# Patient Record
Sex: Female | Born: 1940
Health system: Southern US, Community
[De-identification: ages and names within clinical notes are randomized; demographics above are authoritative.]

## PROBLEM LIST (undated history)

## (undated) ENCOUNTER — Ambulatory Visit: Admission: EM | Payer: PPO

## (undated) DIAGNOSIS — N950 Postmenopausal bleeding: Secondary | ICD-10-CM

## (undated) DIAGNOSIS — J40 Bronchitis, not specified as acute or chronic: Secondary | ICD-10-CM

## (undated) DIAGNOSIS — K219 Gastro-esophageal reflux disease without esophagitis: Secondary | ICD-10-CM

## (undated) DIAGNOSIS — I480 Paroxysmal atrial fibrillation: Secondary | ICD-10-CM

## (undated) DIAGNOSIS — D649 Anemia, unspecified: Secondary | ICD-10-CM

## (undated) DIAGNOSIS — Z8639 Personal history of other endocrine, nutritional and metabolic disease: Secondary | ICD-10-CM

## (undated) DIAGNOSIS — N390 Urinary tract infection, site not specified: Secondary | ICD-10-CM

## (undated) DIAGNOSIS — Z972 Presence of dental prosthetic device (complete) (partial): Secondary | ICD-10-CM

## (undated) DIAGNOSIS — E039 Hypothyroidism, unspecified: Secondary | ICD-10-CM

## (undated) DIAGNOSIS — D509 Iron deficiency anemia, unspecified: Secondary | ICD-10-CM

## (undated) DIAGNOSIS — M62830 Muscle spasm of back: Secondary | ICD-10-CM

## (undated) DIAGNOSIS — M1990 Unspecified osteoarthritis, unspecified site: Secondary | ICD-10-CM

## (undated) DIAGNOSIS — I517 Cardiomegaly: Secondary | ICD-10-CM

## (undated) DIAGNOSIS — R0600 Dyspnea, unspecified: Secondary | ICD-10-CM

## (undated) DIAGNOSIS — R630 Anorexia: Secondary | ICD-10-CM

## (undated) DIAGNOSIS — I10 Essential (primary) hypertension: Secondary | ICD-10-CM

## (undated) DIAGNOSIS — Z7901 Long term (current) use of anticoagulants: Secondary | ICD-10-CM

## (undated) DIAGNOSIS — E079 Disorder of thyroid, unspecified: Secondary | ICD-10-CM

## (undated) DIAGNOSIS — J189 Pneumonia, unspecified organism: Secondary | ICD-10-CM

## (undated) HISTORY — DX: Cardiomegaly: I51.7

## (undated) HISTORY — PX: EYE SURGERY: SHX253

## (undated) HISTORY — PX: MULTIPLE TOOTH EXTRACTIONS: SHX2053

## (undated) HISTORY — PX: DILATION AND CURETTAGE OF UTERUS: SHX78

## (undated) HISTORY — DX: Personal history of other endocrine, nutritional and metabolic disease: Z86.39

## (undated) HISTORY — DX: Paroxysmal atrial fibrillation: I48.0

---

## 2004-10-13 ENCOUNTER — Ambulatory Visit: Payer: Self-pay | Admitting: Cardiology

## 2005-09-30 ENCOUNTER — Emergency Department (HOSPITAL_COMMUNITY): Admission: EM | Admit: 2005-09-30 | Discharge: 2005-09-30 | Payer: Self-pay | Admitting: Emergency Medicine

## 2006-02-19 ENCOUNTER — Ambulatory Visit: Payer: Self-pay | Admitting: Cardiology

## 2006-02-20 ENCOUNTER — Ambulatory Visit: Payer: Self-pay | Admitting: Cardiology

## 2006-05-20 ENCOUNTER — Ambulatory Visit: Payer: Self-pay | Admitting: Cardiology

## 2006-05-28 ENCOUNTER — Ambulatory Visit: Payer: Self-pay | Admitting: Cardiology

## 2006-06-27 ENCOUNTER — Ambulatory Visit: Payer: Self-pay | Admitting: Cardiology

## 2006-07-12 ENCOUNTER — Ambulatory Visit: Payer: Self-pay | Admitting: Cardiology

## 2006-08-20 ENCOUNTER — Ambulatory Visit: Payer: Self-pay | Admitting: Cardiology

## 2006-09-03 ENCOUNTER — Ambulatory Visit: Payer: Self-pay | Admitting: Cardiology

## 2006-09-09 ENCOUNTER — Ambulatory Visit: Payer: Self-pay | Admitting: Cardiology

## 2006-09-18 ENCOUNTER — Ambulatory Visit: Payer: Self-pay | Admitting: Cardiology

## 2007-07-21 ENCOUNTER — Ambulatory Visit: Payer: Self-pay | Admitting: Cardiology

## 2008-08-04 ENCOUNTER — Encounter: Payer: Self-pay | Admitting: Cardiology

## 2008-08-04 ENCOUNTER — Ambulatory Visit: Payer: Self-pay | Admitting: Cardiology

## 2008-08-04 DIAGNOSIS — E039 Hypothyroidism, unspecified: Secondary | ICD-10-CM

## 2008-08-04 DIAGNOSIS — I4891 Unspecified atrial fibrillation: Secondary | ICD-10-CM

## 2008-08-11 ENCOUNTER — Ambulatory Visit: Payer: Self-pay | Admitting: Cardiology

## 2008-08-20 ENCOUNTER — Encounter: Payer: Self-pay | Admitting: Cardiology

## 2008-08-20 ENCOUNTER — Ambulatory Visit: Payer: Self-pay | Admitting: Cardiology

## 2008-08-20 DIAGNOSIS — H9319 Tinnitus, unspecified ear: Secondary | ICD-10-CM | POA: Insufficient documentation

## 2009-04-22 ENCOUNTER — Encounter: Payer: Self-pay | Admitting: Cardiology

## 2009-04-22 ENCOUNTER — Ambulatory Visit: Payer: Self-pay | Admitting: Cardiology

## 2011-02-06 NOTE — Assessment & Plan Note (Signed)
[facility]                          [facility]   KAMALJIT, HIZER                    MRN:          [id number]  DATE:07/21/2007                            DOB:          [birth year]    HISTORY OF PRESENT ILLNESS:  The patient is a pleasant 70 year old  female with a history of paroxysmal atrial fibrillation.  The patient  was last seen September 03, 2006, at which time we increased her  Pindolol.  Unfortunately, with the increase of Pindolol she developed an  episode of atrial fibrillation.  She was seen in the office.  An EKG was  obtained and confirmed the presence of atrial fibrillation.  The rate  was, however, well-controlled.  The patient then stopped taking her  Pindolol altogether, only later on to resume at a half dose.  She states  she has now been doing well.  She reports no recurrent palpitations.  She actually has had not a single episode since December 2007.  She  denies any chest pain, shortness of breath, orthopnea, PND, and her main  complaint is insomnia as it relates to working a third shift.   MEDICATIONS:  1. Synthroid 0.1 mg p.o. daily.  2. Aspirin 325 mg p.o. daily.  3. Lanoxin 25 mcg p.o. daily.  4. Pindolol 5 mg half a tablet p.o. daily.   PHYSICAL EXAMINATION:  VITAL SIGNS:  Blood pressure 133/75, heart rate  55 beats per minute, weight is 135 pounds.  NECK:  Normal carotid upstroke and no carotid bruits.  LUNGS:  Clear breath sounds bilaterally.  HEART:  Regular rate and rhythm with normal S1-S2, no murmurs, rubs or  gallops.  ABDOMEN:  Soft, nontender.  No rebound or guarding.  Good bowel sounds.  EXTREMITIES:  No cyanosis, clubbing or edema.  NEUROLOGIC:  The patient is alert and oriented and grossly nonfocal.   PROBLEM LIST:  1. Paroxysmal atrial fibrillation.  2. No definite indication for long-term Coumadin anticoagulation      secondary to low CHADS score.  3. Normal left ventricular  function.  4. History of hypothyroidism.   PLAN:  1. The patient can continue on low-dose Pindolol.  She remains in      normal sinus rhythm.  2. At this point there is still no indication for Coumadin, and this      was discussed with the      patient.  3. The patient can follow up with [place] in 6 months.     Learta Codding, MD,FACC  Electronically Signed    GED/MedQ  DD: 07/21/2007  DT: 07/22/2007  Job #: [id number]

## 2011-02-09 NOTE — Assessment & Plan Note (Signed)
[facility]                          [facility]   Debbie Rubio, Debbie Rubio                    MRN:          [id number]  DATE:09/03/2006                            DOB:          1941/02/14    PRESENT ILLNESS:  The patient is a 70 year old female with a history of  paroxysmal mitral fibrillation.  Appeared the patient was last seen in  the office on August 05, 2006.  The patient concerns regarding side  effects of medications, __________ of hypotension secondary to beta  blockers and diltiazem.  A recommendation was given at that time of the  office visit to change to Bentylol half a tablet p.o. b.i.d.  The  patient is taking 2.5 mg once a day.  She has had marked improvement in  her symptoms.  She states she has absolutely had no palpitations in the  mornings, and has occasional palpitations in the evenings, but nothing  prolonged enough to make her concerned.   She reports no chest pain, orthopnea, or syncope.   1. Synthroid 0.1 mg p.o. daily.  2. Ditropan, aspirin 825 p.o. daily.  3. Bentylol 2.5 mg half a tablet daily.  4. Lanoxin 25 mcg p.o. daily.   PHYSICAL EXAMINATION:  Blood pressure 122/71, heart rate 62 beats per  minute.  Weight is 127 pounds.  NECK:  Normal carotid upstrokes.  No carotid bruits.  LUNGS:  Clear breath sounds bilaterally.  HEART:  Regular rate and rhythm, normal S1, S2.  No murmurs, rubs, or  gallops.  GENERAL:  Well-nourished white female in no acute distress.  HEENT:  Normal, no carotid bruits.  ABDOMEN:  Soft.  EXTREMITIES: No edema.  NEURO:  Patient is nonfocal.   PROBLEMS:  1. Paroxysmal atrial fibrillation, acquiescent on Bentylol therapy.  2. No definite indication for long term Coumadin anticoagulation.  3. Normal left ventricular function.  4. History of hypothyroidism.   PLAN:  1. The patient is doing well from an arrhythmia standpoint.  She has      no recurrent palpitations      of  any significance.  2. The patient is now willing to increase her Bentylol to 2.5 mg p.o.      b.i.d.     Learta Codding, MD,FACC  Electronically Signed    GED/MedQ  DD: 09/03/2006  DT: 09/03/2006  Job #: [id number]   cc:   Donzetta Sprung

## 2011-02-09 NOTE — Assessment & Plan Note (Signed)
[facility]                            [facility]   Debbie Rubio, Debbie Rubio                    MRN:          [id number]  DATE:06/27/2006                            DOB:          1941-01-15    PRIMARY CARDIOLOGIST:  Dr. Nona Dell.   REASON FOR OFFICE VISIT:  Scheduled office follow up. Please refer to  Carolynne Edouard' office note of August 27th for full details.   In summary, Debbie Rubio returns after recent clinic follow up at which time  she was referred for a 2D echocardiogram and a digoxin level.  The  echocardiogram showed continued normal left ventricular function with only  mild mitral regurgitation; no wall motion abnormalities noted.   The patient has a history of documented paroxysmal atrial fibrillation, but  was in NSR when seen here in the clinic.   The patient continues to report occasional episodes of palpitations and, in  fact, has had two since this recent clinic visit. In fact, her second  episode led her to returning to [facility] ER on Saturday, September  29th. However, upon arrival she had already reverted back to normal sinus  rhythm.   The patient does not state that these are of the fluttering sensation, but  more of an irregular heart beat. As noted, she has had documented PAF in the  past and, in fact, was treated with propafenone 600 mg in September, 2005.  She apparently was also treated with diltiazem at that time, but was not  discharged on this presumably secondary to hypotension.   The patient also reports intolerance to beta blockers, stating that this  also drops her blood pressure.  The patient does have treated hypothyroidism and states that her TSH levels  have been normal, and are closely followed by Dr. Reuel Boom.   During her recent ER visit, all blood work was within normal limits,  including a digoxin level of 1.2. The patient does not drink caffeinated  beverages.   CURRENT MEDICATIONS:  1. Synthroid 0.1 daily.  2. Ditropan XL 10 daily.  3. Enteric coated aspirin 325 daily.  4. Lanoxin 0.25 daily.   PHYSICAL EXAMINATION:  VITAL SIGNS:  Blood pressure 110/54, pulse 60,  regular. Weight 124.  NECK:  Palpable bilateral carotid pulses without bruits.  LUNGS:  Clear to auscultation in all fields.  HEART:  Regular rate and rhythm (S1, S2) no murmurs, rubs, or gallops.  EXTREMITIES:  Intact pulses without edema.  NEURO:  No focal deficit.   IMPRESSION:  1. History of paroxysmal atrial fibrillation with rapid ventricular      response.      a.     Previously successfully treated with propafenone.      b.     Intolerant to beta blockers and diltiazem, presumably secondary       to associated hypotension.      c.     Documented PAF by Holter monitoring, May, 2007.  2. Normal left ventricular function.      a.     Mild mitral regurgitation.  3. Treated hypothyroidism.   PLAN:  I reviewed Ms. Huizinga's past history with Dr. Andee Lineman, including  most recent presentation to the ER for recurrent palpitations at which time  she was already back in normal sinus rhythm.  We also reviewed her recent  echocardiogram, again read by Dr. Andee Lineman, showing continued preserved left  ventricular function.  All of her recent blood work was also within normal  limits.   RECOMMENDATIONS:  Therefore recommendation is to re-challenge her with  calcium channel blocker at a low dose of 120 daily. This will be in addition  to the digoxin that she is currently on. Will also place her on a CardioNet  monitor for the next 3 weeks to see how her heart rate responds on the added  medication. If she continues to have breakthrough tachy palpitations or  documented atrial fibrillation, then we will consider her for long term  propafenone treatment. However, she will then need an ischemic work up,  which she has never had in the past.      ______________________________  Rozell Searing, PA-C    ______________________________  Learta Codding, MD,FACC    GS/MedQ  DD:  06/27/2006  DT:  06/28/2006  Job #:  [id number]   cc:   Jonelle Sidle, MD

## 2011-02-09 NOTE — Assessment & Plan Note (Signed)
[facility]                          [facility]   SHAQUANDRA, GALANO                    MRN:          [id number]  DATE:08/20/2006                            DOB:          Nov 11, 1940    REASON FOR OFFICE VISIT:  Scheduled follow up.  Please refer to my  office note of October 4 for full details.   At that time, the patient presented in follow up of further workup  consisting of an echocardiogram and blood work.  The echocardiogram  revealed normal left ventricular function with mild mitral regurgitation  and no wall motion abnormalities.  The patient also had a digoxin level  drawn which was normal.  This was all with respect to long-standing  history of palpitations with previously documented PAF, but with no  indication for long-term treatment with Coumadin.  At the time of our  last visit, we recommended re-challenging the patient with Cardizem for  better management of her break through palpitations.  However, she never  did start the Cardizem 120 daily given her significant anxiety related  to this medication in light of reportedly adverse affect to this  medication when she was previously here in the hospital.  She still  feels quite strongly that she had a significant hypertensive reaction to  calcium channel blocker in the past.  However, both at the time of her  last visit as well as currently, I reviewed her chart extensively and  did find an indication of hypotension, but this may, in fact, have been  related to her being treated with a one time dose of propafenone in  September 2005.  It remains unclear to me as to exactly what caused the  drop in blood pressure.  It is, however, clear that she did have  relative hypotension and was not discharged on a calcium channel blocker  nor on a beta blocker.   We also recommended she be placed on a cardiac event monitor for  reassessment of her break through tachy  palpitations and to rule out  recurrent PAF.  Of note, she did have documented PAF by Holter  monitoring in May of this year.  The CardioNet results have been  reviewed.  They reveal only two brief runs of PSVT at a rate of 105  which then converted to sinus bradycardia with PACs.  She, otherwise,  remains in sinus brady/normal sinus rhythm with rates as low as 36 and  as high as 105.   From a clinical standpoint, she reports no significant associated  symptoms during the period that she was on the CardioNet monitor.   CURRENT MEDICATIONS:  Lanoxin 0.25 daily, coated aspirin 325 daily,  Synthroid 0.1 daily, Ditropan XL 10 daily.   PHYSICAL EXAMINATION:  VITAL SIGNS:  Blood pressure 122/71, pulse 52 and regular, weight 128.  GENERAL:  70 year old female sitting upright in no apparent distress.  HEENT:  Normocephalic, atraumatic.  NECK:  Palpable bilateral carotid pulses without bruits.  LUNGS:  Clear to auscultation in all fields.  HEART:  Regular rate and rhythm (S1 and S2), no murmurs, gallops, and  rubs.  ABDOMEN:  Soft, nontender.  EXTREMITIES:  Intact distal pulses without edema.  NEUROLOGICAL:  No focal deficit.   IMPRESSION:  1. Recurrent tachy palpitations.      a.     Mild dissociated symptoms.      b.     History of documented PAF by Holter monitor in May 2007.      c.     No indication for long term Coumadin anticoagulation.      d.     Status post successful conversion of PAF to NSR after       treatment with propafenone in September 2005.      e.     Intolerant to beta blockers and Diltiazem secondary to       relative hypotension.  2. Normal left ventricular function.      a.     Mild mitral regurgitation by current echocardiogram.  3. Treated hypothyroidism.   PLAN:  Following extensive review with Dr. Andee Lineman regarding the  patient's past history of documented PAF, recurrent occasional tachy  palpitations, current CardioNet monitor results, and in particularly  in  light of her sensitivity to calcium channel and beta blockers, the  current recommendation is to try a beta blocker with ISA properties.  Therefore, we will add Pindolol at the low dose of 2.5 mg initially to  be taken once a day.  If the patient tolerates this for 3-4 days, she  can then increase it to b.i.d. dosing.  This is to be added to her  current regimen which includes Lanoxin.  We will plan on having her  return to our clinic in two weeks for close monitoring and further  recommendations.  If, however, the patient is unable to tolerate this  medication, then we will consider referring her to our electrophysiology  team for further evaluation and recommendations.      Gene Serpe, PA-C  Electronically Signed      Learta Codding, MD,FACC  Electronically Signed   GS/MedQ  DD: 08/20/2006  DT: 08/20/2006  Job #: [id number]   cc:   Donzetta Sprung

## 2011-02-09 NOTE — Assessment & Plan Note (Signed)
[facility]                            [facility]   SHAUGHNESSY, GETHERS                    MRN:          [id number]  DATE:05/20/2006                            DOB:          19-Jul-1941    REASON FOR VISIT:  Follow-up atrial fibrillation.   HISTORY OF PRESENT ILLNESS:  Debbie Rubio is a very pleasant 70 year old  Caucasian female with known history of paroxysmal atrial fibrillation with  rapid ventricular response.  We initially saw her back in September of 2005  for this reason.  She was admitted to [facility] at that  time and treated with propafenone 600 mg.  The patient converted to normal  sinus rhythm.  She did experience some hypotension and had to be fluid  bolused.  They considered at that time putting her on a calcium channel  blocker or beta blocker, but the patient's blood pressure was too low and  she did not tolerate either of these medications.  Dr. Nona Dell did  not feel the patient needed Coumadin at that time, as she had no  hypertension, diabetes, congestive heart failure, strokes, and she was less  than 29 years old.  She was placed on aspirin daily, encouraged to stay off  caffeine, and to avoid Sudafed-type products.  It was felt if she had  recurrent atrial fibrillation, she may need electrophysiology study and  possible ablation.  Debbie Rubio was seen since that time in January of  2006 for further follow-up.  She had no major recurrences of atrial  fibrillation at that time, and the patient was instructed to follow up as  needed.  Apparently since that time (there are no office notes available),  the patient was ordered a Holter monitor by Burlene Arnt in May of this year.  Holter monitor showed sinus rhythm with periods of atrial fibrillation noted  with spontaneous conversion to normal sinus rhythm.  The patient was also  found to have PVC and some SVT noted in the form of  singles, pairs, and  atrial runs.  The patient states she has been doing well since that time  until July when she was out of town on vacation and states that she had what  felt like a burst of atrial fibrillation on March 27, 2006, and again on April 04, 2006.  She states she had not consumed any caffeine or Sudafed-type  products.  She had consumed a very small amount of alcohol.  Apparently the  irregular rate did not last more than 30 minutes.  The patient states she  laid down and rested and it resolved spontaneously.  She returns today for  further evaluation.  She has denied any episodes of chest discomfort,  presyncope or syncope, lightheadedness or dizziness.  The only time she felt  anything was on March 27, 2006, and April 04, 2006, when she felt like my  whole body was shaking with the atrial fibrillation.  She did not seek  medical assistance at that time.  She denies any episodes of orthopnea, PND,  peripheral edema.  Debbie Rubio is  a very active 70 year old female.  She  states she is very sensitive to medications and has been all her life and  does not tolerate high doses of any medications.  She also has a history of  hypothyroidism and is maintained on Synthroid 0.1 mg daily.  She is  maintained on Lanoxin 25 mcg daily for her atrial fibrillation.   REVIEW OF SYSTEMS:  As stated above, otherwise negative.   MEDICATIONS:  1. Synthroid 0.1 mg.  2. Ditropan XL 10 mg.  3. Aspirin 325.  4. Lanoxin 25 mcg.  5. Ultram p.r.n.   PAST MEDICAL HISTORY:  Past medical history includes:  1. Paroxysmal atrial fibrillation with rapid ventricular response, first      diagnosed in September of 2005.      a.     Echocardiogram at that time showed left ventricular hypertrophy       with preserved LV systolic function.  Biatrial enlargement.  Mild       mitral regurgitation.  2. Hypothyroidism.  3. Urge urinary incontinence.  4. Gastroesophageal reflux disease.  5. Palpitations.  6.  Intolerance to beta blockers, questionable intolerance to calcium      channel blockers.  7. Very remote history of tobacco use.   PHYSICAL EXAMINATION:  VITAL SIGNS:  Blood pressure 110/70, heart rate 74,  weight 127 pounds.  GENERAL:  Patient in no acute distress, very pleasant and cooperative 53-  year-old Caucasian female.  NECK:  Supple without lymphadenopathy, negative bruits, negative JVD.  CHEST:  Clear to auscultation bilaterally.  CARDIOVASCULAR:  S1/S2 regular rate and rhythm.  ABDOMEN:  Soft, nontender.  Positive bowel sounds.  EXTREMITIES:  Lower extremities without clubbing, cyanosis, or edema.  NEUROLOGIC:  The patient is alert and oriented x 3.  Cranial nerves II-XII  grossly intact.   LABORATORY DATA:  Twelve-lead EKG showing sinus bradycardia at a rate of 55  without acute ST or T-wave changes.   IMPRESSION:  1. Paroxysmal atrial fibrillation, 12-lead EKG today showing sinus      bradycardia at a rate of 55, patient currently being maintained on      digoxin only with intolerance to beta blockers and possible intolerance      to calcium channel blockers, per patient.  I cannot find that the      patient has ever had a workup for ischemic disease.  When we saw the      patient in consultation in September of 2005, it was noted that the      patient would be scheduled for an outpatient stress test.  Mrs.      Rubio does not recall this ever being ordered, and I do not have any      results to confirm that one was done.  2-D echocardiogram was done that      showed a normal EF with mild mitral regurgitation.  At this time, Mrs.      Rubio does not want to add any medications to her medical regimen.      She states she has not had any further episodes of palpitations since      July.  I would like to repeat her echocardiogram and she is agreeable      to this.  We will also check a digoxin level and a TSH level.  I do not     think that we would be able to  increase her digoxin as her rate is  running in the 50s now and apparently the patient does not tolerate      beta blockers and does not wish to attempt to try Cardizem again at      this time.  We will repeat her echocardiogram, check her blood work,      and see her back in four weeks.  If she has any further episodes of the      PAF, we can strongly encourage her      to try the Cardizem again.  We also need to consider a stress test,      possible EP evaluation.  However, at this time, she does not want to      proceed with any further testing.                                   Dorian Pod, ACNP                                Learta Codding, MD, [facility]   MB/MedQ  DD:  05/20/2006  DT:  05/21/2006  Job #:  [id number]

## 2012-04-04 ENCOUNTER — Encounter: Payer: Self-pay | Admitting: *Deleted

## 2015-11-23 DIAGNOSIS — J189 Pneumonia, unspecified organism: Secondary | ICD-10-CM

## 2015-11-23 HISTORY — DX: Pneumonia, unspecified organism: J18.9

## 2015-12-02 ENCOUNTER — Inpatient Hospital Stay (HOSPITAL_COMMUNITY)
Admission: EM | Admit: 2015-12-02 | Discharge: 2015-12-04 | DRG: 194 | Disposition: A | Discharge status: Home / Self Care | Payer: PPO | Attending: Internal Medicine | Admitting: Internal Medicine

## 2015-12-02 ENCOUNTER — Emergency Department (HOSPITAL_COMMUNITY): Payer: PPO

## 2015-12-02 ENCOUNTER — Encounter (HOSPITAL_COMMUNITY): Payer: Self-pay | Admitting: Emergency Medicine

## 2015-12-02 DIAGNOSIS — Z7951 Long term (current) use of inhaled steroids: Secondary | ICD-10-CM

## 2015-12-02 DIAGNOSIS — E876 Hypokalemia: Secondary | ICD-10-CM | POA: Diagnosis not present

## 2015-12-02 DIAGNOSIS — Z7982 Long term (current) use of aspirin: Secondary | ICD-10-CM | POA: Diagnosis not present

## 2015-12-02 DIAGNOSIS — E039 Hypothyroidism, unspecified: Secondary | ICD-10-CM | POA: Diagnosis not present

## 2015-12-02 DIAGNOSIS — J101 Influenza due to other identified influenza virus with other respiratory manifestations: Secondary | ICD-10-CM | POA: Diagnosis present

## 2015-12-02 DIAGNOSIS — R509 Fever, unspecified: Secondary | ICD-10-CM | POA: Diagnosis not present

## 2015-12-02 DIAGNOSIS — Z8249 Family history of ischemic heart disease and other diseases of the circulatory system: Secondary | ICD-10-CM | POA: Diagnosis not present

## 2015-12-02 DIAGNOSIS — R778 Other specified abnormalities of plasma proteins: Secondary | ICD-10-CM | POA: Diagnosis present

## 2015-12-02 DIAGNOSIS — R7989 Other specified abnormal findings of blood chemistry: Secondary | ICD-10-CM | POA: Diagnosis present

## 2015-12-02 DIAGNOSIS — J189 Pneumonia, unspecified organism: Secondary | ICD-10-CM | POA: Diagnosis not present

## 2015-12-02 DIAGNOSIS — R6883 Chills (without fever): Secondary | ICD-10-CM | POA: Diagnosis not present

## 2015-12-02 DIAGNOSIS — I10 Essential (primary) hypertension: Secondary | ICD-10-CM | POA: Diagnosis present

## 2015-12-02 DIAGNOSIS — R05 Cough: Secondary | ICD-10-CM | POA: Diagnosis not present

## 2015-12-02 DIAGNOSIS — I480 Paroxysmal atrial fibrillation: Secondary | ICD-10-CM | POA: Diagnosis not present

## 2015-12-02 DIAGNOSIS — J1000 Influenza due to other identified influenza virus with unspecified type of pneumonia: Principal | ICD-10-CM | POA: Diagnosis present

## 2015-12-02 DIAGNOSIS — I4891 Unspecified atrial fibrillation: Secondary | ICD-10-CM | POA: Diagnosis not present

## 2015-12-02 DIAGNOSIS — N179 Acute kidney failure, unspecified: Secondary | ICD-10-CM | POA: Diagnosis not present

## 2015-12-02 DIAGNOSIS — I482 Chronic atrial fibrillation: Secondary | ICD-10-CM | POA: Diagnosis present

## 2015-12-02 DIAGNOSIS — I248 Other forms of acute ischemic heart disease: Secondary | ICD-10-CM | POA: Diagnosis not present

## 2015-12-02 DIAGNOSIS — J989 Respiratory disorder, unspecified: Secondary | ICD-10-CM

## 2015-12-02 HISTORY — DX: Essential (primary) hypertension: I10

## 2015-12-02 LAB — COMPREHENSIVE METABOLIC PANEL
ALK PHOS: 59 U/L (ref 38–126)
ALT: 35 U/L (ref 14–54)
AST: 39 U/L (ref 15–41)
Albumin: 3.5 g/dL (ref 3.5–5.0)
Anion gap: 10 (ref 5–15)
BUN: 29 mg/dL — AB (ref 6–20)
CALCIUM: 8.5 mg/dL — AB (ref 8.9–10.3)
CO2: 26 mmol/L (ref 22–32)
CREATININE: 1.2 mg/dL — AB (ref 0.44–1.00)
Chloride: 98 mmol/L — ABNORMAL LOW (ref 101–111)
GFR, EST AFRICAN AMERICAN: 51 mL/min — AB (ref >60)
GFR, EST NON AFRICAN AMERICAN: 44 mL/min — AB (ref >60)
Glucose, Bld: 110 mg/dL — ABNORMAL HIGH (ref 65–99)
Potassium: 3.1 mmol/L — ABNORMAL LOW (ref 3.5–5.1)
Sodium: 134 mmol/L — ABNORMAL LOW (ref 135–145)
Total Bilirubin: 2.2 mg/dL — ABNORMAL HIGH (ref 0.3–1.2)
Total Protein: 7.8 g/dL (ref 6.5–8.1)

## 2015-12-02 LAB — URINALYSIS, ROUTINE W REFLEX MICROSCOPIC
Bilirubin Urine: NEGATIVE
Glucose, UA: NEGATIVE mg/dL
Ketones, ur: NEGATIVE mg/dL
LEUKOCYTES UA: NEGATIVE
NITRITE: NEGATIVE
SPECIFIC GRAVITY, URINE: 1.015 (ref 1.005–1.030)
pH: 5.5 (ref 5.0–8.0)

## 2015-12-02 LAB — CBC WITH DIFFERENTIAL/PLATELET
BASOS ABS: 0 10*3/uL (ref 0.0–0.1)
Basophils Relative: 0 %
Eosinophils Absolute: 0 10*3/uL (ref 0.0–0.7)
Eosinophils Relative: 0 %
HCT: 40.5 % (ref 36.0–46.0)
HEMOGLOBIN: 13.4 g/dL (ref 12.0–15.0)
LYMPHS ABS: 1 10*3/uL (ref 0.7–4.0)
LYMPHS PCT: 9 %
MCH: 28.3 pg (ref 26.0–34.0)
MCHC: 33.1 g/dL (ref 30.0–36.0)
MCV: 85.4 fL (ref 78.0–100.0)
Monocytes Absolute: 1.3 10*3/uL — ABNORMAL HIGH (ref 0.1–1.0)
Monocytes Relative: 11 %
NEUTROS ABS: 9.2 10*3/uL — AB (ref 1.7–7.7)
NEUTROS PCT: 80 %
Platelets: 226 10*3/uL (ref 150–400)
RBC: 4.74 MIL/uL (ref 3.87–5.11)
RDW: 15.1 % (ref 11.5–15.5)
WBC: 11.6 10*3/uL — AB (ref 4.0–10.5)

## 2015-12-02 LAB — LIPASE, BLOOD: Lipase: 25 U/L (ref 11–51)

## 2015-12-02 LAB — URINE MICROSCOPIC-ADD ON: RBC / HPF: NONE SEEN RBC/hpf (ref 0–5)

## 2015-12-02 LAB — DIGOXIN LEVEL: DIGOXIN LVL: 0.9 ng/mL (ref 0.8–2.0)

## 2015-12-02 LAB — INFLUENZA PANEL BY PCR (TYPE A & B)
H1N1 flu by pcr: NOT DETECTED
INFLAPCR: NEGATIVE
INFLBPCR: POSITIVE — AB

## 2015-12-02 LAB — LACTIC ACID, PLASMA
LACTIC ACID, VENOUS: 1.8 mmol/L (ref 0.5–2.0)
LACTIC ACID, VENOUS: 1.4 mmol/L (ref 0.5–2.0)

## 2015-12-02 LAB — TROPONIN I
TROPONIN I: 0.04 ng/mL — AB (ref <0.031)
Troponin I: 0.04 ng/mL — ABNORMAL HIGH (ref <0.031)

## 2015-12-02 MED ORDER — ACETAMINOPHEN 325 MG PO TABS: 650.0000 mg | ORAL_TABLET | Freq: Four times a day (QID) | ORAL | Status: DC | PRN

## 2015-12-02 MED ORDER — CEFTRIAXONE SODIUM 1 G IJ SOLR
1.0000 g | INTRAMUSCULAR | Status: DC
  Administered 2015-12-03: 1 g via INTRAVENOUS
  Filled 2015-12-02 (×2): qty 10

## 2015-12-02 MED ORDER — DEXTROSE 5 % IV SOLN
1.0000 g | Freq: Once | INTRAVENOUS | Status: AC
  Administered 2015-12-02: 1 g via INTRAVENOUS
  Filled 2015-12-02: qty 10

## 2015-12-02 MED ORDER — OSELTAMIVIR PHOSPHATE 6 MG/ML PO SUSR
ORAL | Status: AC
  Filled 2015-12-02: qty 1

## 2015-12-02 MED ORDER — PINDOLOL 5 MG PO TABS
2.5000 mg | ORAL_TABLET | Freq: Every day | ORAL | Status: DC
  Administered 2015-12-02 – 2015-12-04 (×3): 2.5 mg via ORAL
  Filled 2015-12-02 (×4): qty 1

## 2015-12-02 MED ORDER — DEXTROSE 5 % IV SOLN
500.0000 mg | Freq: Once | INTRAVENOUS | Status: AC
  Administered 2015-12-02: 500 mg via INTRAVENOUS
  Filled 2015-12-02: qty 500

## 2015-12-02 MED ORDER — SODIUM CHLORIDE 0.9 % IV BOLUS (SEPSIS)
1000.0000 mL | Freq: Once | INTRAVENOUS | Status: AC
  Administered 2015-12-02: 1000 mL via INTRAVENOUS

## 2015-12-02 MED ORDER — ONDANSETRON HCL 4 MG PO TABS: 4.0000 mg | ORAL_TABLET | Freq: Four times a day (QID) | ORAL | Status: DC | PRN

## 2015-12-02 MED ORDER — LOSARTAN POTASSIUM 50 MG PO TABS
100.0000 mg | ORAL_TABLET | Freq: Every day | ORAL | Status: DC
  Administered 2015-12-02 – 2015-12-04 (×3): 100 mg via ORAL
  Filled 2015-12-02 (×3): qty 2

## 2015-12-02 MED ORDER — ACETAMINOPHEN 325 MG PO TABS
650.0000 mg | ORAL_TABLET | Freq: Once | ORAL | Status: AC
  Administered 2015-12-02: 650 mg via ORAL
  Filled 2015-12-02: qty 2

## 2015-12-02 MED ORDER — ENOXAPARIN SODIUM 40 MG/0.4ML ~~LOC~~ SOLN: 40.0000 mg | SUBCUTANEOUS | Status: DC

## 2015-12-02 MED ORDER — POTASSIUM CHLORIDE 10 MEQ/100ML IV SOLN: 10.0000 meq | INTRAVENOUS | Status: DC

## 2015-12-02 MED ORDER — LEVOTHYROXINE SODIUM 25 MCG PO TABS
125.0000 ug | ORAL_TABLET | Freq: Every day | ORAL | Status: DC
  Administered 2015-12-03 – 2015-12-04 (×2): 125 ug via ORAL
  Filled 2015-12-02 (×3): qty 1

## 2015-12-02 MED ORDER — ENOXAPARIN SODIUM 40 MG/0.4ML ~~LOC~~ SOLN
40.0000 mg | SUBCUTANEOUS | Status: DC
  Administered 2015-12-03: 40 mg via SUBCUTANEOUS
  Filled 2015-12-02 (×2): qty 0.4

## 2015-12-02 MED ORDER — OSELTAMIVIR PHOSPHATE 6 MG/ML PO SUSR
30.0000 mg | Freq: Two times a day (BID) | ORAL | Status: DC
  Administered 2015-12-02: 30 mg via ORAL
  Filled 2015-12-02 (×6): qty 5

## 2015-12-02 MED ORDER — ASPIRIN 81 MG PO CHEW
81.0000 mg | CHEWABLE_TABLET | Freq: Every day | ORAL | Status: DC
  Administered 2015-12-03 – 2015-12-04 (×2): 81 mg via ORAL
  Filled 2015-12-02 (×2): qty 1

## 2015-12-02 MED ORDER — AZITHROMYCIN 250 MG PO TABS
500.0000 mg | ORAL_TABLET | ORAL | Status: DC
  Administered 2015-12-03: 500 mg via ORAL
  Filled 2015-12-02: qty 2

## 2015-12-02 MED ORDER — ONDANSETRON HCL 4 MG/2ML IJ SOLN: 4.0000 mg | Freq: Four times a day (QID) | INTRAMUSCULAR | Status: DC | PRN

## 2015-12-02 MED ORDER — SODIUM CHLORIDE 0.9 % IV SOLN
INTRAVENOUS | Status: DC
Start: 2015-12-02 — End: 2015-12-04
  Administered 2015-12-02 – 2015-12-04 (×3): via INTRAVENOUS

## 2015-12-02 MED ORDER — SODIUM CHLORIDE 0.9% FLUSH
3.0000 mL | Freq: Two times a day (BID) | INTRAVENOUS | Status: DC
  Administered 2015-12-02 – 2015-12-03 (×2): 3 mL via INTRAVENOUS

## 2015-12-02 MED ORDER — ACETAMINOPHEN 650 MG RE SUPP
650.0000 mg | Freq: Four times a day (QID) | RECTAL | Status: DC | PRN
Start: 2015-12-02 — End: 2015-12-04

## 2015-12-02 MED ORDER — POTASSIUM CHLORIDE 10 MEQ/100ML IV SOLN
10.0000 meq | INTRAVENOUS | Status: AC
  Administered 2015-12-02 – 2015-12-03 (×4): 10 meq via INTRAVENOUS
  Filled 2015-12-02 (×2): qty 100

## 2015-12-02 MED ORDER — DIGOXIN 125 MCG PO TABS
250.0000 ug | ORAL_TABLET | Freq: Every day | ORAL | Status: DC
  Administered 2015-12-02 – 2015-12-04 (×3): 250 ug via ORAL
  Filled 2015-12-02 (×2): qty 2
  Filled 2015-12-02 (×4): qty 1
  Filled 2015-12-02: qty 2

## 2015-12-02 MED ORDER — OXYCODONE HCL 5 MG PO TABS: 5.0000 mg | ORAL_TABLET | ORAL | Status: DC | PRN

## 2015-12-02 NOTE — ED Notes (Signed)
PT c/o n/v/d with generalized weakness, fever, cough body aches x3 days. PT fever 102 in triage. PT states no food intake x3 days but able to tolerate water by mouth.

## 2015-12-02 NOTE — H&P (Addendum)
[facility] History and Physical  Debbie Rubio [id number] DOB: [birth year] DOA: 12/02/2015  Referring physician: Dr Alvino Chapel - [facility] PCP: Gar Ponto, MD   Chief Complaint: chills, myalgia, abd pain,   HPI: Debbie Rubio is a 75 y.o. female  Pred [place] history of abdominal pain, chills, myalgias, diarrhea. Associated with intermittent sore throat. Symptoms fairly constant with waxing and waning nature. Productive cough with sputum. Multiple sick contacts recently at work and home. Patient states that she's never taken her temperature was felt feverish at times intermittently with chills. The get her flu shot in October. Has not taken anything for her symptoms recently. Symptoms are getting worse.  Review of Systems:  Constitutional:  No weight loss, night sweats,  HEENT:  No headaches, Difficulty swallowing,Tooth/dental problems Cardio-vascular:  No chest pain, Orthopnea, PND, swelling in lower extremities, anasarca,  GI: Pe rHPI Resp: PER HPI Skin:  no rash or lesions.  GU:  no dysuria, change in color of urine, no urgency or frequency. No flank pain.  Musculoskeletal:   No joint pain or swelling. No decreased range of motion. No back pain.  Psych:  No change in mood or affect. No depression or anxiety. No memory loss.  Neuro:  No change in sensation, unilateral strength, or cognitive abilities  All other systems were reviewed and are negative.  Past Medical History  Diagnosis Date  . Paroxysmal atrial fibrillation (HCC)   . H/O: hypothyroidism   . Biatrial enlargement   . Hypertension    Past Surgical History  Procedure Laterality Date  . Dilation and curettage of uterus     Social History:  reports that she has never smoked. She does not have any smokeless tobacco history on file. She reports that she does not drink alcohol or use illicit drugs.  Allergies  Allergen Reactions  . Beta Adrenergic Blockers     REACTION: bradycardia and  hypotension. Pt reports she is sensitive to the high doses, but she can take the lower doses.  . Metoprolol Succinate     REACTION: severe hypotension  . Pseudoephedrine     REACTION: causes tachycardia    Family History  Problem Relation Age of Onset  . Heart attack Father   . Heart disease Mother      Prior to Admission medications   Medication Sig Start Date End Date Taking? Authorizing Provider  acetaminophen (TYLENOL) 500 MG tablet Take 500 mg by mouth every 6 (six) hours as needed.   Yes Historical Provider, MD  albuterol-ipratropium (COMBIVENT) 18-103 MCG/ACT inhaler Inhale 2 puffs into the lungs every 4 (four) hours.   Yes Historical Provider, MD  aspirin 81 MG chewable tablet Chew 81 mg by mouth daily.   Yes Historical Provider, MD  Dermatological Products, [place]. (DERMAREST ROSACEA) CREA Apply 1 application topically daily.   Yes Historical Provider, MD  digoxin (LANOXIN) 0.25 MG tablet Take 250 mcg by mouth daily.   Yes Historical Provider, MD  levothyroxine (SYNTHROID, LEVOTHROID) 100 MCG tablet Take 100 mcg by mouth daily.   Yes Historical Provider, MD  LOSARTAN POTASSIUM PO Take 1 tablet by mouth daily.   Yes Historical Provider, MD  pindolol (VISKEN) 5 MG tablet Take 2.5 mg by mouth daily. One half tablet daily   Yes Historical Provider, MD  sodium chloride (OCEAN) 0.65 % SOLN nasal spray Place 1 spray into both nostrils as needed for congestion.   Yes Historical Provider, MD   Physical Exam: Filed Vitals:   12/02/15 1600 12/02/15 1606  12/02/15 1630 12/02/15 1736  BP: 130/62 130/62 100/89 113/72  Pulse: 86 74 84 73  Temp:      TempSrc:      Resp: 27 22 21 17   Height:      Weight:      SpO2: 97% 100% 96% 98%    Wt Readings from Last 3 Encounters:  12/02/15 73.936 kg (163 lb)  04/22/09 62.483 kg (137 lb 12 oz)  08/20/08 62.596 kg (138 lb)    General: Ill appearing Eyes:  PERRL, EOMI, normal lids, iris ENT:  grossly normal hearing, lips & tongue Neck:  no  LAD, masses or thyromegaly Cardiovascular:  RRR, no m/r/g. No LE edema.  Respiratory: Decreased breath sounds thrughout due to poor effort. Few crackles on R. No wheezing.  Abdomen:  soft, ntnd Skin:  no rash or induration seen on limited exam Musculoskeletal:  grossly normal tone BUE/BLE Psychiatric:  grossly normal mood and affect, speech fluent and appropriate Neurologic:  CN 2-12 grossly intact, moves all extremities in coordinated fashion.          Labs on Admission:  Basic Metabolic Panel:  Recent Labs Lab 12/02/15 1520  NA 134*  K 3.1*  CL 98*  CO2 26  GLUCOSE 110*  BUN 29*  CREATININE 1.20*  CALCIUM 8.5*   Liver Function Tests:  Recent Labs Lab 12/02/15 1520  AST 39  ALT 35  ALKPHOS 59  BILITOT 2.2*  PROT 7.8  ALBUMIN 3.5    Recent Labs Lab 12/02/15 1520  LIPASE 25   No results for input(s): AMMONIA in the last 168 hours. CBC:  Recent Labs Lab 12/02/15 1520  WBC 11.6*  NEUTROABS 9.2*  HGB 13.4  HCT 40.5  MCV 85.4  PLT 226   Cardiac Enzymes:  Recent Labs Lab 12/02/15 1520 12/02/15 1842  TROPONINI 0.04* 0.04*    BNP (last 3 results) No results for input(s): BNP in the last 8760 hours.  ProBNP (last 3 results) No results for input(s): PROBNP in the last 8760 hours.   CREATININE: 1.2 mg/dL ABNORMAL (12/02/15 1520) Estimated creatinine clearance - 42.1 mL/min  CBG: No results for input(s): GLUCAP in the last 168 hours.  Radiological Exams on Admission: Dg Chest 2 View  12/02/2015  CLINICAL DATA:  Cough and fever. EXAM: CHEST - 2 VIEW COMPARISON:  Two-view chest x-ray 08/29/2015. FINDINGS: The heart size is normal. A superior segment right lower lobe pneumonia is now present. The left lung is clear. Atherosclerotic calcifications are present at the aorta. The visualized soft tissues and bony thorax are unremarkable. IMPRESSION: 1. Right lower lobe pneumonia. Electronically Signed   By: San Morelle M.D.   On: 12/02/2015 14:51        Assessment/Plan Active Problems:   Hypothyroidism   ATRIAL FIBRILLATION, PAROXYSMAL   Influenza B   Febrile respiratory illness   Elevated troponin   AKI (acute kidney injury) (HCC)   Hypokalemia   Febrile Respiratory Illness: Suspect Flu but cannot exlude Flu alone (influenza B + on admission). Favor treating both at this time. Pt likely to have little benefit from Tamiflu but again favor treatment given severity of illness w/ elevated troponin. Lactic acid 1.8 - Tele - Tamiflu - CTX, Azithro (Consider DC if workup unremarkable or low suspicion) - procalcitonin, sputum Cx, Legionella and Strep Ag.  Elevated Trop: very mild. 0.04. Likely from cardiac strain from Afib, dehydration and flu illness. No previous cath. Last stress per pt nml. No CP/palp/SOB. EKG w/o sign of  ACS. CHA2DS2-VASc = 4. On ASA 81 alone. No further anticoagulation per pt.  - cycle trop - EKG in am - Tele - Cards consult if elevates remarkably - Continue Dig, pindolol - Dig level - Echo  AKI: Cr 1.2. Possible CKD but no previous labs to compare. Poor oral intake this week - IVF - BMET in am  Hypothyroid: - continue synthroid  HypoK: - mag - KCL  Order sets used: Pneumonia, Afib   Code Status: FULL  DVT Prophylaxis: Lovenox Family Communication: daughters Disposition Plan: Pending Improvement    MERRELL, DAVID J, MD Family Medicine [facility] [email or web address] Password [email or web address]

## 2015-12-02 NOTE — ED Provider Notes (Signed)
CSN: [id number]     Arrival date & time 12/02/15  1334 History   First MD Initiated Contact with Patient 12/02/15 1456     Chief Complaint  Patient presents with  . Emesis     Patient is a 75 y.o. female presenting with vomiting.  Emesis Associated symptoms: abdominal pain, chills, diarrhea and myalgias    patient presents with nausea without vomiting. States she's felt bad since Tuesday with today being Friday. She states she's been weak and had chills. No dysuria. Slight sore throat. She states she feels as if she's had a cold. Slight diarrhea. Mild left-sided abdominal pain. Cough with minimal sputum. She states she works at [employer] so has had exposure to other sick people.  Past Medical History  Diagnosis Date  . Paroxysmal atrial fibrillation (HCC)   . H/O: hypothyroidism   . Biatrial enlargement   . Hypertension    Past Surgical History  Procedure Laterality Date  . Dilation and curettage of uterus     Family History  Problem Relation Age of Onset  . Heart attack Father   . Heart disease Mother    Social History  Substance Use Topics  . Smoking status: Never Smoker   . Smokeless tobacco: None  . Alcohol Use: No   OB History    No data available     Review of Systems  Constitutional: Positive for chills, activity change, appetite change and fatigue.  Respiratory: Positive for cough.   Cardiovascular: Negative for chest pain.  Gastrointestinal: Positive for nausea, abdominal pain and diarrhea. Negative for vomiting.  Genitourinary: Positive for flank pain.  Musculoskeletal: Positive for myalgias. Negative for back pain.  Skin: Negative for rash.  Neurological: Negative for weakness and numbness.      Allergies  Beta adrenergic blockers; Metoprolol succinate; and Pseudoephedrine  Home Medications   Prior to Admission medications   Medication Sig Start Date End Date Taking? Authorizing Provider  acetaminophen (TYLENOL) 500 MG tablet Take 500 mg by mouth  every 6 (six) hours as needed.   Yes Historical Provider, MD  albuterol (PROVENTIL HFA;VENTOLIN HFA) 108 (90 Base) MCG/ACT inhaler Inhale 1-2 puffs into the lungs every 6 (six) hours as needed for wheezing or shortness of breath.   Yes Historical Provider, MD  aspirin 81 MG chewable tablet Chew 81 mg by mouth daily.   Yes Historical Provider, MD  Dermatological Products, [place]. (DERMAREST ROSACEA) CREA Apply 1 application topically daily.   Yes Historical Provider, MD  digoxin (LANOXIN) 0.25 MG tablet Take 250 mcg by mouth daily.   Yes Historical Provider, MD  levothyroxine (SYNTHROID, LEVOTHROID) 125 MCG tablet Take 125 mcg by mouth daily before breakfast.   Yes Historical Provider, MD  losartan (COZAAR) 100 MG tablet Take 100 mg by mouth daily.   Yes Historical Provider, MD  pindolol (VISKEN) 5 MG tablet Take 2.5 mg by mouth daily. One half tablet daily   Yes Historical Provider, MD  sodium chloride (OCEAN) 0.65 % SOLN nasal spray Place 1 spray into both nostrils as needed for congestion.   Yes Historical Provider, MD   BP 113/72 mmHg  Pulse 73  Temp(Src) 97.5 F (36.4 C) (Oral)  Resp 17  Ht 5\' 5"  (1.651 m)  Wt 163 lb (73.936 kg)  BMI 27.12 kg/m2  SpO2 98% Physical Exam  Constitutional: She appears well-developed.  Patient appears uncomfortable  Cardiovascular: Normal rate.   Pulmonary/Chest:  Slightly harsh breath sounds.  Abdominal: There is no tenderness.  Musculoskeletal: She exhibits  no edema.  Neurological: She is alert.  Skin: Skin is warm.    ED Course  Procedures (including critical care time) Labs Review Labs Reviewed  URINALYSIS, ROUTINE W REFLEX MICROSCOPIC (NOT AT [facility]) - Abnormal; Notable for the following:    Hgb urine dipstick TRACE (*)    Protein, ur TRACE (*)    All other components within normal limits  CBC WITH DIFFERENTIAL/PLATELET - Abnormal; Notable for the following:    WBC 11.6 (*)    Neutro Abs 9.2 (*)    Monocytes Absolute 1.3 (*)    All other  components within normal limits  COMPREHENSIVE METABOLIC PANEL - Abnormal; Notable for the following:    Sodium 134 (*)    Potassium 3.1 (*)    Chloride 98 (*)    Glucose, Bld 110 (*)    BUN 29 (*)    Creatinine, Ser 1.20 (*)    Calcium 8.5 (*)    Total Bilirubin 2.2 (*)    GFR calc non Af Amer 44 (*)    GFR calc Af Amer 51 (*)    All other components within normal limits  TROPONIN I - Abnormal; Notable for the following:    Troponin I 0.04 (*)    All other components within normal limits  INFLUENZA PANEL BY PCR (TYPE A & B, H1N1) - Abnormal; Notable for the following:    Influenza B By PCR POSITIVE (*)    All other components within normal limits  URINE MICROSCOPIC-ADD ON - Abnormal; Notable for the following:    Squamous Epithelial / LPF 0-5 (*)    Bacteria, UA FEW (*)    All other components within normal limits  TROPONIN I - Abnormal; Notable for the following:    Troponin I 0.04 (*)    All other components within normal limits  URINE CULTURE  CULTURE, BLOOD (ROUTINE X 2)  CULTURE, BLOOD (ROUTINE X 2)  CULTURE, EXPECTORATED SPUTUM-ASSESSMENT  GRAM STAIN  LACTIC ACID, PLASMA  LACTIC ACID, PLASMA  LIPASE, BLOOD  DIGOXIN LEVEL  CBC  BASIC METABOLIC PANEL  TROPONIN I  HIV ANTIBODY (ROUTINE TESTING)  STREP PNEUMONIAE URINARY ANTIGEN  LEGIONELLA ANTIGEN, URINE    Imaging Review Dg Chest 2 View  12/02/2015  CLINICAL DATA:  Cough and fever. EXAM: CHEST - 2 VIEW COMPARISON:  Two-view chest x-ray 08/29/2015. FINDINGS: The heart size is normal. A superior segment right lower lobe pneumonia is now present. The left lung is clear. Atherosclerotic calcifications are present at the aorta. The visualized soft tissues and bony thorax are unremarkable. IMPRESSION: 1. Right lower lobe pneumonia. Electronically Signed   By: San Morelle M.D.   On: 12/02/2015 14:51   I have personally reviewed and evaluated these images and lab results as part of my medical decision-making.    EKG Interpretation   Date/Time:  Friday December 02 2015 14:53:11 EST Ventricular Rate:  79 PR Interval:    QRS Duration: 96 QT Interval:  360 QTC Calculation: 413 R Axis:   58 Text Interpretation:  Atrial fibrillation Borderline low voltage,  extremity leads Minimal ST depression, anterolateral leads Baseline wander  in lead(s) V1 V6 Confirmed by Alvino Chapel  MD, Ovid Curd [phone]) on 12/02/2015  5:10:42 PM      MDM   Final diagnoses:  Influenza B  CAP (community acquired pneumonia)    Patient with fever and cough. Front of pneumonia on x-ray but also flu positive. Will admit to internal medicine.    Davonna Belling, MD 12/02/15 2229

## 2015-12-03 ENCOUNTER — Observation Stay (HOSPITAL_COMMUNITY): Payer: PPO

## 2015-12-03 DIAGNOSIS — E876 Hypokalemia: Secondary | ICD-10-CM | POA: Diagnosis present

## 2015-12-03 DIAGNOSIS — I480 Paroxysmal atrial fibrillation: Secondary | ICD-10-CM | POA: Diagnosis present

## 2015-12-03 DIAGNOSIS — N179 Acute kidney failure, unspecified: Secondary | ICD-10-CM

## 2015-12-03 DIAGNOSIS — J1000 Influenza due to other identified influenza virus with unspecified type of pneumonia: Secondary | ICD-10-CM | POA: Diagnosis present

## 2015-12-03 DIAGNOSIS — Z7951 Long term (current) use of inhaled steroids: Secondary | ICD-10-CM | POA: Diagnosis not present

## 2015-12-03 DIAGNOSIS — I4891 Unspecified atrial fibrillation: Secondary | ICD-10-CM

## 2015-12-03 DIAGNOSIS — J101 Influenza due to other identified influenza virus with other respiratory manifestations: Secondary | ICD-10-CM

## 2015-12-03 DIAGNOSIS — I482 Chronic atrial fibrillation: Secondary | ICD-10-CM

## 2015-12-03 DIAGNOSIS — R6883 Chills (without fever): Secondary | ICD-10-CM | POA: Diagnosis present

## 2015-12-03 DIAGNOSIS — J189 Pneumonia, unspecified organism: Secondary | ICD-10-CM | POA: Diagnosis not present

## 2015-12-03 DIAGNOSIS — R7989 Other specified abnormal findings of blood chemistry: Secondary | ICD-10-CM

## 2015-12-03 DIAGNOSIS — I248 Other forms of acute ischemic heart disease: Secondary | ICD-10-CM | POA: Diagnosis present

## 2015-12-03 DIAGNOSIS — Z7982 Long term (current) use of aspirin: Secondary | ICD-10-CM | POA: Diagnosis not present

## 2015-12-03 DIAGNOSIS — I10 Essential (primary) hypertension: Secondary | ICD-10-CM | POA: Diagnosis present

## 2015-12-03 DIAGNOSIS — E039 Hypothyroidism, unspecified: Secondary | ICD-10-CM | POA: Diagnosis present

## 2015-12-03 DIAGNOSIS — Z8249 Family history of ischemic heart disease and other diseases of the circulatory system: Secondary | ICD-10-CM | POA: Diagnosis not present

## 2015-12-03 LAB — CBC
HEMATOCRIT: 35.7 % — AB (ref 36.0–46.0)
HEMOGLOBIN: 11.6 g/dL — AB (ref 12.0–15.0)
MCH: 27.8 pg (ref 26.0–34.0)
MCHC: 32.5 g/dL (ref 30.0–36.0)
MCV: 85.6 fL (ref 78.0–100.0)
Platelets: 188 10*3/uL (ref 150–400)
RBC: 4.17 MIL/uL (ref 3.87–5.11)
RDW: 15.3 % (ref 11.5–15.5)
WBC: 7 10*3/uL (ref 4.0–10.5)

## 2015-12-03 LAB — ECHOCARDIOGRAM COMPLETE
AORTIC ROOT 2D: 24 mm
CHL CUP MV DEC (S): 169 ms
CHL CUP STROKE VOLUME: 37 mL
CHL CUP SV INDEX: 19.4 mL/m2
CHL CUP TV PEAK RV-RA GRADIENT: 47 cm/s
EWDT: 169 ms
FS: 37 % (ref 28–44)
HEIGHTINCHES: 65 in
IVS/LV PW RATIO, ED: 1.05
LA ID, A-P, ES: 37 mm
LA SIZE INDEX: 1.95 mm/m2
LA VOL 2D INDEX: 25.5 mL/m2
LA VOL 2D: 48.5 mL
LA diam end sys: 37 mm
LA vol index: 25.6 mL/m2
LA vol: 48.6 mL
LV PW d: 8.27 mm — AB (ref 0.6–1.1)
LV PW s: 8.27 mm
LV dias vol: 55 mL (ref 46–106)
LV sys vol index: 10 mL/m2
LV sys vol: 55 mL — AB (ref 14–42)
LVDIAVOLIN: 29 mL/m2
LVIDD: 49 mm — AB (ref 3.5–6.0)
LVIDS: 30.9 mm — AB (ref 2.1–4.0)
MV Peak grad: 6 mmHg
MVPKEVEL: 127 cm/s
RV TAPSE: 20 mm
Simpson's disk: 67
Single Plane A4C EF: 73 %
TRMAXVEL: 344 cm/s
WEIGHTICAEL: 2718.4 [oz_av]

## 2015-12-03 LAB — BASIC METABOLIC PANEL
Anion gap: 7 (ref 5–15)
BUN: 24 mg/dL — AB (ref 6–20)
CHLORIDE: 106 mmol/L (ref 101–111)
CO2: 24 mmol/L (ref 22–32)
Calcium: 7.9 mg/dL — ABNORMAL LOW (ref 8.9–10.3)
Creatinine, Ser: 0.94 mg/dL (ref 0.44–1.00)
GFR calc Af Amer: >60 mL/min (ref >60)
GFR calc non Af Amer: 59 mL/min — ABNORMAL LOW (ref >60)
GLUCOSE: 97 mg/dL (ref 65–99)
POTASSIUM: 3.4 mmol/L — AB (ref 3.5–5.1)
Sodium: 137 mmol/L (ref 135–145)

## 2015-12-03 LAB — STREP PNEUMONIAE URINARY ANTIGEN: STREP PNEUMO URINARY ANTIGEN: NEGATIVE

## 2015-12-03 LAB — TROPONIN I: TROPONIN I: 0.04 ng/mL — AB (ref <0.031)

## 2015-12-03 MED ORDER — OSELTAMIVIR PHOSPHATE 30 MG PO CAPS
30.0000 mg | ORAL_CAPSULE | Freq: Two times a day (BID) | ORAL | Status: DC
  Administered 2015-12-03 – 2015-12-04 (×3): 30 mg via ORAL
  Filled 2015-12-03 (×5): qty 1

## 2015-12-03 NOTE — Progress Notes (Signed)
[facility] PROGRESS NOTE  Debbie Rubio [id number] DOB: May 20, 1941 DOA: 12/02/2015 PCP: Gar Ponto, MD  Assessment/Plan: 1. Influenza B. WBC elevated on admission. Lactic acid was 1.8, but is trending down. Continue to treat with tamiflu and follow blood cx. Continue to monitor on tele. 2. RLL PNA. WBC elevated on admission, now wnl. Lactic acid unremarkable. Start on abx and pulmonary hygiene.  3. Elevated troponin. Likely demand ischemia. She has not had a significant rise in troponins, making ACS unlikely. ECHO has been ordered. She is not having any CP.       4. Chronic Afib. CHA2DS2-VASc 4. Pt currently takes ASA. Rate controlled with BB and Dig. 5. AKI. CKD is possible but there are no prior labs to compare. Creat 1.2 on admission, trending down. Continue IVFs. 6. Hypokalemia. Replaced. Improving 7. Hypothyroidism. Continue synthroid  Code Status: Full DVT prophylaxis: Lovenox Family Communication: no family present at bedside Disposition Plan: anticipate discharge in 1-2 days   Consultants:  none  Procedures:  None  Antibiotics:  Azithro 3/10 >>  Ceftriaxone 3/10 >>  HPI/Subjective: Feels much better than she was yesterday.  Objective: Filed Vitals:   12/02/15 1736 12/02/15 2000  BP: 113/72 125/64  Pulse: 73 82  Temp:  97.5 F (36.4 C)  Resp: 17 24   No intake or output data in the 24 hours ending 12/03/15 0701 Filed Weights   12/02/15 1418 12/02/15 2000  Weight: 73.936 kg (163 lb) 77.066 kg (169 lb 14.4 oz)    Exam:  General: NAD, looks comfortable Cardiovascular: irregular Respiratory: clear bilaterally, No wheezing, rales or rhonchi Abdomen: soft, non tender, no distention , bowel sounds normal Musculoskeletal: No edema b/l  Data Reviewed: Basic Metabolic Panel:  Recent Labs Lab 12/02/15 1520  NA 134*  K 3.1*  CL 98*  CO2 26  GLUCOSE 110*  BUN 29*  CREATININE 1.20*  CALCIUM 8.5*   Liver Function Tests:  Recent  Labs Lab 12/02/15 1520  AST 39  ALT 35  ALKPHOS 59  BILITOT 2.2*  PROT 7.8  ALBUMIN 3.5    Recent Labs Lab 12/02/15 1520  LIPASE 25   No results for input(s): AMMONIA in the last 168 hours. CBC:  Recent Labs Lab 12/02/15 1520  WBC 11.6*  NEUTROABS 9.2*  HGB 13.4  HCT 40.5  MCV 85.4  PLT 226   Cardiac Enzymes:  Recent Labs Lab 12/02/15 1520 12/02/15 1842 12/03/15 0206  TROPONINI 0.04* 0.04* 0.04*   BNP (last 3 results) No results for input(s): BNP in the last 8760 hours.  ProBNP (last 3 results) No results for input(s): PROBNP in the last 8760 hours.  CBG: No results for input(s): GLUCAP in the last 168 hours.  Recent Results (from the past 240 hour(s))  Culture, blood (routine x 2) Call MD if unable to obtain prior to antibiotics being given     Status: None (Preliminary result)   Collection Time: 12/02/15  6:42 PM  Result Value Ref Range Status   Specimen Description BLOOD LEFT HAND  Final   Special Requests BOTTLES DRAWN AEROBIC AND ANAEROBIC 5CC EACH  Final   Culture PENDING  Incomplete   Report Status PENDING  Incomplete  Culture, blood (routine x 2) Call MD if unable to obtain prior to antibiotics being given     Status: None (Preliminary result)   Collection Time: 12/02/15  6:54 PM  Result Value Ref Range Status   Specimen Description LEFT ANTECUBITAL  Final   Special Requests BOTTLES  DRAWN AEROBIC AND ANAEROBIC 4CC EACH  Final   Culture PENDING  Incomplete   Report Status PENDING  Incomplete     Studies: Dg Chest 2 View  12/02/2015  CLINICAL DATA:  Cough and fever. EXAM: CHEST - 2 VIEW COMPARISON:  Two-view chest x-ray 08/29/2015. FINDINGS: The heart size is normal. A superior segment right lower lobe pneumonia is now present. The left lung is clear. Atherosclerotic calcifications are present at the aorta. The visualized soft tissues and bony thorax are unremarkable. IMPRESSION: 1. Right lower lobe pneumonia. Electronically Signed   By:  San Morelle M.D.   On: 12/02/2015 14:51    Scheduled Meds: . aspirin  81 mg Oral Daily  . azithromycin  500 mg Oral Q24H  . cefTRIAXone (ROCEPHIN)  IV  1 g Intravenous Q24H  . digoxin  250 mcg Oral Daily  . enoxaparin (LOVENOX) injection  40 mg Subcutaneous Q24H  . levothyroxine  125 mcg Oral QAC breakfast  . losartan  100 mg Oral Daily  . oseltamivir  30 mg Oral BID  . pindolol  2.5 mg Oral Daily  . sodium chloride flush  3 mL Intravenous Q12H   Continuous Infusions: . sodium chloride 100 mL/hr at 12/02/15 2057    Active Problems:   Hypothyroidism   ATRIAL FIBRILLATION, PAROXYSMAL   Influenza B   Febrile respiratory illness   Elevated troponin   AKI (acute kidney injury) ([place])   Hypokalemia    Time spent: 25 minutes    Kathie Dike, MD. [facility] Pager [phone]. If 7PM-7AM, please contact night-coverage at [email or web address], password [facility] 12/03/2015, 7:01 AM     By signing my name below, I, Delene Ruffini, attest that this documentation has been prepared under the direction and in the presence of Kathie Dike, MD. Electronically Signed: Delene Ruffini  12/03/2015  12:45pm  I, Dr. Kathie Dike, personally performed the services described in this documentaiton. All medical record entries made by the scribe were at my direction and in my presence. I have reviewed the chart and agree that the record reflects my personal performance and is accurate and complete  Kathie Dike, MD, 12/03/2015 1:04 PM

## 2015-12-04 DIAGNOSIS — E876 Hypokalemia: Secondary | ICD-10-CM

## 2015-12-04 DIAGNOSIS — E039 Hypothyroidism, unspecified: Secondary | ICD-10-CM

## 2015-12-04 LAB — HIV ANTIBODY (ROUTINE TESTING W REFLEX): HIV Screen 4th Generation wRfx: NONREACTIVE

## 2015-12-04 LAB — URINE CULTURE

## 2015-12-04 MED ORDER — OSELTAMIVIR PHOSPHATE 30 MG PO CAPS: 30.0000 mg | ORAL_CAPSULE | Freq: Two times a day (BID) | ORAL | Status: DC

## 2015-12-04 MED ORDER — GUAIFENESIN ER 600 MG PO TB12: 600.0000 mg | ORAL_TABLET | Freq: Two times a day (BID) | ORAL | Status: DC

## 2015-12-04 MED ORDER — HYDROCORTISONE NA SUCCINATE PF 100 MG IJ SOLR
100.0000 mg | Freq: Once | INTRAMUSCULAR | Status: AC
  Administered 2015-12-04: 100 mg via INTRAMUSCULAR
  Filled 2015-12-04: qty 2

## 2015-12-04 MED ORDER — AMOXICILLIN-POT CLAVULANATE 875-125 MG PO TABS: 1.0000 | ORAL_TABLET | Freq: Two times a day (BID) | ORAL | Status: DC

## 2015-12-04 NOTE — Progress Notes (Signed)
Pt discharged home today per Dr. Memon. Pt's IV site D/C'd and WDL. Pt's VSS. Pt provided with home medication list, discharge instructions and prescriptions. Verbalized understanding. Pt left floor via WC in stable condition accompanied by NT.  

## 2015-12-04 NOTE — Discharge Summary (Signed)
Physician Discharge Summary  NARY DEMBEK [id number] DOB: 26-Feb-1941 DOA: 12/02/2015  PCP: Gar Ponto, MD  Admit date: 12/02/2015 Discharge date: 12/04/2015  Time spent: 35 minutes  Recommendations for Outpatient Follow-up:  1. Follow up with PCP in 1-2 weeks. 2. Repeat CXR in 3 weeks to ensure resolution of PNA.   Discharge Diagnoses:  Active Problems:   Hypothyroidism   ATRIAL FIBRILLATION, PAROXYSMAL   Influenza B   Febrile respiratory illness   Elevated troponin   AKI (acute kidney injury) ([place])   Hypokalemia   CAP (community acquired pneumonia)   Discharge Condition: Improved  Diet recommendation: Heart healthy  Filed Weights   12/02/15 1418 12/02/15 2000  Weight: 73.936 kg (163 lb) 77.066 kg (169 lb 14.4 oz)    History of present illness:  55 yof presented with  chills, myalgias, a cough productive of sputum, a subjective fever, and a sore throat. She noted multiple sick contacts recently at home and work. While in the ED, labs revealed a mildly elevated troponin and an AKI. She was admitted for further management.   Hospital Course:  Patient presented with a productive cough, subjective fever, and myalgias. She was found to have Influenza B. She was started on Tamiflu with significant improvement. Her WBC and lactic acid are wnl.She is now afebrile and hemodynamically stable. She will be discharged on Tamiflu.   Patient was also noted to have a RLL PNA on CXR. She was started on IV abx and pulmonary hygiene with improvement. Her WBC was elevated on admission however is now wnl. Lactic acid unremarkable. Blood cultures have not had any growth to date. She will be given a dose of steroids prior to discharge for significant wheezing and sent home on oral abx, mucolytics, and bronchodilators. She feels as though her breathing is approaching baseline.   1. Elevated troponin. Likely demand ischemia. She has not had a significant rise in troponins, making ACS  unlikely. ECHO as below. She denied any CP.  2. Chronic Afib. CHA2DS2-VASc 4. Pt currently takes ASA. Rate controlled with BB and Dig. 3. AKI.  Resolved with IVF.   4. Hypokalemia. Replaced. 5. Hypothyroidism. Continue synthroid  Consultants:  none  Procedures:  ECHO Study Conclusions  - Left ventricle: The cavity size was normal. Wall thickness was  normal. Systolic function was normal. The estimated ejection  fraction was in the range of 60% to 65%. Wall motion was normal;  there were no regional wall motion abnormalities. The study is  not technically sufficient to allow evaluation of LV diastolic  function. - Mitral valve: Mildly thickened leaflets . There was mild to  moderate regurgitation. - Left atrium: The atrium was mildly dilated. - Right atrium: The atrium was mildly dilated. Central venous  pressure (est): 8 mm Hg. - Tricuspid valve: There was moderate regurgitation. - Pulmonary arteries: Systolic pressure was moderately increased.  PA peak pressure: 55 mm Hg (S). - Pericardium, extracardiac: There was no pericardial effusion.  Discharge Exam: Filed Vitals:   12/03/15 2053 12/04/15 0532  BP: 126/67 135/59  Pulse: 66 62  Temp: 98.6 F (37 C) 98.4 F (36.9 C)  Resp: 20 20     General: NAD, looks comfortable  Cardiovascular: RRR, S1, S2   Respiratory: Mild bilateral wheeze with rhonchi  Abdomen: soft, non tender, no distention , bowel sounds normal  Musculoskeletal: No edema b/l  Discharge Instructions   Discharge Instructions    Diet - low sodium heart healthy    Complete by:  As directed      Increase activity slowly    Complete by:  As directed           Current Discharge Medication List    START taking these medications   Details  amoxicillin-clavulanate (AUGMENTIN) 875-125 MG tablet Take 1 tablet by mouth 2 (two) times daily. Qty: 10 tablet, Refills: 0    guaiFENesin (MUCINEX) 600 MG 12 hr tablet Take 1 tablet (600 mg  total) by mouth 2 (two) times daily. Qty: 30 tablet, Refills: 0    oseltamivir (TAMIFLU) 30 MG capsule Take 1 capsule (30 mg total) by mouth 2 (two) times daily. Qty: 7 capsule, Refills: 0      CONTINUE these medications which have NOT CHANGED   Details  acetaminophen (TYLENOL) 500 MG tablet Take 500 mg by mouth every 6 (six) hours as needed.    albuterol (PROVENTIL HFA;VENTOLIN HFA) 108 (90 Base) MCG/ACT inhaler Inhale 1-2 puffs into the lungs every 6 (six) hours as needed for wheezing or shortness of breath.    aspirin 81 MG chewable tablet Chew 81 mg by mouth daily.    Dermatological Products, Misc. (DERMAREST ROSACEA) CREA Apply 1 application topically daily.    digoxin (LANOXIN) 0.25 MG tablet Take 250 mcg by mouth daily.    levothyroxine (SYNTHROID, LEVOTHROID) 125 MCG tablet Take 125 mcg by mouth daily before breakfast.    losartan (COZAAR) 100 MG tablet Take 100 mg by mouth daily.    pindolol (VISKEN) 5 MG tablet Take 2.5 mg by mouth daily. One half tablet daily    sodium chloride (OCEAN) 0.65 % SOLN nasal spray Place 1 spray into both nostrils as needed for congestion.       Allergies  Allergen Reactions  . Beta Adrenergic Blockers     REACTION: bradycardia and hypotension. Pt reports she is sensitive to the high doses, but she can take the lower doses.  . Metoprolol Succinate     REACTION: severe hypotension  . Pseudoephedrine     REACTION: causes tachycardia      The results of significant diagnostics from this hospitalization (including imaging, microbiology, ancillary and laboratory) are listed below for reference.    Significant Diagnostic Studies: Dg Chest 2 View  12/02/2015  CLINICAL DATA:  Cough and fever. EXAM: CHEST - 2 VIEW COMPARISON:  Two-view chest x-ray 08/29/2015. FINDINGS: The heart size is normal. A superior segment right lower lobe pneumonia is now present. The left lung is clear. Atherosclerotic calcifications are present at the aorta. The  visualized soft tissues and bony thorax are unremarkable. IMPRESSION: 1. Right lower lobe pneumonia. Electronically Signed   By: San Morelle M.D.   On: 12/02/2015 14:51    Microbiology: Recent Results (from the past 240 hour(s))  Urine culture     Status: None (Preliminary result)   Collection Time: 12/02/15  3:01 PM  Result Value Ref Range Status   Specimen Description URINE, CLEAN CATCH  Final   Special Requests NONE  Final   Culture   Final    NO GROWTH < 24 HOURS Performed at [facility]    Report Status PENDING  Incomplete  Culture, blood (routine x 2) Call MD if unable to obtain prior to antibiotics being given     Status: None (Preliminary result)   Collection Time: 12/02/15  6:42 PM  Result Value Ref Range Status   Specimen Description BLOOD LEFT HAND  Final   Special Requests BOTTLES DRAWN AEROBIC AND ANAEROBIC [place]  Final   Culture NO GROWTH 2 DAYS  Final   Report Status PENDING  Incomplete  Culture, blood (routine x 2) Call MD if unable to obtain prior to antibiotics being given     Status: None (Preliminary result)   Collection Time: 12/02/15  6:54 PM  Result Value Ref Range Status   Specimen Description LEFT ANTECUBITAL  Final   Special Requests BOTTLES DRAWN AEROBIC AND ANAEROBIC 4CC EACH  Final   Culture NO GROWTH 2 DAYS  Final   Report Status PENDING  Incomplete     Labs: Basic Metabolic Panel:  Recent Labs Lab 12/02/15 1520 12/03/15 0634  NA 134* 137  K 3.1* 3.4*  CL 98* 106  CO2 26 24  GLUCOSE 110* 97  BUN 29* 24*  CREATININE 1.20* 0.94  CALCIUM 8.5* 7.9*   Liver Function Tests:  Recent Labs Lab 12/02/15 1520  AST 39  ALT 35  ALKPHOS 59  BILITOT 2.2*  PROT 7.8  ALBUMIN 3.5    Recent Labs Lab 12/02/15 1520  LIPASE 25    CBC:  Recent Labs Lab 12/02/15 1520 12/03/15 0634  WBC 11.6* 7.0  NEUTROABS 9.2*  --   HGB 13.4 11.6*  HCT 40.5 35.7*  MCV 85.4 85.6  PLT 226 188   Cardiac Enzymes:  Recent Labs Lab  12/02/15 1520 12/02/15 1842 12/03/15 0206  TROPONINI 0.04* 0.04* 0.04*    Signed: Sarie Stall. MD  [facility] 12/04/2015, 11:13 AM    By signing my name below, I, Rosalie Doctor, attest that this documentation has been prepared under the direction and in the presence of [facility]. MD Electronically Signed: Rosalie Doctor, Scribe. 12/04/2015  I, Dr. Kathie Dike, personally performed the services described in this documentaiton. All medical record entries made by the scribe were at my direction and in my presence. I have reviewed the chart and agree that the record reflects my personal performance and is accurate and complete  Kathie Dike, MD, 12/04/2015 11:13 AM

## 2015-12-05 LAB — LEGIONELLA ANTIGEN, URINE

## 2015-12-07 LAB — CULTURE, BLOOD (ROUTINE X 2)
CULTURE: NO GROWTH
Culture: NO GROWTH

## 2016-01-03 DIAGNOSIS — J101 Influenza due to other identified influenza virus with other respiratory manifestations: Secondary | ICD-10-CM | POA: Diagnosis not present

## 2016-01-03 DIAGNOSIS — J189 Pneumonia, unspecified organism: Secondary | ICD-10-CM | POA: Diagnosis not present

## 2016-05-24 DIAGNOSIS — I10 Essential (primary) hypertension: Secondary | ICD-10-CM | POA: Diagnosis not present

## 2016-05-24 DIAGNOSIS — Z1212 Encounter for screening for malignant neoplasm of rectum: Secondary | ICD-10-CM | POA: Diagnosis not present

## 2016-05-24 DIAGNOSIS — J4521 Mild intermittent asthma with (acute) exacerbation: Secondary | ICD-10-CM | POA: Diagnosis not present

## 2016-05-24 DIAGNOSIS — E039 Hypothyroidism, unspecified: Secondary | ICD-10-CM | POA: Diagnosis not present

## 2016-05-24 DIAGNOSIS — I480 Paroxysmal atrial fibrillation: Secondary | ICD-10-CM | POA: Diagnosis not present

## 2016-05-24 DIAGNOSIS — Z0001 Encounter for general adult medical examination with abnormal findings: Secondary | ICD-10-CM | POA: Diagnosis not present

## 2016-05-24 DIAGNOSIS — Z6826 Body mass index (BMI) 26.0-26.9, adult: Secondary | ICD-10-CM | POA: Diagnosis not present

## 2016-05-24 DIAGNOSIS — K219 Gastro-esophageal reflux disease without esophagitis: Secondary | ICD-10-CM | POA: Diagnosis not present

## 2016-09-27 DIAGNOSIS — Z779 Other contact with and (suspected) exposures hazardous to health: Secondary | ICD-10-CM | POA: Diagnosis not present

## 2016-09-27 DIAGNOSIS — Z124 Encounter for screening for malignant neoplasm of cervix: Secondary | ICD-10-CM | POA: Diagnosis not present

## 2016-09-27 DIAGNOSIS — Z6828 Body mass index (BMI) 28.0-28.9, adult: Secondary | ICD-10-CM | POA: Diagnosis not present

## 2016-11-28 DIAGNOSIS — Z6825 Body mass index (BMI) 25.0-25.9, adult: Secondary | ICD-10-CM | POA: Diagnosis not present

## 2016-11-28 DIAGNOSIS — K219 Gastro-esophageal reflux disease without esophagitis: Secondary | ICD-10-CM | POA: Diagnosis not present

## 2016-11-28 DIAGNOSIS — I480 Paroxysmal atrial fibrillation: Secondary | ICD-10-CM | POA: Diagnosis not present

## 2016-11-28 DIAGNOSIS — E039 Hypothyroidism, unspecified: Secondary | ICD-10-CM | POA: Diagnosis not present

## 2017-01-10 DIAGNOSIS — E039 Hypothyroidism, unspecified: Secondary | ICD-10-CM | POA: Diagnosis not present

## 2017-05-30 DIAGNOSIS — Z6825 Body mass index (BMI) 25.0-25.9, adult: Secondary | ICD-10-CM | POA: Diagnosis not present

## 2017-05-30 DIAGNOSIS — E039 Hypothyroidism, unspecified: Secondary | ICD-10-CM | POA: Diagnosis not present

## 2017-05-30 DIAGNOSIS — I480 Paroxysmal atrial fibrillation: Secondary | ICD-10-CM | POA: Diagnosis not present

## 2017-05-30 DIAGNOSIS — K219 Gastro-esophageal reflux disease without esophagitis: Secondary | ICD-10-CM | POA: Diagnosis not present

## 2017-12-16 DIAGNOSIS — Z6826 Body mass index (BMI) 26.0-26.9, adult: Secondary | ICD-10-CM | POA: Diagnosis not present

## 2017-12-16 DIAGNOSIS — Z1212 Encounter for screening for malignant neoplasm of rectum: Secondary | ICD-10-CM | POA: Diagnosis not present

## 2017-12-16 DIAGNOSIS — Z0001 Encounter for general adult medical examination with abnormal findings: Secondary | ICD-10-CM | POA: Diagnosis not present

## 2017-12-16 DIAGNOSIS — E039 Hypothyroidism, unspecified: Secondary | ICD-10-CM | POA: Diagnosis not present

## 2017-12-16 DIAGNOSIS — Z23 Encounter for immunization: Secondary | ICD-10-CM | POA: Diagnosis not present

## 2017-12-16 DIAGNOSIS — K219 Gastro-esophageal reflux disease without esophagitis: Secondary | ICD-10-CM | POA: Diagnosis not present

## 2017-12-16 DIAGNOSIS — I480 Paroxysmal atrial fibrillation: Secondary | ICD-10-CM | POA: Diagnosis not present

## 2017-12-28 ENCOUNTER — Emergency Department (HOSPITAL_COMMUNITY)
Admission: EM | Admit: 2017-12-28 | Discharge: 2017-12-28 | Disposition: A | Discharge status: Home / Self Care | Payer: PPO | Attending: Emergency Medicine | Admitting: Emergency Medicine

## 2017-12-28 ENCOUNTER — Encounter (HOSPITAL_COMMUNITY): Payer: Self-pay | Admitting: Emergency Medicine

## 2017-12-28 DIAGNOSIS — N3091 Cystitis, unspecified with hematuria: Secondary | ICD-10-CM

## 2017-12-28 DIAGNOSIS — Z79899 Other long term (current) drug therapy: Secondary | ICD-10-CM | POA: Insufficient documentation

## 2017-12-28 DIAGNOSIS — N3001 Acute cystitis with hematuria: Secondary | ICD-10-CM | POA: Diagnosis not present

## 2017-12-28 DIAGNOSIS — E039 Hypothyroidism, unspecified: Secondary | ICD-10-CM | POA: Insufficient documentation

## 2017-12-28 DIAGNOSIS — Z7982 Long term (current) use of aspirin: Secondary | ICD-10-CM | POA: Insufficient documentation

## 2017-12-28 DIAGNOSIS — R300 Dysuria: Secondary | ICD-10-CM | POA: Diagnosis present

## 2017-12-28 DIAGNOSIS — N3090 Cystitis, unspecified without hematuria: Secondary | ICD-10-CM | POA: Diagnosis not present

## 2017-12-28 DIAGNOSIS — I480 Paroxysmal atrial fibrillation: Secondary | ICD-10-CM | POA: Diagnosis not present

## 2017-12-28 DIAGNOSIS — I10 Essential (primary) hypertension: Secondary | ICD-10-CM | POA: Insufficient documentation

## 2017-12-28 HISTORY — DX: Disorder of thyroid, unspecified: E07.9

## 2017-12-28 LAB — URINALYSIS, ROUTINE W REFLEX MICROSCOPIC
BACTERIA UA: NONE SEEN
BILIRUBIN URINE: NEGATIVE
GLUCOSE, UA: NEGATIVE mg/dL
Ketones, ur: NEGATIVE mg/dL
NITRITE: NEGATIVE
Protein, ur: 30 mg/dL — AB
Specific Gravity, Urine: 1.01 (ref 1.005–1.030)
pH: 6 (ref 5.0–8.0)

## 2017-12-28 MED ORDER — CEPHALEXIN 500 MG PO CAPS: 500.0000 mg | ORAL_CAPSULE | Freq: Four times a day (QID) | ORAL | 0 refills | Status: DC

## 2017-12-28 NOTE — ED Triage Notes (Signed)
Pt reports she has been having blood in her urine noticed x 1 hour.  Reports dysuria starting yesterday afternoon.

## 2017-12-28 NOTE — ED Notes (Signed)
Pt refused to have bp checked at triage.  States the machine gets too tight and would not allow nurses to check.

## 2017-12-28 NOTE — ED Provider Notes (Signed)
[facility] EMERGENCY DEPARTMENT Provider Note   CSN: [id number] Arrival date & time: 12/28/17  1351     History   Chief Complaint Chief Complaint  Patient presents with  . Hematuria    HPI Debbie Rubio is a 77 y.o. female.  HPI Patient presents with dysuria for the last 3 days and hematuria that started yesterday.  States she feels as if she has to frequently urinate.  She is not on anticoagulation.  No fevers.  Some dull lower abdominal pain.  Thinks she has a urinary tract infection.  No trauma.  No other bleeding.  Pain with urination is dull.  No chest pain or trouble breathing. Past Medical History:  Diagnosis Date  . Biatrial enlargement   . H/O: hypothyroidism   . Hypertension   . Paroxysmal atrial fibrillation ([facility])   . Thyroid disease     Patient Active Problem List   Diagnosis Date Noted  . Influenza B 12/02/2015  . Febrile respiratory illness 12/02/2015  . Elevated troponin 12/02/2015  . AKI (acute kidney injury) ([place]) 12/02/2015  . Hypokalemia 12/02/2015  . CAP (community acquired pneumonia)   . TINNITUS, CHRONIC 08/20/2008  . Hypothyroidism 08/04/2008  . ATRIAL FIBRILLATION, PAROXYSMAL 08/04/2008    Past Surgical History:  Procedure Laterality Date  . DILATION AND CURETTAGE OF UTERUS       OB History   None      Home Medications    Prior to Admission medications   Medication Sig Start Date End Date Taking? Authorizing Provider  acetaminophen (TYLENOL) 500 MG tablet Take 500 mg by mouth every 6 (six) hours as needed.   Yes [provider]  albuterol (PROVENTIL HFA;VENTOLIN HFA) 108 (90 Base) MCG/ACT inhaler Inhale 1-2 puffs into the lungs every 6 (six) hours as needed for wheezing or shortness of breath.   Yes [provider]  aspirin 81 MG chewable tablet Chew 81 mg by mouth daily.   Yes [provider]  Dermatological Products, [place]. (DERMAREST ROSACEA) CREA Apply 1 application topically daily.   Yes [provider]  digoxin (LANOXIN) 0.25 MG tablet Take 250 mcg by mouth daily.   Yes [provider]  guaiFENesin (MUCINEX) 600 MG 12 hr tablet Take 1 tablet (600 mg total) by mouth 2 (two) times daily. 12/04/15  Yes Kathie Dike, MD  levothyroxine (SYNTHROID, LEVOTHROID) 125 MCG tablet Take 125 mcg by mouth daily before breakfast.   Yes [provider]  losartan (COZAAR) 100 MG tablet Take 100 mg by mouth daily.   Yes [provider]  pindolol (VISKEN) 5 MG tablet Take 2.5 mg by mouth daily. One half tablet daily   Yes [provider]  traMADol (ULTRAM) 50 MG tablet Take 25 mg by mouth every 6 (six) hours as needed.   Yes [provider]  cephALEXin (KEFLEX) 500 MG capsule Take 1 capsule (500 mg total) by mouth 4 (four) times daily. 12/28/17   Davonna Belling, MD    Family History Family History  Problem Relation Age of Onset  . Heart attack Father   . Heart disease Mother     Social History Social History   Tobacco Use  . Smoking status: Never Smoker  Substance Use Topics  . Alcohol use: No  . Drug use: No     Allergies   Beta adrenergic blockers; Levaquin [levofloxacin]; Metoprolol succinate; Prednisone; and Pseudoephedrine   Review of Systems Review of Systems  Constitutional: Negative for fatigue and fever.  Respiratory:  Negative for shortness of breath.   Cardiovascular: Negative for chest pain.  Gastrointestinal: Negative for abdominal pain.  Genitourinary: Positive for dysuria and hematuria. Negative for vaginal bleeding, vaginal discharge and vaginal pain.  Musculoskeletal: Negative for back pain.  Hematological: Does not bruise/bleed easily.  Psychiatric/Behavioral: Negative for confusion.     Physical Exam Updated Vital Signs Pulse (!) 57   Temp 98.2 F (36.8 C) (Oral)   Resp 18   Ht 5\' 5"  (1.651 m)   Wt 72.6 kg (160 lb)   SpO2 92%   BMI 26.63 kg/m   Physical Exam  Constitutional: She appears  well-developed.  HENT:  Head: Normocephalic.  Eyes: Pupils are equal, round, and reactive to light.  Cardiovascular: Normal rate.  Pulmonary/Chest: Effort normal.  Abdominal: Soft. There is no tenderness.  Musculoskeletal: She exhibits no tenderness.  Neurological: She is alert.  Skin: Skin is warm. Capillary refill takes less than 2 seconds.     ED Treatments / Results  Labs (all labs ordered are listed, but only abnormal results are displayed) Labs Reviewed  URINALYSIS, ROUTINE W REFLEX MICROSCOPIC - Abnormal; Notable for the following components:      Result Value   APPearance CLOUDY (*)    Hgb urine dipstick LARGE (*)    Protein, ur 30 (*)    Leukocytes, UA LARGE (*)    Squamous Epithelial / LPF 0-5 (*)    All other components within normal limits  URINE CULTURE    EKG None  Radiology No results found.  Procedures Procedures (including critical care time)  Medications Ordered in ED Medications - No data to display   Initial Impression / Assessment and Plan / ED Course  I have reviewed the triage vital signs and the nursing notes.  Pertinent labs & imaging results that were available during my care of the patient were reviewed by me and considered in my medical decision making (see chart for details).     Patient with hematuria and dysuria.  Patient refused blood pressure because it squeezed her arm.  Benign exam.  Urine showed potential infection but did not have any bacteria.  Culture sent.  Will empirically treat however due to her symptoms.  Can be followed by her PCP for further management.  Will discharge home.  Final Clinical Impressions(s) / ED Diagnoses   Final diagnoses:  Hemorrhagic cystitis    ED Discharge Orders        Ordered    cephALEXin (KEFLEX) 500 MG capsule  4 times daily     12/28/17 1547       Davonna Belling, MD 12/28/17 1556

## 2017-12-30 LAB — URINE CULTURE

## 2017-12-31 ENCOUNTER — Telehealth: Payer: Self-pay | Admitting: Emergency Medicine

## 2017-12-31 NOTE — Telephone Encounter (Signed)
Post ED Visit - Positive Culture Follow-up  Culture report reviewed by antimicrobial stewardship pharmacist:  []  Elenor Quinones, Pharm.D. [x]  Heide Guile, Pharm.D., BCPS AQ-ID []  Parks Neptune, Pharm.D., BCPS []  Alycia Rossetti, Pharm.D., BCPS []  [place], [place].D., BCPS, AAHIVP []  Legrand Como, Pharm.D., BCPS, AAHIVP []  Salome Arnt, PharmD, BCPS []  Jalene Mullet, PharmD []  Vincenza Hews, PharmD, BCPS  Positive urine culture Treated with cephalexin, organism sensitive to the same and no further patient follow-up is required at this time.  Hazle Nordmann 12/31/2017, 12:39 PM

## 2018-01-26 ENCOUNTER — Emergency Department (HOSPITAL_COMMUNITY): Payer: PPO

## 2018-01-26 ENCOUNTER — Emergency Department (HOSPITAL_COMMUNITY)
Admission: EM | Admit: 2018-01-26 | Discharge: 2018-01-26 | Disposition: A | Discharge status: Home / Self Care | Payer: PPO | Attending: Emergency Medicine | Admitting: Emergency Medicine

## 2018-01-26 ENCOUNTER — Encounter (HOSPITAL_COMMUNITY): Payer: Self-pay | Admitting: Emergency Medicine

## 2018-01-26 DIAGNOSIS — R110 Nausea: Secondary | ICD-10-CM | POA: Insufficient documentation

## 2018-01-26 DIAGNOSIS — E039 Hypothyroidism, unspecified: Secondary | ICD-10-CM | POA: Insufficient documentation

## 2018-01-26 DIAGNOSIS — Z7982 Long term (current) use of aspirin: Secondary | ICD-10-CM | POA: Insufficient documentation

## 2018-01-26 DIAGNOSIS — I10 Essential (primary) hypertension: Secondary | ICD-10-CM | POA: Diagnosis not present

## 2018-01-26 DIAGNOSIS — Z79899 Other long term (current) drug therapy: Secondary | ICD-10-CM | POA: Insufficient documentation

## 2018-01-26 DIAGNOSIS — R9389 Abnormal findings on diagnostic imaging of other specified body structures: Secondary | ICD-10-CM

## 2018-01-26 DIAGNOSIS — R109 Unspecified abdominal pain: Secondary | ICD-10-CM | POA: Insufficient documentation

## 2018-01-26 DIAGNOSIS — D3501 Benign neoplasm of right adrenal gland: Secondary | ICD-10-CM | POA: Diagnosis not present

## 2018-01-26 LAB — CBC WITH DIFFERENTIAL/PLATELET
Basophils Absolute: 0 10*3/uL (ref 0.0–0.1)
Basophils Relative: 0 %
Eosinophils Absolute: 0.1 10*3/uL (ref 0.0–0.7)
Eosinophils Relative: 2 %
HCT: 45 % (ref 36.0–46.0)
Hemoglobin: 14.6 g/dL (ref 12.0–15.0)
Lymphocytes Relative: 22 %
Lymphs Abs: 1.7 10*3/uL (ref 0.7–4.0)
MCH: 28.7 pg (ref 26.0–34.0)
MCHC: 32.4 g/dL (ref 30.0–36.0)
MCV: 88.6 fL (ref 78.0–100.0)
Monocytes Absolute: 0.6 10*3/uL (ref 0.1–1.0)
Monocytes Relative: 8 %
Neutro Abs: 5.2 10*3/uL (ref 1.7–7.7)
Neutrophils Relative %: 68 %
Platelets: 206 10*3/uL (ref 150–400)
RBC: 5.08 MIL/uL (ref 3.87–5.11)
RDW: 13.3 % (ref 11.5–15.5)
WBC: 7.8 10*3/uL (ref 4.0–10.5)

## 2018-01-26 LAB — URINALYSIS, ROUTINE W REFLEX MICROSCOPIC
Bilirubin Urine: NEGATIVE
Glucose, UA: NEGATIVE mg/dL
Hgb urine dipstick: NEGATIVE
Ketones, ur: NEGATIVE mg/dL
Leukocytes, UA: NEGATIVE
Nitrite: NEGATIVE
Protein, ur: NEGATIVE mg/dL
Specific Gravity, Urine: 1.008 (ref 1.005–1.030)
pH: 6 (ref 5.0–8.0)

## 2018-01-26 LAB — COMPREHENSIVE METABOLIC PANEL
ALT: 25 U/L (ref 14–54)
AST: 20 U/L (ref 15–41)
Albumin: 4.1 g/dL (ref 3.5–5.0)
Alkaline Phosphatase: 72 U/L (ref 38–126)
Anion gap: 6 (ref 5–15)
BUN: 16 mg/dL (ref 6–20)
CO2: 30 mmol/L (ref 22–32)
Calcium: 9.4 mg/dL (ref 8.9–10.3)
Chloride: 103 mmol/L (ref 101–111)
Creatinine, Ser: 0.86 mg/dL (ref 0.44–1.00)
GFR calc Af Amer: >60 mL/min (ref >60)
GFR calc non Af Amer: >60 mL/min (ref >60)
Glucose, Bld: 108 mg/dL — ABNORMAL HIGH (ref 65–99)
Potassium: 4 mmol/L (ref 3.5–5.1)
Sodium: 139 mmol/L (ref 135–145)
Total Bilirubin: 1.3 mg/dL — ABNORMAL HIGH (ref 0.3–1.2)
Total Protein: 8.4 g/dL — ABNORMAL HIGH (ref 6.5–8.1)

## 2018-01-26 LAB — LIPASE, BLOOD: Lipase: 35 U/L (ref 11–51)

## 2018-01-26 MED ORDER — HYDROCODONE-ACETAMINOPHEN 5-325 MG PO TABS: 1.0000 | ORAL_TABLET | Freq: Four times a day (QID) | ORAL | 0 refills | Status: DC | PRN

## 2018-01-26 NOTE — ED Triage Notes (Signed)
Pt states she was seen a few weeks ago for uti.  Those s/s resolved, but about a week ago developed left sided back pain.  Denies fever, vomiting, or chills.

## 2018-01-26 NOTE — Discharge Instructions (Addendum)
You can take one Norco every 6 hours as needed for severe pain.  Do not combine this with Tylenol.  Use ice and heat alternating 20 minutes on, 20 minutes off.  Attempt the exercises and stretches as tolerated. Please follow up with your doctor for recheck if your symptoms are not improving.  Please follow-up with your OB/GYN for a pelvic ultrasound for the endometrial masslike thickening found on your CT today.  It is important that you do this as soon as possible to rule out any cancer or any other time sensitive issue.  Please return to the emergency department if you develop any new or worsening symptoms.  Do not drink alcohol, drive, operate machinery or participate in any other potentially dangerous activities while taking opiate pain medication as it may make you sleepy. Do not take this medication with any other sedating medications, either prescription or over-the-counter. If you were prescribed Percocet or Vicodin, do not take these with acetaminophen (Tylenol) as it is already contained within these medications and overdose of Tylenol is dangerous.   This medication is an opiate (or narcotic) pain medication and can be habit forming.  Use it as little as possible to achieve adequate pain control.  Do not use or use it with extreme caution if you have a history of opiate abuse or dependence. This medication is intended for your use only - do not give any to anyone else and keep it in a secure place where nobody else, especially children, have access to it. It will also cause or worsen constipation, so you may want to consider taking an over-the-counter stool softener while you are taking this medication.

## 2018-01-26 NOTE — ED Provider Notes (Signed)
[facility] EMERGENCY DEPARTMENT Provider Note   CSN: [id number] Arrival date & time: 01/26/18  4196     History   Chief Complaint Chief Complaint  Patient presents with  . Back Pain    HPI Debbie Rubio is a 77 y.o. female with history of hypothyroidism, paroxysmal atrial fibrillation, hypertension who presents with a one-week history of left-sided back/flank pain.  She describes her pain as a sudden, sharp catch.  It is improved with movement and putting pressure on the area.  She has had some associated nausea, but no vomiting.  She denies any fevers, numbness or tingling, saddle anesthesia, loss of bowel or bladder control, history of procedures to her back, history of cancer.  She has taken Tylenol at home without significant relief.  She denies any chest pain, shortness of breath, abdominal pain, urinary symptoms.  She denies injury.  She reports she does watch her grandchild and has to lift him occasionally, however she states it does not feel like a muscle strain.  HPI  Past Medical History:  Diagnosis Date  . Biatrial enlargement   . H/O: hypothyroidism   . Hypertension   . Paroxysmal atrial fibrillation (HCC)   . Thyroid disease     Patient Active Problem List   Diagnosis Date Noted  . Influenza B 12/02/2015  . Febrile respiratory illness 12/02/2015  . Elevated troponin 12/02/2015  . AKI (acute kidney injury) ([place]) 12/02/2015  . Hypokalemia 12/02/2015  . CAP (community acquired pneumonia)   . TINNITUS, CHRONIC 08/20/2008  . Hypothyroidism 08/04/2008  . ATRIAL FIBRILLATION, PAROXYSMAL 08/04/2008    Past Surgical History:  Procedure Laterality Date  . DILATION AND CURETTAGE OF UTERUS       OB History   None      Home Medications    Prior to Admission medications   Medication Sig Start Date End Date Taking? Authorizing Provider  acetaminophen (TYLENOL) 500 MG tablet Take 500 mg by mouth every 6 (six) hours as needed.   Yes [provider]    albuterol (PROVENTIL HFA;VENTOLIN HFA) 108 (90 Base) MCG/ACT inhaler Inhale 1-2 puffs into the lungs every 6 (six) hours as needed for wheezing or shortness of breath.   Yes [provider]  aspirin 81 MG chewable tablet Chew 81 mg by mouth daily.   Yes [provider]  Dermatological Products, [place]. (DERMAREST ROSACEA) CREA Apply 1 application topically daily.   Yes [provider]  digoxin (LANOXIN) 0.25 MG tablet Take 250 mcg by mouth daily.   Yes [provider]  guaiFENesin (MUCINEX) 600 MG 12 hr tablet Take 1 tablet (600 mg total) by mouth 2 (two) times daily. 12/04/15  Yes Kathie Dike, MD  levothyroxine (SYNTHROID, LEVOTHROID) 125 MCG tablet Take 125 mcg by mouth daily before breakfast.   Yes [provider]  losartan (COZAAR) 100 MG tablet Take 100 mg by mouth daily.   Yes [provider]  pindolol (VISKEN) 5 MG tablet Take 2.5 mg by mouth daily. One half tablet daily   Yes [provider]  traMADol (ULTRAM) 50 MG tablet Take 25 mg by mouth every 6 (six) hours as needed.   Yes [provider]  HYDROcodone-acetaminophen (NORCO/VICODIN) 5-325 MG tablet Take 1 tablet by mouth every 6 (six) hours as needed. 01/26/18   Frederica Kuster, PA-C    Family History Family History  Problem Relation Age of Onset  . Heart attack Father   . Heart disease Mother  Social History Social History   Tobacco Use  . Smoking status: Never Smoker  Substance Use Topics  . Alcohol use: No  . Drug use: No     Allergies   Beta adrenergic blockers; Flecainide; Levaquin [levofloxacin]; Metoprolol succinate; Prednisone; and Pseudoephedrine   Review of Systems Review of Systems  Constitutional: Negative for chills and fever.  HENT: Negative for facial swelling and sore throat.   Respiratory: Negative for shortness of breath.   Cardiovascular: Negative for chest pain.  Gastrointestinal: Positive for nausea. Negative for  abdominal pain and vomiting.  Genitourinary: Positive for flank pain. Negative for dysuria.  Musculoskeletal: Positive for back pain.  Skin: Negative for rash and wound.  Neurological: Negative for headaches.  Psychiatric/Behavioral: The patient is not nervous/anxious.      Physical Exam Updated Vital Signs BP (!) 180/94 (BP Location: Left Arm)   Pulse (!) 59   Temp 98.2 F (36.8 C) (Oral)   Resp 18   Ht 5\' 5"  (1.651 m)   Wt 72.6 kg (160 lb)   SpO2 100%   BMI 26.63 kg/m   Physical Exam  Constitutional: She appears well-developed and well-nourished. No distress.  HENT:  Head: Normocephalic and atraumatic.  Mouth/Throat: Oropharynx is clear and moist. No oropharyngeal exudate.  Eyes: Pupils are equal, round, and reactive to light. Conjunctivae are normal. Right eye exhibits no discharge. Left eye exhibits no discharge. No scleral icterus.  Neck: Normal range of motion. Neck supple. No thyromegaly present.  Cardiovascular: Normal rate, regular rhythm, normal heart sounds and intact distal pulses. Exam reveals no gallop and no friction rub.  No murmur heard. Pulmonary/Chest: Effort normal and breath sounds normal. No stridor. No respiratory distress. She has no wheezes. She has no rales.  Abdominal: Soft. Bowel sounds are normal. She exhibits no distension. There is no tenderness. There is CVA tenderness. There is no rebound and no guarding.  Musculoskeletal: She exhibits no edema.       Back:  Lymphadenopathy:    She has no cervical adenopathy.  Neurological: She is alert. Coordination normal.  5/5 strength to bilateral lower extremities, normal sensation  Skin: Skin is warm and dry. No rash noted. She is not diaphoretic. No pallor.  No rashes noted in the area of pain, no pain to light touch  Psychiatric: She has a normal mood and affect.  Nursing note and vitals reviewed.    ED Treatments / Results  Labs (all labs ordered are listed, but only abnormal results are  displayed) Labs Reviewed  COMPREHENSIVE METABOLIC PANEL - Abnormal; Notable for the following components:      Result Value   Glucose, Bld 108 (*)    Total Protein 8.4 (*)    Total Bilirubin 1.3 (*)    All other components within normal limits  URINALYSIS, ROUTINE W REFLEX MICROSCOPIC  CBC WITH DIFFERENTIAL/PLATELET  LIPASE, BLOOD    EKG None  Radiology Ct Renal Stone Study  Result Date: 01/26/2018 CLINICAL DATA:  77 year old female with history of urinary tract infection presenting with left-sided back pain. EXAM: CT ABDOMEN AND PELVIS WITHOUT CONTRAST TECHNIQUE: Multidetector CT imaging of the abdomen and pelvis was performed following the standard protocol without IV contrast. COMPARISON:  No priors. FINDINGS: Lower chest: Cardiomegaly with severe right atrial dilatation. Small hiatal hernia. Hepatobiliary: No definite cystic or solid hepatic lesions are confidently identified on today's noncontrast CT examination. Unenhanced appearance of the gallbladder is normal. Pancreas: No definite pancreatic mass or peripancreatic fluid or inflammatory changes are  noted on today's noncontrast CT examination. Spleen: Unremarkable. Adrenals/Urinary Tract: 17 x 14 mm low-attenuation (-10 HU) right adrenal nodule is compatible with a benign adenoma. Unenhanced appearance of the kidneys, left adrenal gland and urinary bladder is normal. Stomach/Bowel: Unenhanced appearance of the stomach is normal. No pathologic dilatation of small bowel or colon. Normal appendix. Vascular/Lymphatic: Aortic atherosclerosis, without evidence of aneurysm in the abdominal or pelvic vasculature. No lymphadenopathy noted in the abdomen or pelvis. Reproductive: Mass-like thickening of the endometrium in the fundus of the uterus measuring up to 2.7 cm in thickness (sagittal image 54 of series 6), concerning for potential neoplasm. Ovaries are atrophic. Other: No significant volume of ascites.  No pneumoperitoneum. Musculoskeletal:  There are no aggressive appearing lytic or blastic lesions noted in the visualized portions of the skeleton. IMPRESSION: 1. No acute findings are noted in the abdomen or pelvis to account for the patient's symptoms. 2. Importantly, there is mass-like thickening of the endometrium, concerning for potential endometrial neoplasia. Further evaluation with nonemergent pelvic ultrasound is strongly recommended in the near future to better evaluate this finding. 3. Small right adrenal adenoma incidentally noted. 4. Aortic atherosclerosis. 5. Cardiomegaly with right atrial dilatation. Aortic Atherosclerosis (ICD10-I70.0). Electronically Signed   By: Vinnie Langton M.D.   On: 01/26/2018 10:51    Procedures Procedures (including critical care time)  Medications Ordered in ED Medications - No data to display   Initial Impression / Assessment and Plan / ED Course  I have reviewed the triage vital signs and the nursing notes.  Pertinent labs & imaging results that were available during my care of the patient were reviewed by me and considered in my medical decision making (see chart for details).     Patient with suspected musculoskeletal back pain.  CBC unremarkable.  CMP and lipase within normal limits.  UA is completely negative.  No hematuria today.  CT renal stone study shows no acute findings in the abdomen and pelvis to account for the patient's symptoms, however there is a masslike thickening of the endometrium concerning for potential endometrial neoplasia and a nonemergent pelvic ultrasound is recommended.  I made patient aware of this finding and that she should follow-up with OB/GYN as soon as possible for further evaluation of this.  Will treat with short course of Norco for pain control and supportive treatment including ice, heat, stretching discussed.  I reviewed the [place] narcotic database and found no discrepancies.  Follow-up to PCP for recheck of back pain in 2 to 3 days.  Return precautions  discussed.  Patient understands and agrees with plan.  Patient vitals stable throughout ED course and discharged in satisfactory condition.  Patient also evaluated by Dr. Lacinda Axon who agrees with plan.  Final Clinical Impressions(s) / ED Diagnoses   Final diagnoses:  Left flank pain  Endometrial thickening on ultrasound    ED Discharge Orders        Ordered    HYDROcodone-acetaminophen (NORCO/VICODIN) 5-325 MG tablet  Every 6 hours PRN     01/26/18 [address], PA-C 01/26/18 1246    Nat Christen, MD 01/27/18 [phone]

## 2018-01-27 DIAGNOSIS — E039 Hypothyroidism, unspecified: Secondary | ICD-10-CM | POA: Diagnosis not present

## 2018-01-27 DIAGNOSIS — I10 Essential (primary) hypertension: Secondary | ICD-10-CM | POA: Diagnosis not present

## 2018-01-27 DIAGNOSIS — K210 Gastro-esophageal reflux disease with esophagitis: Secondary | ICD-10-CM | POA: Diagnosis not present

## 2018-01-27 DIAGNOSIS — Z9189 Other specified personal risk factors, not elsewhere classified: Secondary | ICD-10-CM | POA: Diagnosis not present

## 2018-01-28 DIAGNOSIS — R1900 Intra-abdominal and pelvic swelling, mass and lump, unspecified site: Secondary | ICD-10-CM | POA: Diagnosis not present

## 2018-02-04 DIAGNOSIS — N939 Abnormal uterine and vaginal bleeding, unspecified: Secondary | ICD-10-CM

## 2018-02-12 DIAGNOSIS — N939 Abnormal uterine and vaginal bleeding, unspecified: Secondary | ICD-10-CM | POA: Diagnosis not present

## 2018-02-12 NOTE — H&P (Deleted)
  The note originally documented on this encounter has been moved the the encounter in which it belongs.  

## 2018-02-12 NOTE — H&P (Signed)
NAME: Debbie Rubio, Debbie Rubio MEDICAL RECORD NL:[id number] ACCOUNT [id number] DATE OF BIRTH:[birth year] FACILITY: [facility] LOCATION: [facility] PHYSICIAN:Ericia Moxley Garry Heater, MD  HISTORY AND PHYSICAL  DATE OF ADMISSION:  02/04/2018  CHIEF COMPLAINT:  Pelvic pain, endometrial buildup noted on CT.  HISTORY OF PRESENT ILLNESS:  A 77 year old postmenopausal patient, G4 P3, who had a LEEP in 2013 that was negative.  Her Paps since that time have been normal.  Recently, she developed significant flank pain and was seen in the [facility] ED where  ultimately a CT was done.  She has not had any postmenopausal bleeding and did not appear to have a stone; however, the CT was noted to have a mass-like thickening of the endometrium measuring 2.7 cm in thickness, concerning for endometrial neoplasia.  Attempts at the office to dilate were unsuccessful due to stenosis.  She presents at this time for exam under anesthesia and D and C hysteroscopy.  We will have ultrasound standby for guidance also.  This procedure, including the specific risks regarding  bleeding, infection, other complications such as perforation that may require open or additional surgery, were discussed with her, which she understands and accepts.  PAST MEDICAL HISTORY:  Medical illnesses:  She has a history of hypothyroidism and hypertension, which are treated.  PAST SURGICAL HISTORY:  She has had 1 prior D and C and vaginal deliveries.  CURRENT MEDICATIONS:  She takes albuterol p.r.n., baby aspirin once daily.  She is on Lanoxin 0.25, levothyroxine, losartan, pindolol which is a beta blocker, tramadol p.r.n.  FAMILY HISTORY:  Significant for father with heart attack, mother with heart disease.  SOCIAL HISTORY:  Denies smoking, alcohol or drug use.  ALLERGIES:  LEVAQUIN, METOPROLOL AND SUDAFED.  PHYSICAL EXAMINATION: VITAL SIGNS:  Temperature 98.2, blood pressure 170/90. HEENT:  Unremarkable. NECK:  Supple, without masses. LUNGS:   Clear. CARDIOVASCULAR:  Regular rate and rhythm without murmurs, rubs or gallops. BREASTS:  Without masses. ABDOMEN:  Soft, flat, nontender. PELVIC:  Vulva, vagina and cervix looked normal.  There is a pinpoint opening at the cervix consistent with stenosis.  Uterus midposition, normal size.  Adnexa negative.  IMPRESSION:  Pelvic pain, endometrial buildup noted on recent CT scan as noted.  PLAN:  D and C hysteroscopy with ultrasound guidance.  Procedure and risks discussed as above.  LN/NUANCE  D:02/04/2018 T:02/04/2018 JOB:[id number]

## 2018-02-13 NOTE — Patient Instructions (Addendum)
Your procedure is scheduled on: Monday February 24, 2018 at 1:00  Enter through the Main Entrance of [facility] at: 11:30 am  Pick up the phone at the desk and dial [phone].  Call this number if you have problems the morning of surgery: [phone].  Remember: Do NOT eat food: after Midnight on Sunday June 2 Do NOT drink clear liquids after: 7:00 am Take these medicines the morning of surgery with a SIP OF WATER: Levothyroxine, Pindolol.   Tramadol if needed  BRING ALBUTEROL INHALER WITH YOU DAY OF SURGERY  STOP ALL VITAMINS, HERBAL MEDICATIONS, SUPPLEMENTS NOW  DO NOT SMOKE DAY OF SURGERY    Do NOT wear jewelry (body piercing), metal hair clips/bobby pins, make-up, or nail polish. Do NOT wear lotions, powders, or perfumes.  You may wear deoderant. Do NOT shave for 48 hours prior to surgery. Do NOT bring valuables to the hospital. Contacts, dentures, or bridgework may not be worn into surgery.   Have a responsible adult drive you home and stay with you for 24 hours after your procedure

## 2018-02-14 ENCOUNTER — Encounter (HOSPITAL_COMMUNITY)
Admission: RE | Admit: 2018-02-14 | Discharge: 2018-02-14 | Disposition: A | Discharge status: Home / Self Care | Payer: PPO | Source: Ambulatory Visit | Attending: Obstetrics and Gynecology | Admitting: Obstetrics and Gynecology

## 2018-02-14 ENCOUNTER — Encounter (HOSPITAL_COMMUNITY): Payer: Self-pay

## 2018-02-14 ENCOUNTER — Encounter (HOSPITAL_COMMUNITY): Payer: Self-pay | Admitting: Anesthesiology

## 2018-02-14 ENCOUNTER — Other Ambulatory Visit: Payer: Self-pay

## 2018-02-14 DIAGNOSIS — Z01812 Encounter for preprocedural laboratory examination: Secondary | ICD-10-CM | POA: Insufficient documentation

## 2018-02-14 HISTORY — DX: Bronchitis, not specified as acute or chronic: J40

## 2018-02-14 HISTORY — DX: Gastro-esophageal reflux disease without esophagitis: K21.9

## 2018-02-14 HISTORY — DX: Anemia, unspecified: D64.9

## 2018-02-14 HISTORY — DX: Pneumonia, unspecified organism: J18.9

## 2018-02-14 HISTORY — DX: Unspecified osteoarthritis, unspecified site: M19.90

## 2018-02-14 HISTORY — DX: Dyspnea, unspecified: R06.00

## 2018-02-14 HISTORY — DX: Urinary tract infection, site not specified: N39.0

## 2018-02-14 HISTORY — DX: Hypothyroidism, unspecified: E03.9

## 2018-02-14 HISTORY — DX: Muscle spasm of back: M62.830

## 2018-02-14 LAB — COMPREHENSIVE METABOLIC PANEL
ALBUMIN: 4 g/dL (ref 3.5–5.0)
ALT: 29 U/L (ref 14–54)
AST: 26 U/L (ref 15–41)
Alkaline Phosphatase: 65 U/L (ref 38–126)
Anion gap: 10 (ref 5–15)
BUN: 18 mg/dL (ref 6–20)
CHLORIDE: 103 mmol/L (ref 101–111)
CO2: 25 mmol/L (ref 22–32)
CREATININE: 0.83 mg/dL (ref 0.44–1.00)
Calcium: 9.2 mg/dL (ref 8.9–10.3)
GFR calc Af Amer: >60 mL/min (ref >60)
GLUCOSE: 89 mg/dL (ref 65–99)
Potassium: 4.1 mmol/L (ref 3.5–5.1)
Sodium: 138 mmol/L (ref 135–145)
Total Bilirubin: 1.5 mg/dL — ABNORMAL HIGH (ref 0.3–1.2)
Total Protein: 8.3 g/dL — ABNORMAL HIGH (ref 6.5–8.1)

## 2018-02-14 LAB — CBC
HEMATOCRIT: 44.4 % (ref 36.0–46.0)
Hemoglobin: 14.5 g/dL (ref 12.0–15.0)
MCH: 29.3 pg (ref 26.0–34.0)
MCHC: 32.7 g/dL (ref 30.0–36.0)
MCV: 89.7 fL (ref 78.0–100.0)
PLATELETS: 222 10*3/uL (ref 150–400)
RBC: 4.95 MIL/uL (ref 3.87–5.11)
RDW: 13.5 % (ref 11.5–15.5)
WBC: 7.6 10*3/uL (ref 4.0–10.5)

## 2018-02-14 LAB — TYPE AND SCREEN
ABO/RH(D): AB POS
ANTIBODY SCREEN: NEGATIVE

## 2018-02-14 LAB — ABO/RH: ABO/RH(D): AB POS

## 2018-02-14 NOTE — Pre-Procedure Instructions (Signed)
Dr. Royce Macadamia viewed EKG, aware of history of atrial fib. Okay to precede with surgery. No other clearance needed

## 2018-02-24 ENCOUNTER — Encounter (HOSPITAL_COMMUNITY)
Admission: RE | Disposition: A | Discharge status: Home / Self Care | Payer: Self-pay | Source: Ambulatory Visit | Attending: Obstetrics and Gynecology

## 2018-02-24 ENCOUNTER — Encounter (HOSPITAL_COMMUNITY): Payer: Self-pay | Admitting: *Deleted

## 2018-02-24 ENCOUNTER — Ambulatory Visit (HOSPITAL_COMMUNITY)
Admission: RE | Admit: 2018-02-24 | Discharge: 2018-02-24 | Disposition: A | Discharge status: Home / Self Care | Payer: PPO | Source: Ambulatory Visit | Attending: Obstetrics and Gynecology | Admitting: Obstetrics and Gynecology

## 2018-02-24 DIAGNOSIS — N950 Postmenopausal bleeding: Secondary | ICD-10-CM

## 2018-02-24 DIAGNOSIS — Z8249 Family history of ischemic heart disease and other diseases of the circulatory system: Secondary | ICD-10-CM | POA: Insufficient documentation

## 2018-02-24 DIAGNOSIS — N939 Abnormal uterine and vaginal bleeding, unspecified: Secondary | ICD-10-CM

## 2018-02-24 DIAGNOSIS — Z888 Allergy status to other drugs, medicaments and biological substances status: Secondary | ICD-10-CM | POA: Insufficient documentation

## 2018-02-24 DIAGNOSIS — Z5309 Procedure and treatment not carried out because of other contraindication: Secondary | ICD-10-CM | POA: Insufficient documentation

## 2018-02-24 DIAGNOSIS — Z79899 Other long term (current) drug therapy: Secondary | ICD-10-CM | POA: Insufficient documentation

## 2018-02-24 DIAGNOSIS — R9389 Abnormal findings on diagnostic imaging of other specified body structures: Secondary | ICD-10-CM | POA: Insufficient documentation

## 2018-02-24 DIAGNOSIS — I4891 Unspecified atrial fibrillation: Secondary | ICD-10-CM | POA: Insufficient documentation

## 2018-02-24 DIAGNOSIS — Z79890 Hormone replacement therapy (postmenopausal): Secondary | ICD-10-CM | POA: Insufficient documentation

## 2018-02-24 DIAGNOSIS — Z7982 Long term (current) use of aspirin: Secondary | ICD-10-CM | POA: Insufficient documentation

## 2018-02-24 DIAGNOSIS — Z881 Allergy status to other antibiotic agents status: Secondary | ICD-10-CM | POA: Insufficient documentation

## 2018-02-24 DIAGNOSIS — I10 Essential (primary) hypertension: Secondary | ICD-10-CM | POA: Insufficient documentation

## 2018-02-24 DIAGNOSIS — E039 Hypothyroidism, unspecified: Secondary | ICD-10-CM | POA: Insufficient documentation

## 2018-02-24 DIAGNOSIS — N882 Stricture and stenosis of cervix uteri: Secondary | ICD-10-CM | POA: Insufficient documentation

## 2018-02-24 DIAGNOSIS — Z87891 Personal history of nicotine dependence: Secondary | ICD-10-CM | POA: Insufficient documentation

## 2018-02-24 DIAGNOSIS — R102 Pelvic and perineal pain: Secondary | ICD-10-CM | POA: Insufficient documentation

## 2018-02-24 HISTORY — DX: Presence of dental prosthetic device (complete) (partial): Z97.2

## 2018-02-24 SURGERY — DILATATION AND CURETTAGE /HYSTEROSCOPY
Anesthesia: Choice

## 2018-02-24 MED ORDER — CEFOTETAN DISODIUM-DEXTROSE 2-2.08 GM-%(50ML) IV SOLR: 2.0000 g | INTRAVENOUS | Status: DC

## 2018-02-24 MED ORDER — LACTATED RINGERS IV SOLN: INTRAVENOUS | Status: DC

## 2018-02-24 MED ORDER — SUGAMMADEX SODIUM 200 MG/2ML IV SOLN
INTRAVENOUS | Status: AC
  Filled 2018-02-24: qty 2

## 2018-02-24 MED ORDER — FENTANYL CITRATE (PF) 100 MCG/2ML IJ SOLN
INTRAMUSCULAR | Status: AC
  Filled 2018-02-24: qty 2

## 2018-02-24 MED ORDER — ONDANSETRON HCL 4 MG/2ML IJ SOLN
INTRAMUSCULAR | Status: AC
  Filled 2018-02-24: qty 2

## 2018-02-24 MED ORDER — SODIUM CHLORIDE 0.9 % IV SOLN
INTRAVENOUS | Status: AC
  Filled 2018-02-24: qty 2

## 2018-02-24 MED ORDER — LIDOCAINE HCL 1 % IJ SOLN
INTRAMUSCULAR | Status: AC
  Filled 2018-02-24: qty 20

## 2018-02-24 MED ORDER — PROPOFOL 10 MG/ML IV BOLUS
INTRAVENOUS | Status: AC
  Filled 2018-02-24: qty 20

## 2018-02-24 MED ORDER — LIDOCAINE HCL (CARDIAC) PF 100 MG/5ML IV SOSY
PREFILLED_SYRINGE | INTRAVENOUS | Status: AC
  Filled 2018-02-24: qty 5

## 2018-02-24 NOTE — Anesthesia Preprocedure Evaluation (Deleted)
Anesthesia Evaluation  Patient identified by MRN, date of birth, ID band Patient awake    Reviewed: Allergy & Precautions, NPO status , Patient's Chart, lab work & pertinent test results  History of Anesthesia Complications Negative for: history of anesthetic complications  Airway        Dental   Pulmonary former smoker,           Cardiovascular hypertension, Pt. on medications and Pt. on home beta blockers + dysrhythmias Atrial Fibrillation   Impressions:  - Normal LV wall thickness with LVEF 60-65%. Indeterminate   diastolic function. Mild biatrial enlargement. Mildly thickened   mitral leaflets with mild to moderate mitral regurgitation.   Moderate tricuspid regurgitation with PASP estimated 55 mmHg   Neuro/Psych negative neurological ROS  negative psych ROS   GI/Hepatic negative GI ROS, Neg liver ROS,   Endo/Other  negative endocrine ROS  Renal/GU negative Renal ROS  negative genitourinary   Musculoskeletal negative musculoskeletal ROS (+)   Abdominal   Peds negative pediatric ROS (+)  Hematology negative hematology ROS (+)   Anesthesia Other Findings   Reproductive/Obstetrics negative OB ROS                             Anesthesia Physical Anesthesia Plan  ASA: III  Anesthesia Plan: General   Post-op Pain Management:    Induction: Intravenous  PONV Risk Score and Plan: 4 or greater and Ondansetron, Dexamethasone, Treatment may vary due to age or medical condition and Diphenhydramine  Airway Management Planned: LMA  Additional Equipment:   Intra-op Plan:   Post-operative Plan: Extubation in OR  Informed Consent:   Plan Discussed with: Anesthesiologist and Surgeon  Anesthesia Plan Comments: (Pt febrile on arrival with recent diarrhea.  Dr. Matthew Saras informed, this procedure is not emergent, so we will postpone.)       Anesthesia Quick Evaluation

## 2018-03-02 NOTE — Anesthesia Preprocedure Evaluation (Addendum)
Anesthesia Evaluation  Patient identified by MRN, date of birth, ID band Patient awake    Reviewed: Allergy & Precautions, NPO status , Patient's Chart, lab work & pertinent test results  History of Anesthesia Complications Negative for: history of anesthetic complications  Airway Mallampati: I       Dental  (+) Upper Dentures, Dental Advisory Given   Pulmonary former smoker,    Pulmonary exam normal        Cardiovascular hypertension, Pt. on medications and Pt. on home beta blockers Normal cardiovascular exam+ dysrhythmias Atrial Fibrillation   Impressions:  - Normal LV wall thickness with LVEF 60-65%. Indeterminate   diastolic function. Mild biatrial enlargement. Mildly thickened   mitral leaflets with mild to moderate mitral regurgitation.   Moderate tricuspid regurgitation with PASP estimated 55 mmHg   Neuro/Psych negative neurological ROS  negative psych ROS   GI/Hepatic negative GI ROS, Neg liver ROS,   Endo/Other  negative endocrine ROS  Renal/GU negative Renal ROS  negative genitourinary   Musculoskeletal negative musculoskeletal ROS (+)   Abdominal   Peds negative pediatric ROS (+)  Hematology negative hematology ROS (+)   Anesthesia Other Findings   Reproductive/Obstetrics negative OB ROS                            Lab Results  Component Value Date   WBC 7.6 02/14/2018   HGB 14.5 02/14/2018   HCT 44.4 02/14/2018   MCV 89.7 02/14/2018   PLT 222 02/14/2018    Anesthesia Physical Anesthesia Plan  ASA: III  Anesthesia Plan: General   Post-op Pain Management:    Induction:   PONV Risk Score and Plan: 3 and Treatment may vary due to age or medical condition, Ondansetron and Dexamethasone  Airway Management Planned: LMA  Additional Equipment:   Intra-op Plan:   Post-operative Plan:   Informed Consent: I have reviewed the patients History and Physical, chart,  labs and discussed the procedure including the risks, benefits and alternatives for the proposed anesthesia with the patient or authorized representative who has indicated his/her understanding and acceptance.     Plan Discussed with: CRNA  Anesthesia Plan Comments:         Anesthesia Quick Evaluation

## 2018-03-03 ENCOUNTER — Other Ambulatory Visit: Payer: Self-pay

## 2018-03-03 ENCOUNTER — Encounter (HOSPITAL_COMMUNITY): Payer: Self-pay

## 2018-03-03 ENCOUNTER — Ambulatory Visit (HOSPITAL_COMMUNITY): Payer: PPO | Admitting: Anesthesiology

## 2018-03-03 ENCOUNTER — Encounter (HOSPITAL_COMMUNITY)
Admission: AD | Disposition: A | Discharge status: Home / Self Care | Payer: Self-pay | Source: Ambulatory Visit | Attending: Obstetrics and Gynecology

## 2018-03-03 ENCOUNTER — Ambulatory Visit (HOSPITAL_COMMUNITY)
Admission: AD | Admit: 2018-03-03 | Discharge: 2018-03-03 | Disposition: A | Discharge status: Home / Self Care | Payer: PPO | Source: Ambulatory Visit | Attending: Obstetrics and Gynecology | Admitting: Obstetrics and Gynecology

## 2018-03-03 DIAGNOSIS — Z79890 Hormone replacement therapy (postmenopausal): Secondary | ICD-10-CM | POA: Diagnosis not present

## 2018-03-03 DIAGNOSIS — I10 Essential (primary) hypertension: Secondary | ICD-10-CM | POA: Diagnosis not present

## 2018-03-03 DIAGNOSIS — N888 Other specified noninflammatory disorders of cervix uteri: Secondary | ICD-10-CM | POA: Insufficient documentation

## 2018-03-03 DIAGNOSIS — N179 Acute kidney failure, unspecified: Secondary | ICD-10-CM | POA: Diagnosis not present

## 2018-03-03 DIAGNOSIS — R102 Pelvic and perineal pain: Secondary | ICD-10-CM | POA: Insufficient documentation

## 2018-03-03 DIAGNOSIS — R9389 Abnormal findings on diagnostic imaging of other specified body structures: Secondary | ICD-10-CM | POA: Insufficient documentation

## 2018-03-03 DIAGNOSIS — Z888 Allergy status to other drugs, medicaments and biological substances status: Secondary | ICD-10-CM | POA: Diagnosis not present

## 2018-03-03 DIAGNOSIS — E039 Hypothyroidism, unspecified: Secondary | ICD-10-CM | POA: Insufficient documentation

## 2018-03-03 DIAGNOSIS — Z881 Allergy status to other antibiotic agents status: Secondary | ICD-10-CM | POA: Diagnosis not present

## 2018-03-03 DIAGNOSIS — Z79899 Other long term (current) drug therapy: Secondary | ICD-10-CM | POA: Diagnosis not present

## 2018-03-03 DIAGNOSIS — Z87891 Personal history of nicotine dependence: Secondary | ICD-10-CM | POA: Diagnosis not present

## 2018-03-03 DIAGNOSIS — I4891 Unspecified atrial fibrillation: Secondary | ICD-10-CM | POA: Diagnosis not present

## 2018-03-03 DIAGNOSIS — N882 Stricture and stenosis of cervix uteri: Secondary | ICD-10-CM | POA: Diagnosis not present

## 2018-03-03 DIAGNOSIS — N8500 Endometrial hyperplasia, unspecified: Secondary | ICD-10-CM | POA: Diagnosis not present

## 2018-03-03 DIAGNOSIS — I480 Paroxysmal atrial fibrillation: Secondary | ICD-10-CM | POA: Diagnosis not present

## 2018-03-03 HISTORY — PX: HYSTEROSCOPY W/D&C: SHX1775

## 2018-03-03 SURGERY — DILATATION AND CURETTAGE /HYSTEROSCOPY
Anesthesia: General

## 2018-03-03 MED ORDER — ACETAMINOPHEN 10 MG/ML IV SOLN: 1000.0000 mg | Freq: Once | INTRAVENOUS | Status: DC | PRN

## 2018-03-03 MED ORDER — PROPOFOL 10 MG/ML IV BOLUS
INTRAVENOUS | Status: AC
  Filled 2018-03-03: qty 20

## 2018-03-03 MED ORDER — PROMETHAZINE HCL 25 MG/ML IJ SOLN: 6.2500 mg | INTRAMUSCULAR | Status: DC | PRN

## 2018-03-03 MED ORDER — ONDANSETRON HCL 4 MG/2ML IJ SOLN
INTRAMUSCULAR | Status: AC
  Filled 2018-03-03: qty 2

## 2018-03-03 MED ORDER — PROPOFOL 10 MG/ML IV BOLUS
INTRAVENOUS | Status: DC | PRN
  Administered 2018-03-03: 100 mg via INTRAVENOUS

## 2018-03-03 MED ORDER — LIDOCAINE HCL 1 % IJ SOLN
INTRAMUSCULAR | Status: DC | PRN
  Administered 2018-03-03: 5 mL

## 2018-03-03 MED ORDER — ONDANSETRON HCL 4 MG/2ML IJ SOLN
INTRAMUSCULAR | Status: DC | PRN
  Administered 2018-03-03: 4 mg via INTRAVENOUS

## 2018-03-03 MED ORDER — FENTANYL CITRATE (PF) 100 MCG/2ML IJ SOLN
INTRAMUSCULAR | Status: AC
  Filled 2018-03-03: qty 2

## 2018-03-03 MED ORDER — LIDOCAINE HCL 1 % IJ SOLN
INTRAMUSCULAR | Status: AC
  Filled 2018-03-03: qty 20

## 2018-03-03 MED ORDER — LIDOCAINE HCL (CARDIAC) PF 100 MG/5ML IV SOSY
PREFILLED_SYRINGE | INTRAVENOUS | Status: DC | PRN
  Administered 2018-03-03: 60 mg via INTRAVENOUS

## 2018-03-03 MED ORDER — LIDOCAINE HCL 2 % IJ SOLN
INTRAMUSCULAR | Status: AC
  Filled 2018-03-03: qty 20

## 2018-03-03 MED ORDER — MEPERIDINE HCL 25 MG/ML IJ SOLN: 6.2500 mg | INTRAMUSCULAR | Status: DC | PRN

## 2018-03-03 MED ORDER — HYDROCODONE-ACETAMINOPHEN 7.5-325 MG PO TABS: 1.0000 | ORAL_TABLET | Freq: Once | ORAL | Status: DC | PRN

## 2018-03-03 MED ORDER — SODIUM CHLORIDE 0.9 % IR SOLN
Status: DC | PRN
  Administered 2018-03-03: 3000 mL

## 2018-03-03 MED ORDER — LIDOCAINE HCL (CARDIAC) PF 100 MG/5ML IV SOSY
PREFILLED_SYRINGE | INTRAVENOUS | Status: AC
  Filled 2018-03-03: qty 5

## 2018-03-03 MED ORDER — ACETAMINOPHEN 325 MG PO TABS
ORAL_TABLET | ORAL | Status: AC
  Administered 2018-03-03: 650 mg via ORAL
  Filled 2018-03-03: qty 2

## 2018-03-03 MED ORDER — ACETAMINOPHEN 325 MG PO TABS
650.0000 mg | ORAL_TABLET | Freq: Four times a day (QID) | ORAL | Status: DC | PRN
  Administered 2018-03-03: 650 mg via ORAL

## 2018-03-03 MED ORDER — LACTATED RINGERS IV SOLN
INTRAVENOUS | Status: DC
  Administered 2018-03-03: 125 mL/h via INTRAVENOUS

## 2018-03-03 MED ORDER — DEXAMETHASONE SODIUM PHOSPHATE 4 MG/ML IJ SOLN
INTRAMUSCULAR | Status: AC
Start: 2018-03-03 — End: ?
  Filled 2018-03-03: qty 1

## 2018-03-03 MED ORDER — HYDROMORPHONE HCL 1 MG/ML IJ SOLN: 0.2500 mg | INTRAMUSCULAR | Status: DC | PRN

## 2018-03-03 MED ORDER — FENTANYL CITRATE (PF) 100 MCG/2ML IJ SOLN
INTRAMUSCULAR | Status: DC | PRN
  Administered 2018-03-03 (×2): 25 ug via INTRAVENOUS

## 2018-03-03 SURGICAL SUPPLY — 20 items
BIPOLAR CUTTING LOOP 21FR (ELECTRODE)
CANISTER SUCT 3000ML PPV (MISCELLANEOUS) ×3 IMPLANT
CATH ROBINSON RED A/P 16FR (CATHETERS) ×3 IMPLANT
ELECT COAG BIPOL BALL 21FR (ELECTRODE) IMPLANT
ELECT REM PT RETURN 9FT ADLT (ELECTROSURGICAL)
ELECTRODE REM PT RTRN 9FT ADLT (ELECTROSURGICAL) IMPLANT
GLOVE BIO SURGEON STRL SZ7 (GLOVE) ×3 IMPLANT
GLOVE BIOGEL PI IND STRL 7.0 (GLOVE) ×1 IMPLANT
GLOVE BIOGEL PI INDICATOR 7.0 (GLOVE) ×2
GOWN STRL REUS W/TWL LRG LVL3 (GOWN DISPOSABLE) ×6 IMPLANT
LOOP CUTTING BIPOLAR 21FR (ELECTRODE) IMPLANT
PACK VAGINAL MINOR WOMEN LF (CUSTOM PROCEDURE TRAY) ×3 IMPLANT
PAD OB MATERNITY 4.3X12.25 (PERSONAL CARE ITEMS) ×3 IMPLANT
PIPET BIOPSY ENDOMETRIAL 3MM (SUCTIONS) ×2 IMPLANT
TOWEL OR 17X24 6PK STRL BLUE (TOWEL DISPOSABLE) ×6 IMPLANT
TUBING AQUILEX INFLOW (TUBING) ×3 IMPLANT
TUBING AQUILEX OUTFLOW (TUBING) ×3 IMPLANT
TUBING CONNECTING 10 (TUBING) ×1 IMPLANT
TUBING CONNECTING 10' (TUBING) ×1
YANKAUER SUCT BULB TIP NO VENT (SUCTIONS) ×2 IMPLANT

## 2018-03-03 NOTE — Progress Notes (Signed)
Was R/S from last week (episode N/V/V)>>now feeling much better, no other changes

## 2018-03-03 NOTE — Anesthesia Procedure Notes (Signed)
Procedure Name: LMA Insertion Date/Time: 03/03/2018 1:17 PM Performed by: Barnet Glasgow, MD Pre-anesthesia Checklist: Patient identified, Patient being monitored, Emergency Drugs available, Timeout performed and Suction available Patient Re-evaluated:Patient Re-evaluated prior to induction Oxygen Delivery Method: Circle System Utilized Preoxygenation: Pre-oxygenation with 100% oxygen Induction Type: IV induction Ventilation: Mask ventilation without difficulty LMA: LMA inserted LMA Size: 4.0 Number of attempts: 1 Placement Confirmation: positive ETCO2 and breath sounds checked- equal and bilateral

## 2018-03-03 NOTE — Discharge Instructions (Addendum)
DISCHARGE INSTRUCTIONS: HYSTEROSCOPY / ENDOMETRIAL ABLATION The following instructions have been prepared to help you care for yourself upon your return home. May take stool softner while taking narcotic pain medication to prevent constipation.  Drink plenty of water.  Personal hygiene:  Use sanitary pads for vaginal drainage, not tampons.  Shower the day after your procedure.  NO tub baths, pools or Jacuzzis for 2-3 weeks.  Wipe front to back after using the bathroom.  Activity and limitations:  Do NOT drive or operate any equipment for 24 hours. The effects of anesthesia are still present and drowsiness may result.  Do NOT rest in bed all day.  Walking is encouraged.  Walk up and down stairs slowly.  You may resume your normal activity in one to two days or as indicated by your physician. Sexual activity: NO intercourse for at least 2 weeks after the procedure, or as indicated by your Doctor.  Diet: Eat a light meal as desired this evening. You may resume your usual diet tomorrow.  Return to Work: You may resume your work activities in one to two days or as indicated by Marine scientist.  What to expect after your surgery: Expect to have vaginal bleeding/discharge for 2-3 days and spotting for up to 10 days. It is not unusual to have soreness for up to 1-2 weeks. You may have a slight burning sensation when you urinate for the first day. Mild cramps may continue for a couple of days. You may have a regular period in 2-6 weeks.  Call your doctor for any of the following:  Excessive vaginal bleeding or clotting, saturating and changing one pad every hour.  Inability to urinate 6 hours after discharge from hospital.  Pain not relieved by pain medication.  Fever of 100.4 F or greater.  Unusual vaginal discharge or odor.   Post Anesthesia Care Unit [phone] Post Anesthesia Home Care Instructions  Activity: Get plenty of rest for the remainder of the day. A  responsible individual must stay with you for 24 hours following the procedure.  For the next 24 hours, DO NOT: -Drive a car -Paediatric nurse -Drink alcoholic beverages -Take any medication unless instructed by your physician -Make any legal decisions or sign important papers.  Meals: Start with liquid foods such as gelatin or soup. Progress to regular foods as tolerated. Avoid greasy, spicy, heavy foods. If nausea and/or vomiting occur, drink only clear liquids until the nausea and/or vomiting subsides. Call your physician if vomiting continues.  Special Instructions/Symptoms: Your throat may feel dry or sore from the anesthesia or the breathing tube placed in your throat during surgery. If this causes discomfort, gargle with warm salt water. The discomfort should disappear within 24 hours.   Post Anesthesia Home Care Instructions  Activity: Get plenty of rest for the remainder of the day. A responsible individual must stay with you for 24 hours following the procedure.  For the next 24 hours, DO NOT: -Drive a car -Paediatric nurse -Drink alcoholic beverages -Take any medication unless instructed by your physician -Make any legal decisions or sign important papers.  Meals: Start with liquid foods such as gelatin or soup. Progress to regular foods as tolerated. Avoid greasy, spicy, heavy foods. If nausea and/or vomiting occur, drink only clear liquids until the nausea and/or vomiting subsides. Call your physician if vomiting continues.  Special Instructions/Symptoms: Your throat may feel dry or sore from the anesthesia or the breathing tube placed in your throat during surgery. If this causes discomfort,  gargle with warm salt water. The discomfort should disappear within 24 hours.

## 2018-03-03 NOTE — Op Note (Signed)
Preoperative diagnosis: Endometrial buildup on recent CT scan, cervical stenosis  Postoperative diagnosis: Same  Procedure: Exam under anesthesia, D&C hysteroscopy with endometrial sampling  Surgeon: Matthew Saras  Anesthesia: General  EBL: Less than 5 cc, approximately 20 cc old blood within the uterus  Procedure and findings:  The patient taken to the operating room after an adequate level general anesthesia was obtained the patient's legs in stirrups the perineum and vagina were prepped and draped the bladder was drained.  Appropriate timeouts were taken at that point EUA was carried out the uterus was 8 weeks size slightly distended adnexa negative weighted speculum was positioned there was a tiniest pinpoint opening, the issue that precluded office sampling was severe cervical stenosis.  I tried to probe with a Allis finder and also tear duct probe initially, ultimately the pinhole area that looked like purplish behind it, was penetrated with a spinal needle releasing some old blood, this gave some direction to a tear duct probe which released a lot of old blood ultimately uterus sounded to 8 cm was dilated to 25 Pratt and up to allow the continuous-flow hysteroscope to irrigate the cavity revealing to be clean endometrial biopsy both with Pip[place] and sharp curettage all sent as endometrial sampling although the cavity and the endometrium was then otherwise normal.  She tolerated this well went to recovery room in good condition.  Dictated with [employer] 1  Debbie Asal MD

## 2018-03-03 NOTE — Anesthesia Postprocedure Evaluation (Signed)
Anesthesia Post Note  Patient: Ermal A Edmondson  Procedure(s) Performed: DILATATION AND CURETTAGE /HYSTEROSCOPY (N/A )     Patient location during evaluation: PACU Anesthesia Type: General Level of consciousness: awake and alert Pain management: pain level controlled Vital Signs Assessment: post-procedure vital signs reviewed and stable Respiratory status: spontaneous breathing, nonlabored ventilation, respiratory function stable and patient connected to nasal cannula oxygen Cardiovascular status: blood pressure returned to baseline and stable Postop Assessment: no apparent nausea or vomiting Anesthetic complications: no    Last Vitals:  Vitals:   03/03/18 1445 03/03/18 1528  BP:  (!) 154/70  Pulse: 63 65  Resp: (!) 22 16  Temp: 36.8 C 36.6 C  SpO2: 95% 96%    Last Pain:  Vitals:   03/03/18 1528  TempSrc:   PainSc: 0-No pain   Pain Goal: Patients Stated Pain Goal: 3 (03/03/18 1445)               Barnet Glasgow

## 2018-03-03 NOTE — Transfer of Care (Signed)
Immediate Anesthesia Transfer of Care Note  Patient: Debbie Rubio  Procedure(s) Performed: DILATATION AND CURETTAGE /HYSTEROSCOPY (N/A )  Patient Location: PACU  Anesthesia Type:General  Level of Consciousness: awake, alert  and oriented  Airway & Oxygen Therapy: Patient Spontanous Breathing and Patient connected to nasal cannula oxygen  Post-op Assessment: Report given to RN, Post -op Vital signs reviewed and stable and Patient moving all extremities X 4  Post vital signs: Reviewed and stable  Last Vitals:  Vitals Value Taken Time  BP 153/69 03/03/2018  1:52 PM  Temp    Pulse 61 03/03/2018  1:54 PM  Resp 15 03/03/2018  1:54 PM  SpO2 100 % 03/03/2018  1:54 PM  Vitals shown include unvalidated device data.  Last Pain:  Vitals:   03/03/18 1139  TempSrc: Oral  PainSc: 2       Patients Stated Pain Goal: 3 (07/18/84 2778)  Complications: No apparent anesthesia complications

## 2018-03-04 ENCOUNTER — Encounter (HOSPITAL_COMMUNITY): Payer: Self-pay | Admitting: Obstetrics and Gynecology

## 2018-03-10 DIAGNOSIS — E039 Hypothyroidism, unspecified: Secondary | ICD-10-CM | POA: Diagnosis not present

## 2018-03-10 DIAGNOSIS — Z9189 Other specified personal risk factors, not elsewhere classified: Secondary | ICD-10-CM | POA: Diagnosis not present

## 2018-06-11 DIAGNOSIS — Z6827 Body mass index (BMI) 27.0-27.9, adult: Secondary | ICD-10-CM | POA: Diagnosis not present

## 2018-06-11 DIAGNOSIS — E039 Hypothyroidism, unspecified: Secondary | ICD-10-CM | POA: Diagnosis not present

## 2018-06-11 DIAGNOSIS — I480 Paroxysmal atrial fibrillation: Secondary | ICD-10-CM | POA: Diagnosis not present

## 2018-06-11 DIAGNOSIS — M1711 Unilateral primary osteoarthritis, right knee: Secondary | ICD-10-CM | POA: Diagnosis not present

## 2018-06-11 DIAGNOSIS — K219 Gastro-esophageal reflux disease without esophagitis: Secondary | ICD-10-CM | POA: Diagnosis not present

## 2018-11-12 DIAGNOSIS — H04123 Dry eye syndrome of bilateral lacrimal glands: Secondary | ICD-10-CM | POA: Diagnosis not present

## 2018-11-12 DIAGNOSIS — H40033 Anatomical narrow angle, bilateral: Secondary | ICD-10-CM | POA: Diagnosis not present

## 2019-03-11 DIAGNOSIS — M1711 Unilateral primary osteoarthritis, right knee: Secondary | ICD-10-CM | POA: Diagnosis not present

## 2019-03-11 DIAGNOSIS — E039 Hypothyroidism, unspecified: Secondary | ICD-10-CM | POA: Diagnosis not present

## 2019-03-11 DIAGNOSIS — Z6828 Body mass index (BMI) 28.0-28.9, adult: Secondary | ICD-10-CM | POA: Diagnosis not present

## 2019-03-11 DIAGNOSIS — I480 Paroxysmal atrial fibrillation: Secondary | ICD-10-CM | POA: Diagnosis not present

## 2019-03-11 DIAGNOSIS — Z0001 Encounter for general adult medical examination with abnormal findings: Secondary | ICD-10-CM | POA: Diagnosis not present

## 2019-03-11 DIAGNOSIS — Z1212 Encounter for screening for malignant neoplasm of rectum: Secondary | ICD-10-CM | POA: Diagnosis not present

## 2019-03-11 DIAGNOSIS — K219 Gastro-esophageal reflux disease without esophagitis: Secondary | ICD-10-CM | POA: Diagnosis not present

## 2019-03-11 DIAGNOSIS — Z23 Encounter for immunization: Secondary | ICD-10-CM | POA: Diagnosis not present

## 2019-04-14 DIAGNOSIS — Z1231 Encounter for screening mammogram for malignant neoplasm of breast: Secondary | ICD-10-CM | POA: Diagnosis not present

## 2019-09-09 DIAGNOSIS — Z6828 Body mass index (BMI) 28.0-28.9, adult: Secondary | ICD-10-CM | POA: Diagnosis not present

## 2019-09-09 DIAGNOSIS — K219 Gastro-esophageal reflux disease without esophagitis: Secondary | ICD-10-CM | POA: Diagnosis not present

## 2019-09-09 DIAGNOSIS — E039 Hypothyroidism, unspecified: Secondary | ICD-10-CM | POA: Diagnosis not present

## 2019-09-09 DIAGNOSIS — I480 Paroxysmal atrial fibrillation: Secondary | ICD-10-CM | POA: Diagnosis not present

## 2019-09-09 DIAGNOSIS — M1711 Unilateral primary osteoarthritis, right knee: Secondary | ICD-10-CM | POA: Diagnosis not present

## 2019-09-25 DIAGNOSIS — I639 Cerebral infarction, unspecified: Secondary | ICD-10-CM

## 2019-09-25 HISTORY — DX: Cerebral infarction, unspecified: I63.9

## 2019-10-06 ENCOUNTER — Ambulatory Visit: Payer: PPO | Attending: Internal Medicine

## 2019-10-06 ENCOUNTER — Other Ambulatory Visit: Payer: Self-pay

## 2019-10-06 DIAGNOSIS — Z20822 Contact with and (suspected) exposure to covid-19: Secondary | ICD-10-CM

## 2019-10-07 LAB — NOVEL CORONAVIRUS, NAA: SARS-CoV-2, NAA: NOT DETECTED

## 2020-03-15 DIAGNOSIS — E039 Hypothyroidism, unspecified: Secondary | ICD-10-CM | POA: Diagnosis not present

## 2020-03-15 DIAGNOSIS — Z1212 Encounter for screening for malignant neoplasm of rectum: Secondary | ICD-10-CM | POA: Diagnosis not present

## 2020-03-15 DIAGNOSIS — M1711 Unilateral primary osteoarthritis, right knee: Secondary | ICD-10-CM | POA: Diagnosis not present

## 2020-03-15 DIAGNOSIS — Z23 Encounter for immunization: Secondary | ICD-10-CM | POA: Diagnosis not present

## 2020-03-15 DIAGNOSIS — Z0001 Encounter for general adult medical examination with abnormal findings: Secondary | ICD-10-CM | POA: Diagnosis not present

## 2020-03-15 DIAGNOSIS — H01009 Unspecified blepharitis unspecified eye, unspecified eyelid: Secondary | ICD-10-CM | POA: Diagnosis not present

## 2020-03-15 DIAGNOSIS — K219 Gastro-esophageal reflux disease without esophagitis: Secondary | ICD-10-CM | POA: Diagnosis not present

## 2020-03-15 DIAGNOSIS — I480 Paroxysmal atrial fibrillation: Secondary | ICD-10-CM | POA: Diagnosis not present

## 2020-03-15 DIAGNOSIS — Z6829 Body mass index (BMI) 29.0-29.9, adult: Secondary | ICD-10-CM | POA: Diagnosis not present

## 2020-05-31 DIAGNOSIS — Z1231 Encounter for screening mammogram for malignant neoplasm of breast: Secondary | ICD-10-CM | POA: Diagnosis not present

## 2020-08-24 DIAGNOSIS — H00021 Hordeolum internum right upper eyelid: Secondary | ICD-10-CM | POA: Diagnosis not present

## 2020-09-14 DIAGNOSIS — Z6829 Body mass index (BMI) 29.0-29.9, adult: Secondary | ICD-10-CM | POA: Diagnosis not present

## 2020-09-14 DIAGNOSIS — K2100 Gastro-esophageal reflux disease with esophagitis, without bleeding: Secondary | ICD-10-CM | POA: Diagnosis not present

## 2020-09-14 DIAGNOSIS — M1711 Unilateral primary osteoarthritis, right knee: Secondary | ICD-10-CM | POA: Diagnosis not present

## 2020-09-14 DIAGNOSIS — I480 Paroxysmal atrial fibrillation: Secondary | ICD-10-CM | POA: Diagnosis not present

## 2020-09-14 DIAGNOSIS — E039 Hypothyroidism, unspecified: Secondary | ICD-10-CM | POA: Diagnosis not present

## 2020-12-06 DIAGNOSIS — H0015 Chalazion left lower eyelid: Secondary | ICD-10-CM | POA: Diagnosis not present

## 2021-01-05 DIAGNOSIS — H0011 Chalazion right upper eyelid: Secondary | ICD-10-CM | POA: Diagnosis not present

## 2021-01-20 DIAGNOSIS — M25562 Pain in left knee: Secondary | ICD-10-CM | POA: Diagnosis not present

## 2021-01-22 DIAGNOSIS — I69319 Unspecified symptoms and signs involving cognitive functions following cerebral infarction: Secondary | ICD-10-CM

## 2021-01-22 HISTORY — DX: Unspecified symptoms and signs involving cognitive functions following cerebral infarction: I69.319

## 2021-02-15 ENCOUNTER — Encounter (HOSPITAL_COMMUNITY): Payer: Self-pay

## 2021-02-15 ENCOUNTER — Emergency Department (HOSPITAL_COMMUNITY): Payer: PPO

## 2021-02-15 ENCOUNTER — Other Ambulatory Visit: Payer: Self-pay

## 2021-02-15 ENCOUNTER — Inpatient Hospital Stay (HOSPITAL_COMMUNITY)
Admission: EM | Admit: 2021-02-15 | Discharge: 2021-02-17 | DRG: 065 | Disposition: A | Discharge status: Home Health | Payer: PPO | Attending: Family Medicine | Admitting: Family Medicine

## 2021-02-15 DIAGNOSIS — M19041 Primary osteoarthritis, right hand: Secondary | ICD-10-CM | POA: Diagnosis present

## 2021-02-15 DIAGNOSIS — R29898 Other symptoms and signs involving the musculoskeletal system: Secondary | ICD-10-CM | POA: Diagnosis not present

## 2021-02-15 DIAGNOSIS — R29810 Facial weakness: Secondary | ICD-10-CM | POA: Diagnosis present

## 2021-02-15 DIAGNOSIS — Z6827 Body mass index (BMI) 27.0-27.9, adult: Secondary | ICD-10-CM | POA: Diagnosis not present

## 2021-02-15 DIAGNOSIS — R29707 NIHSS score 7: Secondary | ICD-10-CM | POA: Diagnosis present

## 2021-02-15 DIAGNOSIS — Z7982 Long term (current) use of aspirin: Secondary | ICD-10-CM

## 2021-02-15 DIAGNOSIS — E785 Hyperlipidemia, unspecified: Secondary | ICD-10-CM | POA: Diagnosis not present

## 2021-02-15 DIAGNOSIS — I69952 Hemiplegia and hemiparesis following unspecified cerebrovascular disease affecting left dominant side: Secondary | ICD-10-CM | POA: Diagnosis not present

## 2021-02-15 DIAGNOSIS — E039 Hypothyroidism, unspecified: Secondary | ICD-10-CM | POA: Diagnosis present

## 2021-02-15 DIAGNOSIS — I10 Essential (primary) hypertension: Secondary | ICD-10-CM | POA: Diagnosis present

## 2021-02-15 DIAGNOSIS — H5340 Unspecified visual field defects: Secondary | ICD-10-CM | POA: Diagnosis present

## 2021-02-15 DIAGNOSIS — I480 Paroxysmal atrial fibrillation: Secondary | ICD-10-CM | POA: Diagnosis present

## 2021-02-15 DIAGNOSIS — I639 Cerebral infarction, unspecified: Secondary | ICD-10-CM | POA: Diagnosis not present

## 2021-02-15 DIAGNOSIS — I6389 Other cerebral infarction: Secondary | ICD-10-CM

## 2021-02-15 DIAGNOSIS — I4821 Permanent atrial fibrillation: Secondary | ICD-10-CM

## 2021-02-15 DIAGNOSIS — E669 Obesity, unspecified: Secondary | ICD-10-CM | POA: Diagnosis present

## 2021-02-15 DIAGNOSIS — E7800 Pure hypercholesterolemia, unspecified: Secondary | ICD-10-CM | POA: Diagnosis not present

## 2021-02-15 DIAGNOSIS — R1310 Dysphagia, unspecified: Secondary | ICD-10-CM | POA: Diagnosis not present

## 2021-02-15 DIAGNOSIS — R0689 Other abnormalities of breathing: Secondary | ICD-10-CM | POA: Diagnosis not present

## 2021-02-15 DIAGNOSIS — Z8249 Family history of ischemic heart disease and other diseases of the circulatory system: Secondary | ICD-10-CM

## 2021-02-15 DIAGNOSIS — R4781 Slurred speech: Secondary | ICD-10-CM | POA: Diagnosis not present

## 2021-02-15 DIAGNOSIS — R4701 Aphasia: Secondary | ICD-10-CM | POA: Diagnosis present

## 2021-02-15 DIAGNOSIS — Z79890 Hormone replacement therapy (postmenopausal): Secondary | ICD-10-CM | POA: Diagnosis not present

## 2021-02-15 DIAGNOSIS — G8194 Hemiplegia, unspecified affecting left nondominant side: Secondary | ICD-10-CM | POA: Diagnosis present

## 2021-02-15 DIAGNOSIS — I6523 Occlusion and stenosis of bilateral carotid arteries: Secondary | ICD-10-CM | POA: Diagnosis not present

## 2021-02-15 DIAGNOSIS — R55 Syncope and collapse: Secondary | ICD-10-CM | POA: Diagnosis not present

## 2021-02-15 DIAGNOSIS — I4891 Unspecified atrial fibrillation: Secondary | ICD-10-CM | POA: Diagnosis present

## 2021-02-15 DIAGNOSIS — W19XXXA Unspecified fall, initial encounter: Secondary | ICD-10-CM | POA: Diagnosis not present

## 2021-02-15 DIAGNOSIS — I63511 Cerebral infarction due to unspecified occlusion or stenosis of right middle cerebral artery: Principal | ICD-10-CM | POA: Diagnosis present

## 2021-02-15 DIAGNOSIS — R296 Repeated falls: Secondary | ICD-10-CM | POA: Diagnosis not present

## 2021-02-15 DIAGNOSIS — Z8701 Personal history of pneumonia (recurrent): Secondary | ICD-10-CM

## 2021-02-15 DIAGNOSIS — Z20822 Contact with and (suspected) exposure to covid-19: Secondary | ICD-10-CM | POA: Diagnosis not present

## 2021-02-15 DIAGNOSIS — K219 Gastro-esophageal reflux disease without esophagitis: Secondary | ICD-10-CM | POA: Diagnosis not present

## 2021-02-15 DIAGNOSIS — Z881 Allergy status to other antibiotic agents status: Secondary | ICD-10-CM

## 2021-02-15 DIAGNOSIS — E038 Other specified hypothyroidism: Secondary | ICD-10-CM | POA: Diagnosis not present

## 2021-02-15 DIAGNOSIS — I6930 Unspecified sequelae of cerebral infarction: Secondary | ICD-10-CM

## 2021-02-15 DIAGNOSIS — Z791 Long term (current) use of non-steroidal anti-inflammatories (NSAID): Secondary | ICD-10-CM

## 2021-02-15 DIAGNOSIS — M19042 Primary osteoarthritis, left hand: Secondary | ICD-10-CM | POA: Diagnosis present

## 2021-02-15 DIAGNOSIS — M170 Bilateral primary osteoarthritis of knee: Secondary | ICD-10-CM | POA: Diagnosis present

## 2021-02-15 DIAGNOSIS — Z87440 Personal history of urinary (tract) infections: Secondary | ICD-10-CM

## 2021-02-15 DIAGNOSIS — Z888 Allergy status to other drugs, medicaments and biological substances status: Secondary | ICD-10-CM

## 2021-02-15 DIAGNOSIS — H02402 Unspecified ptosis of left eyelid: Secondary | ICD-10-CM | POA: Diagnosis not present

## 2021-02-15 DIAGNOSIS — Z972 Presence of dental prosthetic device (complete) (partial): Secondary | ICD-10-CM

## 2021-02-15 DIAGNOSIS — R531 Weakness: Secondary | ICD-10-CM | POA: Diagnosis not present

## 2021-02-15 DIAGNOSIS — Z87891 Personal history of nicotine dependence: Secondary | ICD-10-CM

## 2021-02-15 DIAGNOSIS — G936 Cerebral edema: Secondary | ICD-10-CM | POA: Diagnosis present

## 2021-02-15 DIAGNOSIS — R4182 Altered mental status, unspecified: Secondary | ICD-10-CM | POA: Diagnosis not present

## 2021-02-15 DIAGNOSIS — Z79899 Other long term (current) drug therapy: Secondary | ICD-10-CM

## 2021-02-15 HISTORY — DX: Unspecified sequelae of cerebral infarction: I69.30

## 2021-02-15 LAB — ETHANOL: Alcohol, Ethyl (B): <10 mg/dL (ref <10)

## 2021-02-15 LAB — URINALYSIS, ROUTINE W REFLEX MICROSCOPIC
Bacteria, UA: NONE SEEN
Bilirubin Urine: NEGATIVE
Glucose, UA: NEGATIVE mg/dL
Ketones, ur: NEGATIVE mg/dL
Leukocytes,Ua: NEGATIVE
Nitrite: NEGATIVE
Protein, ur: 30 mg/dL — AB
RBC / HPF: >50 RBC/hpf — ABNORMAL HIGH (ref 0–5)
Specific Gravity, Urine: 1.024 (ref 1.005–1.030)
pH: 5 (ref 5.0–8.0)

## 2021-02-15 LAB — COMPREHENSIVE METABOLIC PANEL
ALT: 23 U/L (ref 0–44)
AST: 32 U/L (ref 15–41)
Albumin: 3.9 g/dL (ref 3.5–5.0)
Alkaline Phosphatase: 76 U/L (ref 38–126)
Anion gap: 9 (ref 5–15)
BUN: 26 mg/dL — ABNORMAL HIGH (ref 8–23)
CO2: 26 mmol/L (ref 22–32)
Calcium: 9.2 mg/dL (ref 8.9–10.3)
Chloride: 102 mmol/L (ref 98–111)
Creatinine, Ser: 0.85 mg/dL (ref 0.44–1.00)
GFR, Estimated: >60 mL/min (ref >60)
Glucose, Bld: 112 mg/dL — ABNORMAL HIGH (ref 70–99)
Potassium: 3.9 mmol/L (ref 3.5–5.1)
Sodium: 137 mmol/L (ref 135–145)
Total Bilirubin: 2.3 mg/dL — ABNORMAL HIGH (ref 0.3–1.2)
Total Protein: 8.2 g/dL — ABNORMAL HIGH (ref 6.5–8.1)

## 2021-02-15 LAB — RAPID URINE DRUG SCREEN, HOSP PERFORMED
Amphetamines: NOT DETECTED
Barbiturates: NOT DETECTED
Benzodiazepines: NOT DETECTED
Cocaine: NOT DETECTED
Opiates: NOT DETECTED
Tetrahydrocannabinol: NOT DETECTED

## 2021-02-15 LAB — CBC
HCT: 43.8 % (ref 36.0–46.0)
Hemoglobin: 13.7 g/dL (ref 12.0–15.0)
MCH: 27 pg (ref 26.0–34.0)
MCHC: 31.3 g/dL (ref 30.0–36.0)
MCV: 86.2 fL (ref 80.0–100.0)
Platelets: 285 10*3/uL (ref 150–400)
RBC: 5.08 MIL/uL (ref 3.87–5.11)
RDW: 14.2 % (ref 11.5–15.5)
WBC: 12.3 10*3/uL — ABNORMAL HIGH (ref 4.0–10.5)
nRBC: 0 % (ref 0.0–0.2)

## 2021-02-15 LAB — RESP PANEL BY RT-PCR (FLU A&B, COVID) ARPGX2
Influenza A by PCR: NEGATIVE
Influenza B by PCR: NEGATIVE
SARS Coronavirus 2 by RT PCR: NEGATIVE

## 2021-02-15 LAB — DIFFERENTIAL
Abs Immature Granulocytes: 0.06 10*3/uL (ref 0.00–0.07)
Basophils Absolute: 0 10*3/uL (ref 0.0–0.1)
Basophils Relative: 0 %
Eosinophils Absolute: 0.1 10*3/uL (ref 0.0–0.5)
Eosinophils Relative: 0 %
Immature Granulocytes: 1 %
Lymphocytes Relative: 17 %
Lymphs Abs: 2.1 10*3/uL (ref 0.7–4.0)
Monocytes Absolute: 0.9 10*3/uL (ref 0.1–1.0)
Monocytes Relative: 7 %
Neutro Abs: 9.2 10*3/uL — ABNORMAL HIGH (ref 1.7–7.7)
Neutrophils Relative %: 75 %

## 2021-02-15 LAB — APTT: aPTT: 27 seconds (ref 24–36)

## 2021-02-15 LAB — PROTIME-INR
INR: 1.1 (ref 0.8–1.2)
Prothrombin Time: 14.6 seconds (ref 11.4–15.2)

## 2021-02-15 MED ORDER — ASPIRIN 300 MG RE SUPP
300.0000 mg | Freq: Every day | RECTAL | Status: DC
  Administered 2021-02-16: 300 mg via RECTAL
  Filled 2021-02-15: qty 1

## 2021-02-15 MED ORDER — ACETAMINOPHEN 650 MG RE SUPP: 650.0000 mg | RECTAL | Status: DC | PRN

## 2021-02-15 MED ORDER — SODIUM CHLORIDE 0.9 % IV SOLN: INTRAVENOUS | Status: DC

## 2021-02-15 MED ORDER — ASPIRIN 325 MG PO TABS
325.0000 mg | ORAL_TABLET | Freq: Every day | ORAL | Status: DC
  Administered 2021-02-17: 325 mg via ORAL
  Filled 2021-02-15 (×2): qty 1

## 2021-02-15 MED ORDER — ACETAMINOPHEN 325 MG PO TABS: 650.0000 mg | ORAL_TABLET | ORAL | Status: DC | PRN

## 2021-02-15 MED ORDER — LEVOTHYROXINE SODIUM 25 MCG PO TABS
125.0000 ug | ORAL_TABLET | Freq: Every day | ORAL | Status: DC
  Administered 2021-02-17: 125 ug via ORAL
  Filled 2021-02-15: qty 1

## 2021-02-15 MED ORDER — STROKE: EARLY STAGES OF RECOVERY BOOK
Freq: Once | Status: DC
  Filled 2021-02-15: qty 1

## 2021-02-15 MED ORDER — ACETAMINOPHEN 160 MG/5ML PO SOLN: 650.0000 mg | ORAL | Status: DC | PRN

## 2021-02-15 MED ORDER — DIGOXIN 125 MCG PO TABS
250.0000 ug | ORAL_TABLET | Freq: Every day | ORAL | Status: DC
  Administered 2021-02-17: 250 ug via ORAL
  Filled 2021-02-15 (×2): qty 2

## 2021-02-15 MED ORDER — ASPIRIN 300 MG RE SUPP
300.0000 mg | Freq: Once | RECTAL | Status: AC
  Administered 2021-02-15: 300 mg via RECTAL
  Filled 2021-02-15: qty 1

## 2021-02-15 MED ORDER — HEPARIN SODIUM (PORCINE) 5000 UNIT/ML IJ SOLN
5000.0000 [IU] | Freq: Three times a day (TID) | INTRAMUSCULAR | Status: DC
  Administered 2021-02-16 (×3): 5000 [IU] via SUBCUTANEOUS
  Filled 2021-02-15 (×4): qty 1

## 2021-02-15 NOTE — ED Notes (Signed)
Patient transported to CT 

## 2021-02-15 NOTE — ED Notes (Signed)
Etta Quill NP at bedside prior to triage complete for [facility]

## 2021-02-15 NOTE — ED Notes (Signed)
Pt cleaned. New linens, brief and blanket provided. Purewick applied per request. Pt repositioned in bed for comfort and denied any other needs at this time. Call bell within reach, bed in low position. Will continue to monitor.

## 2021-02-15 NOTE — ED Notes (Signed)
Patient has vaginal bleeding, provider notified.

## 2021-02-15 NOTE — H&P (Addendum)
TRH H&P   Patient Demographics:    Debbie Rubio, is a 80 y.o. female  MRN: [id number]   DOB - 07-10-1941  Admit Date - 02/15/2021  Outpatient Primary MD for the patient is Caryl Bis, MD  Referring MD/NP/PA: Darden Dates  Patient coming from: home  Chief Complaint  Patient presents with  . Weakness      HPI:    Debbie Rubio  is a 80 y.o. female, past medical history of atrial fibrillation, only on aspirin, hypothyroidism, hypertension, she presents to ED with left-sided facial droop, left eyelid ptosis, slurred speech, and visual deficits on the left, reports she fell in the bathroom today, struck the right side of her head, she is herself unaware of these deficits, she thinks she woke up at her usual health, reports she fell in the bathroom in the morning unsure 7 or 9 AM, reports she was not able to stand up, family tried to reach her, when they were unable, son went to see her, found her on the floor in the afternoon, where she was brought by EMS, she denies fever, chills, chest pain, nausea, vomiting or diarrhea. - in ED CT head significant for acute CVA, areas of low-density in the right cerebral hemisphere compatible with cytotoxic edema and recent infarct, no evidence of acute hemorrhage, [facility]  hospitalist consulted to admit.   Review of systems:    In addition to the HPI above,  No Fever-chills, No Headache, she does report vision disturbances Difficulty swallowing, left facial droop and slurred speech No Chest pain, Cough or Shortness of Breath, No Abdominal pain, No Nausea or Vommitting, Bowel movements are regular, No Blood in stool or Urine, No dysuria, No new skin rashes or bruises, No new joints pains-aches,  No recent weight gain or loss, No polyuria, polydypsia or polyphagia, No significant Mental Stressors.  A full 10 point Review of Systems was  done, except as stated above, all other Review of Systems were negative.   With Past History of the following :    Past Medical History:  Diagnosis Date  . Anemia    YEARS AGO EARLY 20'S  . Arthritis    BILATERAL HANDS,KNEES  . Back spasm    LEFT LOWER BACK  . Biatrial enlargement   . Bronchitis    22 YEARS AGO, IF SHE GETS A COLD IT TURNS INTO BRONCHITIS - USES INHALER  . Dyspnea    with exercise  . GERD (gastroesophageal reflux disease)    NOT RECENTLY-NO MEDS  . H/O: hypothyroidism   . Hypertension   . Hypothyroidism   . Paroxysmal atrial fibrillation (HCC)   . Pneumonia 11/2015   only time every had pneumonia  . SVD (spontaneous vaginal delivery)    x 3  . Thyroid disease   . Urinary tract infection   . Wears partial dentures    upper  Past Surgical History:  Procedure Laterality Date  . DILATION AND CURETTAGE OF UTERUS    . EYE SURGERY     BURNED BILATERAL TEAR DUCTS  . HYSTEROSCOPY WITH D & C N/A 03/03/2018   Procedure: DILATATION AND CURETTAGE /HYSTEROSCOPY;  Surgeon: Molli Posey, MD;  Location: [facility];  Service: Gynecology;  Laterality: N/A;  patient was originally on 6/3, but was sick  . MULTIPLE TOOTH EXTRACTIONS     upper teeth      Social History:     Social History   Tobacco Use  . Smoking status: Former Smoker    Packs/day: 0.75    Years: 28.00    Pack years: 21.00    Types: Cigarettes    Quit date: 1988    Years since quitting: 34.4  . Smokeless tobacco: Never Used  Substance Use Topics  . Alcohol use: No     Family History :     Family History  Problem Relation Age of Onset  . Heart attack Father   . Heart disease Mother      Home Medications:   Prior to Admission medications   Medication Sig Start Date End Date Taking? Authorizing Provider  albuterol (PROVENTIL HFA;VENTOLIN HFA) 108 (90 Base) MCG/ACT inhaler Inhale 1-2 puffs into the lungs every 6 (six) hours as needed for wheezing or shortness of breath.   Yes  [provider]  aspirin 81 MG chewable tablet Chew 81 mg by mouth daily.   Yes [provider]  digoxin (LANOXIN) 0.25 MG tablet Take 250 mcg by mouth daily.   Yes [provider]  guaiFENesin (MUCINEX) 600 MG 12 hr tablet Take 600 mg by mouth 2 (two) times daily.   Yes [provider]  indapamide (LOZOL) 2.5 MG tablet Take 2.5 mg by mouth See admin instructions. Take 1/2 tablet daily 12/06/20  Yes [provider]  levothyroxine (SYNTHROID) 137 MCG tablet Take 137 mcg by mouth daily. 01/10/21  Yes [provider]  losartan (COZAAR) 100 MG tablet Take 100 mg by mouth daily.   Yes [provider]  meloxicam (MOBIC) 15 MG tablet Take 1 tablet by mouth daily. 01/20/21  Yes [provider]  pindolol (VISKEN) 5 MG tablet Take 2.5 mg by mouth daily. One half tablet daily   Yes [provider]  traMADol (ULTRAM) 50 MG tablet Take 25 mg by mouth every 6 (six) hours as needed.   Yes [provider]  HYDROcodone-acetaminophen (NORCO/VICODIN) 5-325 MG tablet Take 1 tablet by mouth every 6 (six) hours as needed. Patient not taking: No sig reported 01/26/18   Frederica Kuster, PA-C  levothyroxine (SYNTHROID, LEVOTHROID) 125 MCG tablet Take 125 mcg by mouth daily before breakfast. Patient not taking: Reported on 02/15/2021    [provider]     Allergies:     Allergies  Allergen Reactions  . Beta Adrenergic Blockers     REACTION: bradycardia and hypotension. Pt reports she is sensitive to the high doses, but she can take the lower doses.  . Flecainide Swelling  . Levaquin [Levofloxacin] Other (See Comments)    bradycardia  . Metoprolol Succinate     REACTION: severe hypotension  . Prednisone Other (See Comments)    Pt states she cannot take due to afib  . Pseudoephedrine     REACTION: causes tachycardia     Physical Exam:   Vitals  Blood pressure (!) 159/67, pulse 86, temperature 98.8 F (37.1 C),  temperature source Oral, resp.  rate 18, height 5' 5.5" (1.664 m), weight 77.1 kg, SpO2 94 %.   1. General female, lying in bed, no apparent distress  2. Normal affect and insight, Not Suicidal or Homicidal, Awake Alert, Oriented X 3.  3. No F.N deficits, ALL C.Nerves Intact, patient with left facial droop, no gross motor deficits .  4.  Mild left facial droop PERRLA. Moist Oral Mucosa.  5. Supple Neck, No JVD, No cervical lymphadenopathy appriciated, No Carotid Bruits.  6. Symmetrical Chest wall movement, Good air movement bilaterally, CTAB.  7.  Irregular irregular, No Gallops, Rubs or Murmurs, No Parasternal Heave.  8. Positive Bowel Sounds, Abdomen Soft, No tenderness, No organomegaly appriciated,No rebound -guarding or rigidity.  9.  No Cyanosis, Normal Skin Turgor, No Skin Rash or Bruise.  10. Good muscle tone,  joints appear normal , no effusions, Normal ROM.  11. No Palpable Lymph Nodes in Neck or Axillae    Data Review:    CBC Recent Labs  Lab 02/15/21 1535  WBC 12.3*  HGB 13.7  HCT 43.8  PLT 285  MCV 86.2  MCH 27.0  MCHC 31.3  RDW 14.2  LYMPHSABS 2.1  MONOABS 0.9  EOSABS 0.1  BASOSABS 0.0   ------------------------------------------------------------------------------------------------------------------  Chemistries  Recent Labs  Lab 02/15/21 1535  NA 137  K 3.9  CL 102  CO2 26  GLUCOSE 112*  BUN 26*  CREATININE 0.85  CALCIUM 9.2  AST 32  ALT 23  ALKPHOS 76  BILITOT 2.3*   ------------------------------------------------------------------------------------------------------------------ estimated creatinine clearance is 56.7 mL/min (by C-G formula based on SCr of 0.85 mg/dL). ------------------------------------------------------------------------------------------------------------------ No results for input(s): TSH, T4TOTAL, T3FREE, THYROIDAB in the last 72 hours.  Invalid input(s): FREET3  Coagulation profile Recent Labs  Lab  02/15/21 1535  INR 1.1   ------------------------------------------------------------------------------------------------------------------- No results for input(s): DDIMER in the last 72 hours. -------------------------------------------------------------------------------------------------------------------  Cardiac Enzymes No results for input(s): CKMB, TROPONINI, MYOGLOBIN in the last 168 hours.  Invalid input(s): CK ------------------------------------------------------------------------------------------------------------------ No results found for: BNP   ---------------------------------------------------------------------------------------------------------------  Urinalysis    Component Value Date/Time   COLORURINE YELLOW 02/15/2021 1535   APPEARANCEUR HAZY (A) 02/15/2021 1535   LABSPEC 1.024 02/15/2021 1535   PHURINE 5.0 02/15/2021 1535   GLUCOSEU NEGATIVE 02/15/2021 1535   HGBUR LARGE (A) 02/15/2021 1535   BILIRUBINUR NEGATIVE 02/15/2021 1535   KETONESUR NEGATIVE 02/15/2021 1535   PROTEINUR 30 (A) 02/15/2021 1535   NITRITE NEGATIVE 02/15/2021 1535   LEUKOCYTESUR NEGATIVE 02/15/2021 1535    ----------------------------------------------------------------------------------------------------------------   Imaging Results:    CT HEAD WO CONTRAST  Result Date: 02/15/2021 CLINICAL DATA:  80 year old with left facial droop. Left-sided weakness. EXAM: CT HEAD WITHOUT CONTRAST TECHNIQUE: Contiguous axial images were obtained from the base of the skull through the vertex without intravenous contrast. COMPARISON:  None. FINDINGS: Brain: Areas of low-density in the right temporoparietal region. Areas of low-density in the right frontal-parietal region. Mild effacement of the sulci in the right frontoparietal region. Findings are suggestive for edema and suspect recent infarct or insult. There is no evidence for hemorrhage, midline shift or hydrocephalus. Vascular: No hyperdense  vessel or unexpected calcification. Skull: Normal. Negative for fracture or focal lesion. Sinuses/Orbits: Visualized paranasal sinuses are clear. Other: None IMPRESSION: Areas of low density in the right cerebral hemisphere are most compatible with cytotoxic edema and suspect a recent infarct. No evidence for acute hemorrhage. These intracranial finding could be better characterized with MRI. These results were called by telephone at the time of interpretation on  02/15/2021 at 4:04 pm to provider DAVID [facility] , who verbally acknowledged these results. Electronically Signed   By: Markus Daft M.D.   On: 02/15/2021 16:08    My personal review of EKG: Rhythm A fib, Rate  78 /min, QTc 449 , no Acute ST changes   Assessment & Plan:    Active Problems:   Hypothyroidism   ATRIAL FIBRILLATION, PAROXYSMAL   Stroke ([place])   Acute CVA -Patient presents with left facial droop, slurred speech, visual deficits, slurred speech, CT brain significant for right cerebral hemispheres CVA. -She is admitted under ischemic extremity pathway, will obtain MRI brain, carotid Doppler, 2D echo, lipid panel, A1c, consult PT/OT/SLP. -She failed swallow evaluation, so she received suppository aspirin. -she is with known history of A. fib, not on anticoagulation, will defer anticoagulation to neurology -We will hold antihypertensive regimen and allow for permissive hypertension.  Atrial fibrillation -Patient has been on aspirin at home, but rate controlled, further recommendation from neurology when safe to start full anticoagulation. -He is on digoxin, this can be transitioned to IV if she becomes tachycardic -Resume pindolol able to take oral  Hypothyroidism - Resume Synthroid when she can swallow  Hypertension -We will hold Cozaar for now   DVT Prophylaxis Heparin  AM Labs Ordered, also please review Full Orders  Family Communication: Admission, patients condition and plan of care including tests being ordered  have been discussed with the patient and son who indicate understanding and agree with the plan and Code Status.  Code Status Full  Likely DC to  Pending  PT/SLP  Condition GUARDED    Consults called: tele neuro requested in ED    Admission status: inpatient   Time spent in minutes : 60 minutes   Phillips Climes M.D on 02/15/2021 at 7:49 PM   [facility] - Office  [phone]

## 2021-02-15 NOTE — ED Triage Notes (Signed)
Pt presents to ED via [facility] for left sided facial droop, left sided weakness, and slurred speech. LKW 5/24 before lunch

## 2021-02-15 NOTE — ED Provider Notes (Signed)
[facility] Provider Note   CSN: [id number] Arrival date & time: 02/15/21  1518     History Chief Complaint  Patient presents with  . Weakness    Debbie Rubio is a 80 y.o. female.  Patient presents to ED via EMS. Patient with left sided facial droop and left eye lid ptosis. Speech slightly slurred. Visual field deficit on left. MOE X 4, no weakness or drift appreciated. Patient reports she fell in the bathroom today, struck the right side of her head. Patient is on daily ASA, but no other blood thinners. Last seen well at lunchtime on Tuesday, 02/14/21.  The history is provided by the patient. No language interpreter was used.  Weakness Severity:  Moderate Onset quality:  Gradual Progression:  Unchanged Chronicity:  New Associated symptoms: falls and stroke symptoms   Associated symptoms: no abdominal pain, no loss of consciousness and no sensory-motor deficit        Past Medical History:  Diagnosis Date  . Anemia    YEARS AGO EARLY 20'S  . Arthritis    BILATERAL HANDS,KNEES  . Back spasm    LEFT LOWER BACK  . Biatrial enlargement   . Bronchitis    22 YEARS AGO, IF SHE GETS A COLD IT TURNS INTO BRONCHITIS - USES INHALER  . Dyspnea    with exercise  . GERD (gastroesophageal reflux disease)    NOT RECENTLY-NO MEDS  . H/O: hypothyroidism   . Hypertension   . Hypothyroidism   . Paroxysmal atrial fibrillation (HCC)   . Pneumonia 11/2015   only time every had pneumonia  . SVD (spontaneous vaginal delivery)    x 3  . Thyroid disease   . Urinary tract infection   . Wears partial dentures    upper    Patient Active Problem List   Diagnosis Date Noted  . Abnormal uterine bleeding (AUB) 02/04/2018  . Influenza B 12/02/2015  . Febrile respiratory illness 12/02/2015  . Elevated troponin 12/02/2015  . AKI (acute kidney injury) ([place]) 12/02/2015  . Hypokalemia 12/02/2015  . CAP (community acquired pneumonia)   . TINNITUS, CHRONIC 08/20/2008   . Hypothyroidism 08/04/2008  . ATRIAL FIBRILLATION, PAROXYSMAL 08/04/2008    Past Surgical History:  Procedure Laterality Date  . DILATION AND CURETTAGE OF UTERUS    . EYE SURGERY     BURNED BILATERAL TEAR DUCTS  . HYSTEROSCOPY WITH D & C N/A 03/03/2018   Procedure: DILATATION AND CURETTAGE /HYSTEROSCOPY;  Surgeon: Molli Posey, MD;  Location: [facility];  Service: Gynecology;  Laterality: N/A;  patient was originally on 6/3, but was sick  . MULTIPLE TOOTH EXTRACTIONS     upper teeth     OB History   No obstetric history on file.     Family History  Problem Relation Age of Onset  . Heart attack Father   . Heart disease Mother     Social History   Tobacco Use  . Smoking status: Former Smoker    Packs/day: 0.75    Years: 28.00    Pack years: 21.00    Types: Cigarettes    Quit date: 1988    Years since quitting: 34.4  . Smokeless tobacco: Never Used  Vaping Use  . Vaping Use: Never used  Substance Use Topics  . Alcohol use: No  . Drug use: No    Home Medications Prior to Admission medications   Medication Sig Start Date End Date Taking? Authorizing Provider  albuterol (PROVENTIL HFA;VENTOLIN HFA) 108 (  90 Base) MCG/ACT inhaler Inhale 1-2 puffs into the lungs every 6 (six) hours as needed for wheezing or shortness of breath.    [provider]  aspirin 81 MG chewable tablet Chew 81 mg by mouth daily.    [provider]  digoxin (LANOXIN) 0.25 MG tablet Take 250 mcg by mouth daily.    [provider]  HYDROcodone-acetaminophen (NORCO/VICODIN) 5-325 MG tablet Take 1 tablet by mouth every 6 (six) hours as needed. Patient not taking: Reported on 02/14/2018 01/26/18   Frederica Kuster, PA-C  levothyroxine (SYNTHROID, LEVOTHROID) 125 MCG tablet Take 125 mcg by mouth daily before breakfast.    [provider]  losartan (COZAAR) 100 MG tablet Take 100 mg by mouth daily.    [provider]  pindolol (VISKEN) 5 MG tablet Take 2.5 mg by  mouth daily. One half tablet daily    [provider]  traMADol (ULTRAM) 50 MG tablet Take 25 mg by mouth every 6 (six) hours as needed.    [provider]    Allergies    Beta adrenergic blockers, Flecainide, Levaquin [levofloxacin], Metoprolol succinate, Prednisone, and Pseudoephedrine  Review of Systems   Review of Systems  Gastrointestinal: Negative for abdominal pain.  Musculoskeletal: Positive for falls.  Neurological: Positive for speech difficulty and weakness. Negative for loss of consciousness.  All other systems reviewed and are negative.   Physical Exam Updated Vital Signs BP (!) 159/67 (BP Location: Right Arm)   Pulse 86   Temp 98.8 F (37.1 C) (Oral)   Resp 18   Ht 5' 5.5" (1.664 m)   Wt 77.1 kg   SpO2 94%   BMI 27.86 kg/m   Physical Exam Vitals and nursing note reviewed.  Constitutional:      Appearance: Normal appearance.  HENT:     Head: Normocephalic.     Nose: Nose normal.     Mouth/Throat:     Mouth: Mucous membranes are moist.  Eyes:     General: Visual field deficit present.     Conjunctiva/sclera: Conjunctivae normal.  Cardiovascular:     Rate and Rhythm: Normal rate.  Pulmonary:     Effort: Pulmonary effort is normal.  Abdominal:     General: There is no distension.     Palpations: Abdomen is soft.  Musculoskeletal:        General: Normal range of motion.     Cervical back: Neck supple.  Skin:    General: Skin is warm and dry.  Neurological:     Mental Status: She is alert.     GCS: GCS eye subscore is 4. GCS verbal subscore is 5. GCS motor subscore is 6.     Cranial Nerves: Dysarthria and facial asymmetry present.     Sensory: Sensation is intact. No sensory deficit.     Motor: Motor function is intact. No pronator drift.     ED Results / Procedures / Treatments   Labs (all labs ordered are listed, but only abnormal results are displayed) Labs Reviewed  CBC - Abnormal; Notable for the following components:       Result Value   WBC 12.3 (*)    All other components within normal limits  DIFFERENTIAL - Abnormal; Notable for the following components:   Neutro Abs 9.2 (*)    All other components within normal limits  COMPREHENSIVE METABOLIC PANEL - Abnormal; Notable for the following components:   Glucose, Bld 112 (*)    BUN 26 (*)  Total Protein 8.2 (*)    Total Bilirubin 2.3 (*)    All other components within normal limits  RESP PANEL BY RT-PCR (FLU A&B, COVID) ARPGX2  PROTIME-INR  APTT  ETHANOL  RAPID URINE DRUG SCREEN, HOSP PERFORMED  URINALYSIS, ROUTINE W REFLEX MICROSCOPIC  I-STAT CHEM 8, ED    EKG EKG Interpretation  Date/Time:  Wednesday Feb 15 2021 16:15:18 EDT Ventricular Rate:  78 PR Interval:    QRS Duration: 94 QT Interval:  394 QTC Calculation: 449 R Axis:   49 Text Interpretation: Atrial fibrillation Abnormal ECG No significant change since last tracing Confirmed by Dorie Rank [phone]) on 02/15/2021 4:24:44 PM   Radiology CT HEAD WO CONTRAST  Result Date: 02/15/2021 CLINICAL DATA:  81 year old with left facial droop. Left-sided weakness. EXAM: CT HEAD WITHOUT CONTRAST TECHNIQUE: Contiguous axial images were obtained from the base of the skull through the vertex without intravenous contrast. COMPARISON:  None. FINDINGS: Brain: Areas of low-density in the right temporoparietal region. Areas of low-density in the right frontal-parietal region. Mild effacement of the sulci in the right frontoparietal region. Findings are suggestive for edema and suspect recent infarct or insult. There is no evidence for hemorrhage, midline shift or hydrocephalus. Vascular: No hyperdense vessel or unexpected calcification. Skull: Normal. Negative for fracture or focal lesion. Sinuses/Orbits: Visualized paranasal sinuses are clear. Other: None IMPRESSION: Areas of low density in the right cerebral hemisphere are most compatible with cytotoxic edema and suspect a recent infarct. No evidence for  acute hemorrhage. These intracranial finding could be better characterized with MRI. These results were called by telephone at the time of interpretation on 02/15/2021 at 4:04 pm to provider Mekala Winger [facility] , who verbally acknowledged these results. Electronically Signed   By: Markus Daft M.D.   On: 02/15/2021 16:08    Procedures Procedures   Medications Ordered in ED Medications - No data to display  ED Course  I have reviewed the triage vital signs and the nursing notes.  Pertinent labs & imaging results that were available during my care of the patient were reviewed by me and considered in my medical decision making (see chart for details).    MDM Rules/Calculators/A&P                          CT findings consistent with recent infarct. Outside the window for tpa. Patient with history of atrial fib, is not on anticoagulant. On daily aspirin. Patient discussed with attending, Dr. Tomi Bamberger. Discussed with hospitalist. Neurology consult pending. Patient did not pass stroke-swallow screen.   Final Clinical Impression(s) / ED Diagnoses Final diagnoses:  Cerebrovascular accident (CVA) due to other mechanism [facility])    Rx / [place] Orders ED Discharge Orders    None       Etta Quill, NP 02/15/21 2345    Dorie Rank, MD 02/18/21 1324

## 2021-02-16 ENCOUNTER — Inpatient Hospital Stay (HOSPITAL_COMMUNITY): Payer: PPO

## 2021-02-16 ENCOUNTER — Other Ambulatory Visit (HOSPITAL_COMMUNITY): Payer: Self-pay | Admitting: *Deleted

## 2021-02-16 DIAGNOSIS — I480 Paroxysmal atrial fibrillation: Secondary | ICD-10-CM

## 2021-02-16 DIAGNOSIS — E038 Other specified hypothyroidism: Secondary | ICD-10-CM | POA: Diagnosis not present

## 2021-02-16 DIAGNOSIS — E7800 Pure hypercholesterolemia, unspecified: Secondary | ICD-10-CM

## 2021-02-16 DIAGNOSIS — I639 Cerebral infarction, unspecified: Secondary | ICD-10-CM

## 2021-02-16 DIAGNOSIS — I6389 Other cerebral infarction: Secondary | ICD-10-CM

## 2021-02-16 DIAGNOSIS — I10 Essential (primary) hypertension: Secondary | ICD-10-CM

## 2021-02-16 LAB — COMPREHENSIVE METABOLIC PANEL
ALT: 25 U/L (ref 0–44)
AST: 35 U/L (ref 15–41)
Albumin: 3.7 g/dL (ref 3.5–5.0)
Alkaline Phosphatase: 72 U/L (ref 38–126)
Anion gap: 8 (ref 5–15)
BUN: 29 mg/dL — ABNORMAL HIGH (ref 8–23)
CO2: 25 mmol/L (ref 22–32)
Calcium: 8.7 mg/dL — ABNORMAL LOW (ref 8.9–10.3)
Chloride: 105 mmol/L (ref 98–111)
Creatinine, Ser: 0.79 mg/dL (ref 0.44–1.00)
GFR, Estimated: >60 mL/min (ref >60)
Glucose, Bld: 102 mg/dL — ABNORMAL HIGH (ref 70–99)
Potassium: 3.5 mmol/L (ref 3.5–5.1)
Sodium: 138 mmol/L (ref 135–145)
Total Bilirubin: 2.3 mg/dL — ABNORMAL HIGH (ref 0.3–1.2)
Total Protein: 7.6 g/dL (ref 6.5–8.1)

## 2021-02-16 LAB — CBC WITH DIFFERENTIAL/PLATELET
Abs Immature Granulocytes: 0.04 10*3/uL (ref 0.00–0.07)
Basophils Absolute: 0 10*3/uL (ref 0.0–0.1)
Basophils Relative: 0 %
Eosinophils Absolute: 0.1 10*3/uL (ref 0.0–0.5)
Eosinophils Relative: 1 %
HCT: 43.6 % (ref 36.0–46.0)
Hemoglobin: 13.4 g/dL (ref 12.0–15.0)
Immature Granulocytes: 0 %
Lymphocytes Relative: 22 %
Lymphs Abs: 2 10*3/uL (ref 0.7–4.0)
MCH: 27.1 pg (ref 26.0–34.0)
MCHC: 30.7 g/dL (ref 30.0–36.0)
MCV: 88.1 fL (ref 80.0–100.0)
Monocytes Absolute: 0.7 10*3/uL (ref 0.1–1.0)
Monocytes Relative: 7 %
Neutro Abs: 6.5 10*3/uL (ref 1.7–7.7)
Neutrophils Relative %: 70 %
Platelets: 244 10*3/uL (ref 150–400)
RBC: 4.95 MIL/uL (ref 3.87–5.11)
RDW: 14.1 % (ref 11.5–15.5)
WBC: 9.3 10*3/uL (ref 4.0–10.5)
nRBC: 0 % (ref 0.0–0.2)

## 2021-02-16 LAB — ECHOCARDIOGRAM COMPLETE
Area-P 1/2: 3.37 cm2
Height: 65.5 in
S' Lateral: 2.8 cm
Weight: 2694.9 oz

## 2021-02-16 LAB — LIPID PANEL
Cholesterol: 133 mg/dL (ref 0–200)
HDL: 37 mg/dL — ABNORMAL LOW (ref >40)
LDL Cholesterol: 80 mg/dL (ref 0–99)
Total CHOL/HDL Ratio: 3.6 RATIO
Triglycerides: 80 mg/dL (ref <150)
VLDL: 16 mg/dL (ref 0–40)

## 2021-02-16 LAB — PHOSPHORUS: Phosphorus: 3 mg/dL (ref 2.5–4.6)

## 2021-02-16 LAB — HEMOGLOBIN A1C
Hgb A1c MFr Bld: 6.4 % — ABNORMAL HIGH (ref 4.8–5.6)
Mean Plasma Glucose: 137 mg/dL

## 2021-02-16 LAB — MAGNESIUM: Magnesium: 2 mg/dL (ref 1.7–2.4)

## 2021-02-16 NOTE — Consult Note (Signed)
Debbie A. Merlene Laughter, MD     [email or web address]          Debbie Rubio is an 80 y.o. female.   ASSESSMENT/PLAN: 1. 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 Dual antiplatelet agents are recommended for 1 week. Given the atrial fibrillation however, the patient should be placed on long-term anticoagulation preferably Eliquis. Given the acute gait impairment, the patient will need physical therapy and possibly even occupational therapy. 2. Paroxysmal atrial fibrillation with the patient having no clear contraindications. Anticoagulation is recommended. Given her large acute stroke, I recommend that anticoagulation be initiated 1 week after the these current event to reduce the risk of hemorrhagic transformation.     This is a 80 year old right-handed white female who presents with the acute onset of gait instability. She a few times and was not able to get up without assistance. She denies any loss of consciousness. She does not clearly state that she had focal numbness or weakness. She denies any dizziness but was told that she may have had mild difficulty speaking / mild dysarthria. She does not report palpitation, chest pain or shortness of breath. She does have a history of paroxysmal atrial fibrillation. She apparently has had this for years but gives no reason why she would had not been on anticoagulation. She denies history of recurrent falling other than for the acute illness.She complains of significant pain and soreness of the left thoracic region, left abdominal region and left upper extremity. She reports the pain started after her her recent falls on yesterday. The review systems otherwise negative.     GENERAL:  This is a pleasant overweight female who is doing okay at this time. She does appear to be in  some pain however.  HEENT:  Neck is supple no trauma noted.  ABDOMEN: soft  EXTREMITIES: No edema   BACK: Normal  SKIN: Normal by inspection.    MENTAL STATUS: Alert and oriented including oriented to her name and the month. Speech, language and cognition are generally intact. Judgment and insight normal.   CRANIAL NERVES: Pupils are equal, round and reactive to light and accomodation; extra ocular movements are full, there is no significant nystagmus; visual fields are full; upper and lower facial muscles are normal in strength and symmetric, there is no flattening of the nasolabial folds; tongue is midline; uvula is midline; shoulder elevation is normal.  MOTOR:  There is mild left hemiparesis graded as 4/5 both in the upper lower extremities. There is mild drift of the left upper extremity and left leg. The right side shows normal tone, bulk concern.  COORDINATION: Left finger to nose is normal, right finger to nose is normal, No rest tremor; no intention tremor; no postural tremor; no bradykinesia.  REFLEXES: Deep tendon reflexes are symmetrical and normal.   SENSATION: Normal to light touch, temperature, and pain. She does extinguish to double simultaneous stimulation on the left.   NIH stroke scale 1, 1, 2 total 4.    Blood pressure (!) 152/76, pulse 72, temperature 98.6 F (37 C), temperature source Oral, resp. rate 19, height 5' 5.5" (1.664 m), weight 76.4 kg, SpO2 100 %.  Past Medical History:  Diagnosis Date  . Anemia    YEARS AGO EARLY 20'S  . Arthritis    BILATERAL HANDS,KNEES  . Back spasm    LEFT LOWER BACK  .  Biatrial enlargement   . Bronchitis    22 YEARS AGO, IF SHE GETS A COLD IT TURNS INTO BRONCHITIS - USES INHALER  . Dyspnea    with exercise  . GERD (gastroesophageal reflux disease)    NOT RECENTLY-NO MEDS  . H/O: hypothyroidism   . Hypertension   . Hypothyroidism   . Paroxysmal atrial fibrillation (HCC)   . Pneumonia 11/2015   only time every  had pneumonia  . SVD (spontaneous vaginal delivery)    x 3  . Thyroid disease   . Urinary tract infection   . Wears partial dentures    upper    Past Surgical History:  Procedure Laterality Date  . DILATION AND CURETTAGE OF UTERUS    . EYE SURGERY     BURNED BILATERAL TEAR DUCTS  . HYSTEROSCOPY WITH D & C N/A 03/03/2018   Procedure: DILATATION AND CURETTAGE /HYSTEROSCOPY;  Surgeon: Molli Posey, MD;  Location: [facility];  Service: Gynecology;  Laterality: N/A;  patient was originally on 6/3, but was sick  . MULTIPLE TOOTH EXTRACTIONS     upper teeth    Family History  Problem Relation Age of Onset  . Heart attack Father   . Heart disease Mother     Social History:  reports that she quit smoking about 34 years ago. Her smoking use included cigarettes. She has a 21.00 pack-year smoking history. She has never used smokeless tobacco. She reports that she does not drink alcohol and does not use drugs.  Allergies:  Allergies  Allergen Reactions  . Beta Adrenergic Blockers     REACTION: bradycardia and hypotension. Pt reports she is sensitive to the high doses, but she can take the lower doses.  . Flecainide Swelling  . Levaquin [Levofloxacin] Other (See Comments)    bradycardia  . Metoprolol Succinate     REACTION: severe hypotension  . Prednisone Other (See Comments)    Pt states she cannot take due to afib  . Pseudoephedrine     REACTION: causes tachycardia    Medications: Prior to Admission medications   Medication Sig Start Date End Date Taking? Authorizing Provider  albuterol (PROVENTIL HFA;VENTOLIN HFA) 108 (90 Base) MCG/ACT inhaler Inhale 1-2 puffs into the lungs every 6 (six) hours as needed for wheezing or shortness of breath.   Yes [provider]  aspirin 81 MG chewable tablet Chew 81 mg by mouth daily.   Yes [provider]  digoxin (LANOXIN) 0.25 MG tablet Take 250 mcg by mouth daily.   Yes [provider]  guaiFENesin (MUCINEX) 600  MG 12 hr tablet Take 600 mg by mouth 2 (two) times daily.   Yes [provider]  indapamide (LOZOL) 2.5 MG tablet Take 2.5 mg by mouth See admin instructions. Take 1/2 tablet daily 12/06/20  Yes [provider]  levothyroxine (SYNTHROID) 137 MCG tablet Take 137 mcg by mouth daily. 01/10/21  Yes [provider]  losartan (COZAAR) 100 MG tablet Take 100 mg by mouth daily.   Yes [provider]  meloxicam (MOBIC) 15 MG tablet Take 1 tablet by mouth daily. 01/20/21  Yes [provider]  pindolol (VISKEN) 5 MG tablet Take 2.5 mg by mouth daily. One half tablet daily   Yes [provider]  traMADol (ULTRAM) 50 MG tablet Take 25 mg by mouth every 6 (six) hours as needed.   Yes [provider]  HYDROcodone-acetaminophen (NORCO/VICODIN) 5-325 MG tablet Take 1 tablet by mouth every 6 (six) hours as needed.  Patient not taking: No sig reported 01/26/18   Frederica Kuster, PA-C  levothyroxine (SYNTHROID, LEVOTHROID) 125 MCG tablet Take 125 mcg by mouth daily before breakfast. Patient not taking: Reported on 02/15/2021    [provider]    Scheduled Meds: .  stroke: mapping our early stages of recovery book   Does not apply Once  . aspirin  300 mg Rectal Daily   Or  . aspirin  325 mg Oral Daily  . digoxin  250 mcg Oral Daily  . heparin  5,000 Units Subcutaneous Q8H  . levothyroxine  125 mcg Oral QAC breakfast   Continuous Infusions: . sodium chloride 75 mL/hr at 02/16/21 1300   PRN Meds:.acetaminophen **OR** acetaminophen (TYLENOL) oral liquid 160 mg/5 mL **OR** acetaminophen     Results for orders placed or performed during the hospital encounter of 02/15/21 (from the past 48 hour(s))  Ethanol     Status: None   Collection Time: 02/15/21  3:35 PM  Result Value Ref Range   Alcohol, Ethyl (B) <10 <10 mg/dL    Comment: (NOTE) Lowest detectable limit for serum alcohol is 10 mg/dL.  For medical purposes only. Performed at [facility], [address] [place], [place] [address]   Protime-INR     Status: None   Collection Time: 02/15/21  3:35 PM  Result Value Ref Range   Prothrombin Time 14.6 11.4 - 15.2 seconds   INR 1.1 0.8 - 1.2    Comment: (NOTE) INR goal varies based on device and disease states. Performed at [facility], [address] [place], [place] [address]   APTT     Status: None   Collection Time: 02/15/21  3:35 PM  Result Value Ref Range   aPTT 27 24 - 36 seconds    Comment: Performed at [facility], [address] [place], [place] [address]  CBC     Status: Abnormal   Collection Time: 02/15/21  3:35 PM  Result Value Ref Range   WBC 12.3 (H) 4.0 - 10.5 K/uL   RBC 5.08 3.87 - 5.11 MIL/uL   Hemoglobin 13.7 12.0 - 15.0 g/dL   HCT 43.8 36.0 - 46.0 %   MCV 86.2 80.0 - 100.0 fL   MCH 27.0 26.0 - 34.0 pg   MCHC 31.3 30.0 - 36.0 g/dL   RDW 14.2 11.5 - 15.5 %   Platelets 285 150 - 400 K/uL   nRBC 0.0 0.0 - 0.2 %    Comment: Performed at [facility], [address], [place], [place] [address]  Differential     Status: Abnormal   Collection Time: 02/15/21  3:35 PM  Result Value Ref Range   Neutrophils Relative % 75 %   Neutro Abs 9.2 (H) 1.7 - 7.7 K/uL   Lymphocytes Relative 17 %   Lymphs Abs 2.1 0.7 - 4.0 K/uL   Monocytes Relative 7 %   Monocytes Absolute 0.9 0.1 - 1.0 K/uL   Eosinophils Relative 0 %   Eosinophils Absolute 0.1 0.0 - 0.5 K/uL   Basophils Relative 0 %   Basophils Absolute 0.0 0.0 - 0.1 K/uL   Immature Granulocytes 1 %   Abs Immature Granulocytes 0.06 0.00 - 0.07 K/uL    Comment: Performed at [facility], [address] [place], [place] [address]  Comprehensive metabolic panel     Status: Abnormal   Collection Time: 02/15/21  3:35 PM  Result Value Ref Range   Sodium 137 135 - 145 mmol/L   Potassium  3.9 3.5 - 5.1 mmol/L   Chloride 102 98 - 111 mmol/L   CO2 26 22 - 32 mmol/L   Glucose, Bld 112 (H) 70 - 99 mg/dL    Comment: Glucose reference range applies only to samples  taken after fasting for at least 8 hours.   BUN 26 (H) 8 - 23 mg/dL   Creatinine, Ser 0.85 0.44 - 1.00 mg/dL   Calcium 9.2 8.9 - 10.3 mg/dL   Total Protein 8.2 (H) 6.5 - 8.1 g/dL   Albumin 3.9 3.5 - 5.0 g/dL   AST 32 15 - 41 U/L   ALT 23 0 - 44 U/L   Alkaline Phosphatase 76 38 - 126 U/L   Total Bilirubin 2.3 (H) 0.3 - 1.2 mg/dL   GFR, Estimated >60 >60 mL/min    Comment: (NOTE) Calculated using the CKD-EPI Creatinine Equation (2021)    Anion gap 9 5 - 15    Comment: Performed at [facility], [address], [place], [place] [address]  Urine rapid drug screen (hosp performed)     Status: None   Collection Time: 02/15/21  3:35 PM  Result Value Ref Range   Opiates NONE DETECTED NONE DETECTED   Cocaine NONE DETECTED NONE DETECTED   Benzodiazepines NONE DETECTED NONE DETECTED   Amphetamines NONE DETECTED NONE DETECTED   Tetrahydrocannabinol NONE DETECTED NONE DETECTED   Barbiturates NONE DETECTED NONE DETECTED    Comment: (NOTE) DRUG SCREEN FOR MEDICAL PURPOSES ONLY.  IF CONFIRMATION IS NEEDED FOR ANY PURPOSE, NOTIFY LAB WITHIN 5 DAYS.  LOWEST DETECTABLE LIMITS FOR URINE DRUG SCREEN Drug Class                     Cutoff (ng/mL) Amphetamine and metabolites    1000 Barbiturate and metabolites    200 Benzodiazepine                 413 Tricyclics and metabolites     300 Opiates and metabolites        300 Cocaine and metabolites        300 THC                            50 Performed at [facility] [place], [place] [address]   Urinalysis, Routine w reflex microscopic Urine, Clean Catch     Status: Abnormal   Collection Time: 02/15/21  3:35 PM  Result Value Ref Range   Color, Urine YELLOW YELLOW   APPearance HAZY (A) CLEAR   Specific Gravity, Urine 1.024 1.005 - 1.030   pH 5.0 5.0 - 8.0   Glucose, UA NEGATIVE NEGATIVE mg/dL   Hgb urine dipstick LARGE (A) NEGATIVE   Bilirubin Urine NEGATIVE NEGATIVE   Ketones, ur NEGATIVE NEGATIVE mg/dL   Protein, ur 30 (A)  NEGATIVE mg/dL   Nitrite NEGATIVE NEGATIVE   Leukocytes,Ua NEGATIVE NEGATIVE   RBC / HPF >50 (H) 0 - 5 RBC/hpf   WBC, UA 0-5 0 - 5 WBC/hpf   Bacteria, UA NONE SEEN NONE SEEN   Squamous Epithelial / LPF 0-5 0 - 5   Mucus PRESENT    Hyaline Casts, UA PRESENT     Comment: Performed at [facility], [address] [place],  [address]  Resp Panel by RT-PCR (Flu A&B, Covid) Nasopharyngeal Swab     Status: None   Collection Time: 02/15/21  5:00 PM   Specimen: Nasopharyngeal Swab; Nasopharyngeal(NP) swabs in  vial transport medium  Result Value Ref Range   SARS Coronavirus 2 by RT PCR NEGATIVE NEGATIVE    Comment: (NOTE) SARS-CoV-2 target nucleic acids are NOT DETECTED.  The SARS-CoV-2 RNA is generally detectable in upper respiratory specimens during the acute phase of infection. The lowest concentration of SARS-CoV-2 viral copies this assay can detect is 138 copies/mL. A negative result does not preclude SARS-Cov-2 infection and should not be used as the sole basis for treatment or other patient management decisions. A negative result may occur with  improper specimen collection/handling, submission of specimen other than nasopharyngeal swab, presence of viral mutation(s) within the areas targeted by this assay, and inadequate number of viral copies(<138 copies/mL). A negative result must be combined with clinical observations, patient history, and epidemiological information. The expected result is Negative.  Fact Sheet for Patients:  EntrepreneurPulse.com.au  Fact Sheet for Healthcare Providers:  IncredibleEmployment.be  This test is no t yet approved or cleared by the [place] FDA and  has been authorized for detection and/or diagnosis of SARS-CoV-2 by FDA under an Emergency Use Authorization (EUA). This EUA will remain  in effect (meaning this test can be used) for the duration of the COVID-19 declaration under Section 564(b)(1) of  the Act, 21 U.S.C.section 360bbb-3(b)(1), unless the authorization is terminated  or revoked sooner.       Influenza A by PCR NEGATIVE NEGATIVE   Influenza B by PCR NEGATIVE NEGATIVE    Comment: (NOTE) The Xpert Xpress SARS-CoV-2/FLU/RSV plus assay is intended as an aid in the diagnosis of influenza from Nasopharyngeal swab specimens and should not be used as a sole basis for treatment. Nasal washings and aspirates are unacceptable for Xpert Xpress SARS-CoV-2/FLU/RSV testing.  Fact Sheet for Patients: EntrepreneurPulse.com.au  Fact Sheet for Healthcare Providers: IncredibleEmployment.be  This test is not yet approved or cleared by the [place] FDA and has been authorized for detection and/or diagnosis of SARS-CoV-2 by FDA under an Emergency Use Authorization (EUA). This EUA will remain in effect (meaning this test can be used) for the duration of the COVID-19 declaration under Section 564(b)(1) of the Act, 21 U.S.C. section 360bbb-3(b)(1), unless the authorization is terminated or revoked.  Performed at [facility], [address] [place], [place] [address]   Hemoglobin A1c     Status: Abnormal   Collection Time: 02/16/21  5:00 AM  Result Value Ref Range   Hgb A1c MFr Bld 6.4 (H) 4.8 - 5.6 %    Comment: (NOTE)         Prediabetes: 5.7 - 6.4         Diabetes: >6.4         Glycemic control for adults with diabetes: <7.0    Mean Plasma Glucose 137 mg/dL    Comment: (NOTE) Performed At: [facility] [place], [place] [id number] Rush Farmer MD [id number]   Lipid panel     Status: Abnormal   Collection Time: 02/16/21  5:00 AM  Result Value Ref Range   Cholesterol 133 0 - 200 mg/dL   Triglycerides 80 <150 mg/dL   HDL 37 (L) >40 mg/dL   Total CHOL/HDL Ratio 3.6 RATIO   VLDL 16 0 - 40 mg/dL   LDL Cholesterol 80 0 - 99 mg/dL    Comment:        Total Cholesterol/HDL:CHD Risk Coronary Heart Disease Risk  Table  Men   Women  1/2 Average Risk   3.4   3.3  Average Risk       5.0   4.4  2 X Average Risk   9.6   7.1  3 X Average Risk  23.4   11.0        Use the calculated Patient Ratio above and the CHD Risk Table to determine the patient's CHD Risk.        ATP III CLASSIFICATION (LDL):  <100     mg/dL   Optimal  100-129  mg/dL   Near or Above                    Optimal  130-159  mg/dL   Borderline  160-189  mg/dL   High  >190     mg/dL   Very High Performed at [place] [place], [place] [address]   Comprehensive metabolic panel     Status: Abnormal   Collection Time: 02/16/21  9:08 AM  Result Value Ref Range   Sodium 138 135 - 145 mmol/L   Potassium 3.5 3.5 - 5.1 mmol/L   Chloride 105 98 - 111 mmol/L   CO2 25 22 - 32 mmol/L   Glucose, Bld 102 (H) 70 - 99 mg/dL    Comment: Glucose reference range applies only to samples taken after fasting for at least 8 hours.   BUN 29 (H) 8 - 23 mg/dL   Creatinine, Ser 0.79 0.44 - 1.00 mg/dL   Calcium 8.7 (L) 8.9 - 10.3 mg/dL   Total Protein 7.6 6.5 - 8.1 g/dL   Albumin 3.7 3.5 - 5.0 g/dL   AST 35 15 - 41 U/L   ALT 25 0 - 44 U/L   Alkaline Phosphatase 72 38 - 126 U/L   Total Bilirubin 2.3 (H) 0.3 - 1.2 mg/dL   GFR, Estimated >60 >60 mL/min    Comment: (NOTE) Calculated using the CKD-EPI Creatinine Equation (2021)    Anion gap 8 5 - 15    Comment: Performed at [facility], [address] [place], [place] [address]  Magnesium     Status: None   Collection Time: 02/16/21  9:08 AM  Result Value Ref Range   Magnesium 2.0 1.7 - 2.4 mg/dL    Comment: Performed at [facility], [address], [place], [place] [address]  Phosphorus     Status: None   Collection Time: 02/16/21  9:08 AM  Result Value Ref Range   Phosphorus 3.0 2.5 - 4.6 mg/dL    Comment: Performed at [facility], [address], [place], [place] [address]  CBC with Differential/Platelet     Status: None   Collection Time: 02/16/21  9:08  AM  Result Value Ref Range   WBC 9.3 4.0 - 10.5 K/uL   RBC 4.95 3.87 - 5.11 MIL/uL   Hemoglobin 13.4 12.0 - 15.0 g/dL   HCT 43.6 36.0 - 46.0 %   MCV 88.1 80.0 - 100.0 fL   MCH 27.1 26.0 - 34.0 pg   MCHC 30.7 30.0 - 36.0 g/dL   RDW 14.1 11.5 - 15.5 %   Platelets 244 150 - 400 K/uL   nRBC 0.0 0.0 - 0.2 %   Neutrophils Relative % 70 %   Neutro Abs 6.5 1.7 - 7.7 K/uL   Lymphocytes Relative 22 %   Lymphs Abs 2.0 0.7 - 4.0 K/uL   Monocytes Relative 7 %   Monocytes Absolute 0.7 0.1 -  1.0 K/uL   Eosinophils Relative 1 %   Eosinophils Absolute 0.1 0.0 - 0.5 K/uL   Basophils Relative 0 %   Basophils Absolute 0.0 0.0 - 0.1 K/uL   Immature Granulocytes 0 %   Abs Immature Granulocytes 0.04 0.00 - 0.07 K/uL    Comment: Performed at [facility], [address], [place],  [address]    Studies/Results:  TTE 1. Left ventricular ejection fraction, by estimation, is 55 to 60%. The  left ventricle has normal function. The left ventricle has no regional  wall motion abnormalities. Left ventricular diastolic parameters are  indeterminate.  2. Right ventricular systolic function is normal. The right ventricular  size is normal. There is normal pulmonary artery systolic pressure. The  estimated right ventricular systolic pressure is 60.4 mmHg.  3. Left atrial size was mildly dilated.  4. Right atrial size was severely dilated.  5. The mitral valve is grossly normal. Mild mitral valve regurgitation.  6. The aortic valve is tricuspid. Aortic valve regurgitation is not  visualized.  7. The inferior vena cava is normal in size with greater than 50%  respiratory variability, suggesting right atrial pressure of 3 mmHg.      CAROTID  IMPRESSION: Bilateral carotid atherosclerosis. No hemodynamically significant ICA stenosis. Degree of narrowing less than 50% bilaterally by ultrasound criteria.  Patent antegrade vertebral flow bilaterally     FINDINGS: MRI HEAD  FINDINGS  Brain: Areas of subcortical/cortical restricted diffusion present in the right frontal, parietal, and temporal lobes reflecting acute MCA infarcts. There is some associate susceptibility in the superior right temporal lobe reflecting petechial hemorrhage. No significant mass effect.  Other patchy foci of T2 hyperintensity in the supratentorial white matter are nonspecific but probably reflect mild chronic microvascular ischemic changes ventricles and sulci are within normal limits in size and configuration. No intracranial mass. There is no hydrocephalus or extra-axial fluid collection.  Vascular: Major vessel flow voids at the skull base are preserved.  Skull and upper cervical spine: Normal marrow signal is preserved.  Sinuses/Orbits: Paranasal sinuses are aerated. Orbits are unremarkable.  Other: Sella is unremarkable.  Mastoid air cells are clear.  MRA HEAD FINDINGS  Anterior circulation: Intracranial internal carotid arteries are patent. Anterior cerebral arteries are patent. Anterior communicating artery is present. Left MCA is patent. Right M1 MCA is patent. There is short segment high-grade stenosis of the origin of the posterior division. There is partial occlusion or high-grade stenosis at the origin of the anterior division with diminished flow related enhancement of the M2 portion.  Posterior circulation: Intracranial vertebral arteries are partially imaged but patent. Basilar artery is patent. Posterior cerebral arteries are patent.  IMPRESSION: Several small to moderate size acute right MCA territory infarcts. Minimal petechial hemorrhage in the temporal lobe.  Short segment high-grade stenosis of right MCA posterior division origin. Partial occlusion or high-grade stenosis of right MCA anterior division origin with diminished flow within M2 portion.       Brain MRI and MRA are reviewed in person. There is acute infarcts involving  the right frontal temporal parietal areas.  There is a high-grade stenosis involving the distal M1 segment of the right MCA.  Mairen Wallenstein A. Merlene Rubio, M.D.  Diplomate, Tax adviser of Psychiatry and Neurology ( Neurology). 02/16/2021, 7:27 PM

## 2021-02-16 NOTE — Plan of Care (Signed)
  Problem: Acute Rehab OT Goals (only OT should resolve) Goal: Pt. Will Perform Grooming Flowsheets (Taken 02/16/2021 0957) Pt Will Perform Grooming:  Independently  standing Goal: Pt. Will Transfer To Toilet [place] (Taken 02/16/2021 0957) Pt Will Transfer to Toilet:  Independently  ambulating  stand pivot transfer Goal: Pt/Caregiver Will Perform Home Exercise Program Flowsheets (Taken 02/16/2021 0957) Pt/caregiver will Perform Home Exercise Program:  Increased strength  Both right and left upper extremity  Independently   Betsie Peckman OT, MOT

## 2021-02-16 NOTE — Progress Notes (Signed)
*  PRELIMINARY RESULTS* Echocardiogram 2D Echocardiogram has been performed.  Samuel Germany 02/16/2021, 1:48 PM

## 2021-02-16 NOTE — Progress Notes (Signed)
SLP Cancellation Note  Patient Details Name: Debbie Rubio MRN: [id number] DOB: 1940-12-06   Cancelled treatment:       Reason Eval/Treat Not Completed: Other (comment) (Pt working with PT, SLP will check back later for BSE/SLE)   Thank you,  Genene Churn, [place]    [place] 02/16/2021, 8:41 AM

## 2021-02-16 NOTE — Evaluation (Signed)
Occupational Therapy Evaluation Patient Details Name: Debbie Rubio MRN: [id number] DOB: 12/24/1940 Today's Date: 02/16/2021    History of Present Illness Debbie Rubio  is a 80 y.o. female, past medical history of atrial fibrillation, only on aspirin, hypothyroidism, hypertension, she presents to ED with left-sided facial droop, left eyelid ptosis, slurred speech, and visual deficits on the left, reports she fell in the bathroom today, struck the right side of her head, she is herself unaware of these deficits, she thinks she woke up at her usual health, reports she fell in the bathroom in the morning unsure 7 or 9 AM, reports she was not able to stand up, family tried to reach her, when they were unable, son went to see her, found her on the floor in the afternoon, where she was brought by EMS, she denies fever, chills, chest pain, nausea, vomiting or diarrhea.  - in ED CT head significant for acute CVA, areas of low-density in the right cerebral hemisphere compatible with cytotoxic edema and recent infarct, no evidence of acute hemorrhage, [facility]  hospitalist consulted to admit.   Clinical Impression   Pt agreeable to OT/PT co-evaluation this date. Pt demonstrates mild balance deficits during functional ambulation without DME. Pt able to don socks at EOB with Mod I level of assist. Pt demonstrated visual deficits in the form of poor convergence and possible L side neglect via difficulty tracking an object in L field with emphasis on the superior quadrant. Pt requires SPV assist when out of bed due to mild balance deficits and possible vision deficits. Pt will benefit from continued OT services in the hospital and recommended venue below to increase balance, visual perception and coordination, and endurance for safe ADL's.     Follow Up Recommendations  Home health OT;Supervision - Intermittent    Equipment Recommendations  None recommended by OT           Precautions / Restrictions  Precautions Precautions: Fall Restrictions Weight Bearing Restrictions: No      Mobility Bed Mobility Overal bed mobility: Modified Independent                  Transfers Overall transfer level: Needs assistance   Transfers: Sit to/from Stand;Stand Pivot Transfers Sit to Stand: Supervision Stand pivot transfers: Supervision       General transfer comment: without use of cane or RW; EOB to ambulation in hall to seated in w/c.    Balance Overall balance assessment: Needs assistance Sitting-balance support: Feet supported Sitting balance-Leahy Scale: Good Sitting balance - Comments: EOB   Standing balance support: No upper extremity supported;During functional activity Standing balance-Leahy Scale: Good (fair to good) Standing balance comment: without RW during ambulation and transfers.                           ADL either performed or assessed with clinical judgement   ADL Overall ADL's : Needs assistance/impaired                     Lower Body Dressing: Modified independent;Sitting/lateral leans Lower Body Dressing Details (indicate cue type and reason): donning socks seated at EOB with mild extended time. Toilet Transfer: Architectural technologist Details (indicate cue type and reason): simulated via ambulatory transfer from EOB to chair.                 Vision Baseline Vision/History: Wears glasses Wears Glasses: Reading only Patient Visual Report: No change  from baseline (Pt denied change in vision within the last few days.) Vision Assessment?: Yes Eye Alignment: Within Functional Limits Tracking/Visual Pursuits: Decreased smoothness of eye movement to LEFT superior field;Other (comment) (Possible neglect in L flied with emphasis on superior field.) Convergence: Impaired (comment) (Pt unable to converge on object brought to nose from anterior position.) Visual Fields: Left visual field deficit;Other (comment)  (Possible Left visual field deficit with emphasis on superior quadrant.)                Pertinent Vitals/Pain Pain Assessment: Faces Faces Pain Scale: Hurts a little bit Pain Location: L hip Pain Descriptors / Indicators: Sore Pain Intervention(s): Limited activity within patient's tolerance;Monitored during session;Repositioned     Hand Dominance Left   Extremity/Trunk Assessment Upper Extremity Assessment Upper Extremity Assessment: Generalized weakness (4/5 B shoulder strength; 4+/5 bilateral elbow flex and ext; 4/5 bilateral grip strength.)   Lower Extremity Assessment Lower Extremity Assessment: Defer to PT evaluation   Cervical / Trunk Assessment Cervical / Trunk Assessment: Normal   Communication Communication Communication: Expressive difficulties (slurred speech)   Cognition Arousal/Alertness: Awake/alert Behavior During Therapy: WFL for tasks assessed/performed Overall Cognitive Status: Within Functional Limits for tasks assessed                                                      Home Living Family/patient expects to be discharged to:: Private residence Living Arrangements: Alone Available Help at Discharge: Available PRN/intermittently;Family;Friend(s) Type of Home: House Home Access: Stairs to enter [employer] of Steps: 7 to 8 Entrance Stairs-Rails: Can reach both;Right;Left Home Layout: One level     Bathroom Shower/Tub: Occupational psychologist: Handicapped height     Home Equipment: Grab bars - tub/shower          Prior Functioning/Environment Level of Independence: Independent        Comments: Pt dove and was indepdnent for ADL's and IADL's        OT Problem List: Impaired balance (sitting and/or standing);Impaired vision/perception      OT Treatment/Interventions: Self-care/ADL training;Therapeutic exercise;Therapeutic activities;Visual/perceptual remediation/compensation;Balance  training;Patient/family education    OT Goals(Current goals can be found in the care plan section) Acute Rehab OT Goals Patient Stated Goal: return home OT Goal Formulation: With patient/family Time For Goal Achievement: 03/02/21 Potential to Achieve Goals: Good  OT Frequency: Min 1X/week               Co-evaluation PT/OT/SLP Co-Evaluation/Treatment: Yes Reason for Co-Treatment: To address functional/ADL transfers   OT goals addressed during session: Strengthening/ROM;ADL's and self-care                       End of Session    Activity Tolerance: Patient tolerated treatment well Patient left: in chair;with chair alarm set;with family/visitor present  OT Visit Diagnosis: Unsteadiness on feet (R26.81);Repeated falls (R29.6);Other abnormalities of gait and mobility (R26.89);Other symptoms and signs involving the nervous system (R29.898)                Time: 3419-6222 OT Time Calculation (min): 22 min Charges:  OT General Charges $OT Visit: 1 Visit OT Evaluation $OT Eval Low Complexity: 1 Low  Santresa Levett OT, MOT   Larey Seat 02/16/2021, 9:55 AM

## 2021-02-16 NOTE — Progress Notes (Signed)
Pt to unit via stretcher at this time, pt able to move to bed with minimal assist at this time. Pt is AxOx4, mild slurred speech, able to follow commands. Pt states her vision has improved since admission, no longer blurry. Pt wears upper denture. Pt has clothing with her at bedside and shoes. Soiled underwear in bag from ED. Purewick placed on patient and sacrum patch at this time. Pt has noted bruising to left hip, ice bag placed on hip. Pt oriented to room and how to call for help at this time. Weight taken, VSS,  charted.

## 2021-02-16 NOTE — Plan of Care (Signed)
  Problem: Acute Rehab PT Goals(only PT should resolve) Goal: Pt Will Go Supine/Side To Sit Outcome: Progressing Flowsheets (Taken 02/16/2021 1233) Pt will go Supine/Side to Sit: with modified independence Goal: Patient Will Transfer Sit To/From Stand Outcome: Progressing Flowsheets (Taken 02/16/2021 1233) Patient will transfer sit to/from stand: with modified independence Goal: Pt Will Transfer Bed To Chair/Chair To Bed Outcome: Progressing Flowsheets (Taken 02/16/2021 1233) Pt will Transfer Bed to Chair/Chair to Bed: with modified independence Goal: Pt Will Ambulate Outcome: Progressing Flowsheets (Taken 02/16/2021 1233) Pt will Ambulate:  > 125 feet  with modified independence  with least restrictive assistive device   12:34 PM, 02/16/21 Lonell Grandchild, MPT Physical Therapist with [facility] [phone] office [phone] mobile phone

## 2021-02-16 NOTE — Evaluation (Signed)
Clinical/Bedside Swallow Evaluation Patient Details  Name: Debbie Rubio MRN: [id number] Date of Birth: 1941/03/26  Today's Date: 02/16/2021 Time: SLP Start Time (ACUTE ONLY): 1335 SLP Stop Time (ACUTE ONLY): 1402 SLP Time Calculation (min) (ACUTE ONLY): 27.1 min  Past Medical History:  Past Medical History:  Diagnosis Date  . Anemia    YEARS AGO EARLY 20'S  . Arthritis    BILATERAL HANDS,KNEES  . Back spasm    LEFT LOWER BACK  . Biatrial enlargement   . Bronchitis    22 YEARS AGO, IF SHE GETS A COLD IT TURNS INTO BRONCHITIS - USES INHALER  . Dyspnea    with exercise  . GERD (gastroesophageal reflux disease)    NOT RECENTLY-NO MEDS  . H/O: hypothyroidism   . Hypertension   . Hypothyroidism   . Paroxysmal atrial fibrillation (HCC)   . Pneumonia 11/2015   only time every had pneumonia  . SVD (spontaneous vaginal delivery)    x 3  . Thyroid disease   . Urinary tract infection   . Wears partial dentures    upper   Past Surgical History:  Past Surgical History:  Procedure Laterality Date  . DILATION AND CURETTAGE OF UTERUS    . EYE SURGERY     BURNED BILATERAL TEAR DUCTS  . HYSTEROSCOPY WITH D & C N/A 03/03/2018   Procedure: DILATATION AND CURETTAGE /HYSTEROSCOPY;  Surgeon: Molli Posey, MD;  Location: [facility];  Service: Gynecology;  Laterality: N/A;  patient was originally on 6/3, but was sick  . MULTIPLE TOOTH EXTRACTIONS     upper teeth   HPI:  Debbie Rubio  is a 80 y.o. female, past medical history of atrial fibrillation, only on aspirin, hypothyroidism, hypertension, she presents to ED with left-sided facial droop, left eyelid ptosis, slurred speech, and visual deficits on the left, reports she fell in the bathroom today, struck the right side of her head, she is herself unaware of these deficits, she thinks she woke up at her usual health, reports she fell in the bathroom in the morning unsure 7 or 9 AM, reports she was not able to stand up, family tried to  reach her, when they were unable, son went to see her, found her on the floor in the afternoon, where she was brought by EMS, she denies fever, chills, chest pain, nausea, vomiting or diarrhea.  - in ED CT head significant for acute CVA, areas of low-density in the right cerebral hemisphere compatible with cytotoxic edema and recent infarct, no evidence of acute hemorrhage, [facility]  hospitalist consulted to admit. MRI shows Several small to moderate size acute right MCA territory infarcts.  Minimal petechial hemorrhage in the temporal lobe. Pt failed the [place]. BSE/SLE ordered.   Assessment / Plan / Recommendation Clinical Impression  Clinical swallow evaluation completed at bedside. Pt presents with left facial asymmetry and weakness. Vocal quality is mildly harsh, however Pt indicates this is her baseline. Pt assessed with ice chips, thin water via cup/straw, puree, and regular textures. Pt with prolonged oral prep and min left buccal pocketing and lingual coating. She required verbal cues to refrain from talking while eating/drinking. No overt signs of aspiration, however Pt is at risk due to acute CVA with left sided weakness. Recommend D3/mech soft with thin liquids via small sips, slow rate, avoid talking with po, and ensure oral clearance. PO medications whole with water. Pt also with mild dysarthria, but otherwise alert and oriented. SLE ordered, however unable to complete this date  due to scheduling restraints. Recommend HH SLP if Pt discharges soon for dysphagia and dysarthria. SLP will follow during acute stay. Above to RN and MD.   SLP Visit Diagnosis: Dysphagia, unspecified (R13.10)    Aspiration Risk  Mild aspiration risk    Diet Recommendation Dysphagia 3 (Mech soft);Thin liquid   Liquid Administration via: Cup;Straw Medication Administration: Whole meds with liquid Supervision: Patient able to self feed;Intermittent supervision to cue for compensatory strategies Compensations: Slow  rate;Small sips/bites;Lingual sweep for clearance of pocketing Postural Changes: Seated upright at 90 degrees;Remain upright for at least 30 minutes after po intake    Other  Recommendations Oral Care Recommendations: Oral care BID;Staff/trained caregiver to provide oral care Other Recommendations: Clarify dietary restrictions   Follow up Recommendations Home health SLP      Frequency and Duration min 2x/week  1 week       Prognosis Prognosis for Safe Diet Advancement: Good      Swallow Study   General Date of Onset: 02/15/21 HPI: [name]  is a 80 y.o. female, past medical history of atrial fibrillation, only on aspirin, hypothyroidism, hypertension, she presents to ED with left-sided facial droop, left eyelid ptosis, slurred speech, and visual deficits on the left, reports she fell in the bathroom today, struck the right side of her head, she is herself unaware of these deficits, she thinks she woke up at her usual health, reports she fell in the bathroom in the morning unsure 7 or 9 AM, reports she was not able to stand up, family tried to reach her, when they were unable, son went to see her, found her on the floor in the afternoon, where she was brought by EMS, she denies fever, chills, chest pain, nausea, vomiting or diarrhea.  - in ED CT head significant for acute CVA, areas of low-density in the right cerebral hemisphere compatible with cytotoxic edema and recent infarct, no evidence of acute hemorrhage, [facility]  hospitalist consulted to admit. MRI shows Several small to moderate size acute right MCA territory infarcts.  Minimal petechial hemorrhage in the temporal lobe. Pt failed the [place]. BSE/SLE ordered. Type of Study: Bedside Swallow Evaluation Previous Swallow Assessment: N/A Diet Prior to this Study: NPO Temperature Spikes Noted: No Respiratory Status: Room air History of Recent Intubation: No Behavior/Cognition: Alert;Cooperative Oral Cavity Assessment: Within  Functional Limits Oral Care Completed by SLP: Yes Oral Cavity - Dentition: Dentures, top;Dentures, bottom Vision: Functional for self-feeding Self-Feeding Abilities: Able to feed self Patient Positioning: Upright in bed Baseline Vocal Quality: Normal (mild harsh quality) Volitional Cough: Strong Volitional Swallow: Able to elicit    Oral/Motor/Sensory Function Overall Oral Motor/Sensory Function: Moderate impairment Facial ROM: Reduced left;Suspected CN VII (facial) dysfunction Facial Symmetry: Abnormal symmetry left;Suspected CN VII (facial) dysfunction Facial Strength: Reduced left;Suspected CN VII (facial) dysfunction Lingual ROM: Reduced left;Suspected CN XII (hypoglossal) dysfunction Lingual Symmetry: Abnormal symmetry left;Suspected CN XII (hypoglossal) dysfunction Lingual Strength: Reduced;Suspected CN XII (hypoglossal) dysfunction Lingual Sensation: Reduced Velum: Within Functional Limits Mandible: Within Functional Limits   Ice Chips Ice chips: Within functional limits Presentation: Spoon   Thin Liquid Thin Liquid: Within functional limits Presentation: Cup;Straw;Self Fed    Nectar Thick Nectar Thick Liquid: Not tested   Honey Thick Honey Thick Liquid: Not tested   Puree Puree: Within functional limits Presentation: Self Fed;Spoon Oral Phase Impairments: Reduced lingual movement/coordination   Solid     Solid: Impaired Presentation: Self Fed Oral Phase Impairments: Reduced lingual movement/coordination;Impaired mastication Oral Phase Functional Implications: Prolonged oral transit;Oral  residue     Thank you,  Genene Churn, [place]  Lashaye Fisk 02/16/2021,2:02 PM

## 2021-02-16 NOTE — Progress Notes (Signed)
PROGRESS NOTE    Debbie Rubio  FYB:[id number] DOB: 1941-06-26 DOA: 02/15/2021 PCP: Caryl Bis, MD     Brief Narrative:  Debbie Rubio  is a 80 y.o. WF PMHx Atrial fibrillation, only on aspirin, HTN, hypothyroidism, hypertension, she presents to ED with left-sided facial droop, left eyelid ptosis, slurred speech, and visual deficits on the left, reports she fell in the bathroom today, struck the right side of her head, she is herself unaware of these deficits, she thinks she woke up at her usual health, reports she fell in the bathroom in the morning unsure 7 or 9 AM, reports she was not able to stand up, family tried to reach her, when they were unable, son went to see her, found her on the floor in the afternoon, where she was brought by EMS, she denies fever, chills, chest pain, nausea, vomiting or diarrhea. - in ED CT head significant for acute CVA, areas of low-density in the right cerebral hemisphere compatible with cytotoxic edema and recent infarct, no evidence of acute hemorrhage, [facility]  hospitalist consulted to admit.  Subjective: Afebrile overnight, A/O x4, positive mild aphasia.   Assessment & Plan: Covid vaccination; vaccinated 3/3   Active Problems:   Hypothyroidism   ATRIAL FIBRILLATION, PAROXYSMAL   Stroke ([place])  Acute CVA -Patient with mostly LEFT facial droop and mild slurred speech.  Other neurodeficits not present. -5/26 Hemoglobin A1c pending  HLD - 5/26 LDL= 80.  LDL goal<70 - 5/26  Lipitor 40 mg daily if patient tolerates titrate up to 80 mg daily  Dysphagia 5/26 patient passed swallow evaluation now on dysphagia 3 diet thin liquid  Atrial fibrillation - 5/26 currently NSR - Not on anticoagulant. - 5/26 Dr Merlene Laughter neurology team (has been consulted), should see patient this evening will defer to neurology appropriate anticoagulation -Digoxin 250 mcg daily  Essential HTN - Allow permissive HTN.  Hold all BP medication unless  SBP>180  Hypothyroidism - Synthroid 125 heartburn daily  Obese (BMI 27.6 kg/m) -   DVT prophylaxis: Heparin Code Status: Full Family Communication:  Status is: Inpatient    Dispo: The patient is from: Home              Anticipated d/c is to: Home with home health              Anticipated d/c date is: 5/28              Patient currently unstable      Consultants:  Neurology   Procedures/Significant Events:  5/25 CT head W0 contrast:Areas of low density in the right cerebral hemisphere are most compatible with cytotoxic edema and suspect a recent infarct.  -No evidence for acute hemorrhage.  5/25 MRI brain/MRA head:-Several small to moderate size acute right MCA territory infarcts. -Minimal petechial hemorrhage in the temporal lobe. -Short segment high-grade stenosis of right MCA posterior division origin. -Partial occlusion or high-grade stenosis of right MCA anterior division origin with diminished flow within M2 portion.   I have personally reviewed and interpreted all radiology studies and my findings are as above.  VENTILATOR SETTINGS:    Cultures   Antimicrobials:    Devices    LINES / TUBES:      Continuous Infusions: . sodium chloride 75 mL/hr at 02/16/21 0316     Objective: Vitals:   02/16/21 0036 02/16/21 0153 02/16/21 0359 02/16/21 0559  BP:  (!) 178/64 (!) 121/102 (!) 158/79  Pulse:  81 79 76  Resp:  19 20 20   Temp:  98 F (36.7 C) 97.7 F (36.5 C) 98.1 F (36.7 C)  TempSrc:   Oral Oral  SpO2:  98% 100% 100%  Weight: 76.4 kg     Height:        Intake/Output Summary (Last 24 hours) at 02/16/2021 0759 Last data filed at 02/16/2021 7591 Gross per 24 hour  Intake 277.04 ml  Output 100 ml  Net 177.04 ml   Filed Weights   02/15/21 1523 02/16/21 0036  Weight: 77.1 kg 76.4 kg    Examination:  General: A/O x4, No acute respiratory distress Eyes: negative scleral hemorrhage, negative anisocoria, negative icterus ENT:  Negative Runny nose, negative gingival bleeding, Neck:  Negative scars, masses, torticollis, lymphadenopathy, JVD Lungs: Clear to auscultation bilaterally without wheezes or crackles Cardiovascular: Regular rate and rhythm without murmur gallop or rub normal S1 and S2 Abdomen: negative abdominal pain, nondistended, positive soft, bowel sounds, no rebound, no ascites, no appreciable mass Extremities: No significant cyanosis, clubbing, or edema bilateral lower extremities Skin: Negative rashes, lesions, ulcers Psychiatric:  Negative depression, negative anxiety, negative fatigue, negative mania  Central nervous system:  Cranial nerves II through XII intact, tongue deviated slightly to the left/, all extremities muscle strength 5/5, sensation intact throughout, finger nose finger bilateral within normal limits, quick finger touch bilateral within normal limits,  heel to shin bilateral within normal limits, positive dysarthria, negative expressive aphasia, negative receptive aphasia.  Positive left facial droop  .     Data Reviewed: Care during the described time interval was provided by me .  I have reviewed this patient's available data, including medical history, events of note, physical examination, and all test results as part of my evaluation.  CBC: Recent Labs  Lab 02/15/21 1535  WBC 12.3*  NEUTROABS 9.2*  HGB 13.7  HCT 43.8  MCV 86.2  PLT 638   Basic Metabolic Panel: Recent Labs  Lab 02/15/21 1535  NA 137  K 3.9  CL 102  CO2 26  GLUCOSE 112*  BUN 26*  CREATININE 0.85  CALCIUM 9.2   GFR: Estimated Creatinine Clearance: 56.4 mL/min (by C-G formula based on SCr of 0.85 mg/dL). Liver Function Tests: Recent Labs  Lab 02/15/21 1535  AST 32  ALT 23  ALKPHOS 76  BILITOT 2.3*  PROT 8.2*  ALBUMIN 3.9   No results for input(s): LIPASE, AMYLASE in the last 168 hours. No results for input(s): AMMONIA in the last 168 hours. Coagulation Profile: Recent Labs  Lab  02/15/21 1535  INR 1.1   Cardiac Enzymes: No results for input(s): CKTOTAL, CKMB, CKMBINDEX, TROPONINI in the last 168 hours. BNP (last 3 results) No results for input(s): PROBNP in the last 8760 hours. HbA1C: No results for input(s): HGBA1C in the last 72 hours. CBG: No results for input(s): GLUCAP in the last 168 hours. Lipid Profile: Recent Labs    02/16/21 0500  CHOL 133  HDL 37*  LDLCALC 80  TRIG 80  CHOLHDL 3.6   Thyroid Function Tests: No results for input(s): TSH, T4TOTAL, FREET4, T3FREE, THYROIDAB in the last 72 hours. Anemia Panel: No results for input(s): VITAMINB12, FOLATE, FERRITIN, TIBC, IRON, RETICCTPCT in the last 72 hours. Sepsis Labs: No results for input(s): PROCALCITON, LATICACIDVEN in the last 168 hours.  Recent Results (from the past 240 hour(s))  Resp Panel by RT-PCR (Flu A&B, Covid) Nasopharyngeal Swab     Status: None   Collection Time: 02/15/21  5:00 PM   Specimen: Nasopharyngeal Swab; Nasopharyngeal(NP)  swabs in vial transport medium  Result Value Ref Range Status   SARS Coronavirus 2 by RT PCR NEGATIVE NEGATIVE Final    Comment: (NOTE) SARS-CoV-2 target nucleic acids are NOT DETECTED.  The SARS-CoV-2 RNA is generally detectable in upper respiratory specimens during the acute phase of infection. The lowest concentration of SARS-CoV-2 viral copies this assay can detect is 138 copies/mL. A negative result does not preclude SARS-Cov-2 infection and should not be used as the sole basis for treatment or other patient management decisions. A negative result may occur with  improper specimen collection/handling, submission of specimen other than nasopharyngeal swab, presence of viral mutation(s) within the areas targeted by this assay, and inadequate number of viral copies(<138 copies/mL). A negative result must be combined with clinical observations, patient history, and epidemiological information. The expected result is Negative.  Fact Sheet  for Patients:  EntrepreneurPulse.com.au  Fact Sheet for Healthcare Providers:  IncredibleEmployment.be  This test is no t yet approved or cleared by the [place] FDA and  has been authorized for detection and/or diagnosis of SARS-CoV-2 by FDA under an Emergency Use Authorization (EUA). This EUA will remain  in effect (meaning this test can be used) for the duration of the COVID-19 declaration under Section 564(b)(1) of the Act, 21 U.S.C.section 360bbb-3(b)(1), unless the authorization is terminated  or revoked sooner.       Influenza A by PCR NEGATIVE NEGATIVE Final   Influenza B by PCR NEGATIVE NEGATIVE Final    Comment: (NOTE) The Xpert Xpress SARS-CoV-2/FLU/RSV plus assay is intended as an aid in the diagnosis of influenza from Nasopharyngeal swab specimens and should not be used as a sole basis for treatment. Nasal washings and aspirates are unacceptable for Xpert Xpress SARS-CoV-2/FLU/RSV testing.  Fact Sheet for Patients: EntrepreneurPulse.com.au  Fact Sheet for Healthcare Providers: IncredibleEmployment.be  This test is not yet approved or cleared by the [place] FDA and has been authorized for detection and/or diagnosis of SARS-CoV-2 by FDA under an Emergency Use Authorization (EUA). This EUA will remain in effect (meaning this test can be used) for the duration of the COVID-19 declaration under Section 564(b)(1) of the Act, 21 U.S.C. section 360bbb-3(b)(1), unless the authorization is terminated or revoked.  Performed at [facility], [address], [place], [place] [address]          Radiology Studies: CT HEAD WO CONTRAST  Result Date: 02/15/2021 CLINICAL DATA:  80 year old with left facial droop. Left-sided weakness. EXAM: CT HEAD WITHOUT CONTRAST TECHNIQUE: Contiguous axial images were obtained from the base of the skull through the vertex without intravenous contrast.  COMPARISON:  None. FINDINGS: Brain: Areas of low-density in the right temporoparietal region. Areas of low-density in the right frontal-parietal region. Mild effacement of the sulci in the right frontoparietal region. Findings are suggestive for edema and suspect recent infarct or insult. There is no evidence for hemorrhage, midline shift or hydrocephalus. Vascular: No hyperdense vessel or unexpected calcification. Skull: Normal. Negative for fracture or focal lesion. Sinuses/Orbits: Visualized paranasal sinuses are clear. Other: None IMPRESSION: Areas of low density in the right cerebral hemisphere are most compatible with cytotoxic edema and suspect a recent infarct. No evidence for acute hemorrhage. These intracranial finding could be better characterized with MRI. These results were called by telephone at the time of interpretation on 02/15/2021 at 4:04 pm to provider DAVID [facility] , who verbally acknowledged these results. Electronically Signed   By: Markus Daft M.D.   On: 02/15/2021 16:08  Scheduled Meds: .  stroke: mapping our early stages of recovery book   Does not apply Once  . aspirin  300 mg Rectal Daily   Or  . aspirin  325 mg Oral Daily  . digoxin  250 mcg Oral Daily  . heparin  5,000 Units Subcutaneous Q8H  . levothyroxine  125 mcg Oral QAC breakfast   Continuous Infusions: . sodium chloride 75 mL/hr at 02/16/21 0316     LOS: 1 day    Time spent:40 min    Theotis Gerdeman, Geraldo Docker, MD [facility]   If 7PM-7AM, please contact night-coverage 02/16/2021, 7:59 AM

## 2021-02-16 NOTE — Evaluation (Signed)
Physical Therapy Evaluation Patient Details Name: Debbie Rubio MRN: [id number] DOB: November 30, 1940 Today's Date: 02/16/2021   History of Present Illness  Debbie Rubio  is a 80 y.o. female, past medical history of atrial fibrillation, only on aspirin, hypothyroidism, hypertension, she presents to ED with left-sided facial droop, left eyelid ptosis, slurred speech, and visual deficits on the left, reports she fell in the bathroom today, struck the right side of her head, she is herself unaware of these deficits, she thinks she woke up at her usual health, reports she fell in the bathroom in the morning unsure 7 or 9 AM, reports she was not able to stand up, family tried to reach her, when they were unable, son went to see her, found her on the floor in the afternoon, where she was brought by EMS, she denies fever, chills, chest pain, nausea, vomiting or diarrhea.  - in ED CT head significant for acute CVA, areas of low-density in the right cerebral hemisphere compatible with cytotoxic edema and recent infarct, no evidence of acute hemorrhage, [facility]  hospitalist consulted to admit.    Clinical Impression  Patient demonstrates slow slightly labored movement for functional mobility and gait, able to ambulate in hallway without loss of balance without use of an AD, tends to look to the right without bumping into nearby objects, able to go up/down steps in stairwell with verbal cues for safety and use of 1 side rail.  Patient tolerated sitting up in chair after therapy with her son in room.  Patient will benefit from continued physical therapy in hospital and recommended venue below to increase strength, balance, endurance for safe ADLs and gait.     Follow Up Recommendations Home health PT;Supervision for mobility/OOB;Supervision - Intermittent    Equipment Recommendations  None recommended by PT    Recommendations for Other Services       Precautions / Restrictions Precautions Precautions:  Fall Restrictions Weight Bearing Restrictions: No      Mobility  Bed Mobility Overal bed mobility: Modified Independent             General bed mobility comments: slightly increased time    Transfers Overall transfer level: Needs assistance Equipment used: None Transfers: Sit to/from Stand;Stand Pivot Transfers Sit to Stand: Supervision Stand pivot transfers: Supervision       General transfer comment: increased time, labored movement  Ambulation/Gait Ambulation/Gait assistance: Min guard Gait Distance (Feet): 80 Feet Assistive device: None Gait Pattern/deviations: Decreased step length - left;Decreased stance time - right;Decreased stride length Gait velocity: decreased   General Gait Details: slow slightly labored cadence without loss of balance, tendency to look to her right most of time, but no bumping into nearby objects  Stairs Stairs: Yes Stairs assistance: Supervision Stair Management: Step to pattern;One rail Right;One rail Left Number of Stairs: 4 General stair comments: slow labored movement without loss of balance going up/down stairs using 1 siderail  Wheelchair Mobility    Modified Rankin (Stroke Patients Only)       Balance Overall balance assessment: Needs assistance Sitting-balance support: Feet supported;No upper extremity supported Sitting balance-Leahy Scale: Good Sitting balance - Comments: EOB   Standing balance support: During functional activity;No upper extremity supported Standing balance-Leahy Scale: Fair Standing balance comment: fair/good without AD, increased time                             Pertinent Vitals/Pain Pain Assessment: Faces Faces Pain Scale: Hurts a  little bit Pain Location: L hip Pain Descriptors / Indicators: Sore Pain Intervention(s): Limited activity within patient's tolerance;Monitored during session;Repositioned    Home Living Family/patient expects to be discharged to:: Private  residence Living Arrangements: Alone Available Help at Discharge: Available PRN/intermittently;Family;Friend(s) Type of Home: House Home Access: Stairs to enter Entrance Stairs-Rails: Can reach both;Right;Left Entrance Stairs-Number of Steps: 7 to 8 Home Layout: One level Home Equipment: Grab bars - tub/shower      Prior Function Level of Independence: Independent         Comments: Hydrographic surveyor, does her own shopping and driving     Hand Dominance   Dominant Hand: Left    Extremity/Trunk Assessment   Upper Extremity Assessment Upper Extremity Assessment: Defer to OT evaluation    Lower Extremity Assessment Lower Extremity Assessment: Generalized weakness    Cervical / Trunk Assessment Cervical / Trunk Assessment: Normal  Communication   Communication: Expressive difficulties  Cognition Arousal/Alertness: Awake/alert Behavior During Therapy: WFL for tasks assessed/performed Overall Cognitive Status: Within Functional Limits for tasks assessed                                        General Comments      Exercises     Assessment/Plan    PT Assessment Patient needs continued PT services  PT Problem List Decreased strength;Decreased activity tolerance;Decreased balance;Decreased mobility       PT Treatment Interventions DME instruction;Gait training;Stair training;Functional mobility training;Therapeutic activities;Therapeutic exercise;Patient/family education;Balance training    PT Goals (Current goals can be found in the Care Plan section)  Acute Rehab PT Goals Patient Stated Goal: return home with family to assist PT Goal Formulation: With patient/family Time For Goal Achievement: 02/18/21 Potential to Achieve Goals: Good    Frequency Min 4X/week   Barriers to discharge        Co-evaluation PT/OT/SLP Co-Evaluation/Treatment: Yes Reason for Co-Treatment: To address functional/ADL transfers;Complexity of the patient's  impairments (multi-system involvement) PT goals addressed during session: Mobility/safety with mobility;Balance OT goals addressed during session: Strengthening/ROM;ADL's and self-care       AM-PAC PT "6 Clicks" Mobility  Outcome Measure Help needed turning from your back to your side while in a flat bed without using bedrails?: None Help needed moving from lying on your back to sitting on the side of a flat bed without using bedrails?: None Help needed moving to and from a bed to a chair (including a wheelchair)?: A Little Help needed standing up from a chair using your arms (e.g., wheelchair or bedside chair)?: None Help needed to walk in hospital room?: A Little Help needed climbing 3-5 steps with a railing? : A Little 6 Click Score: 21    End of Session   Activity Tolerance: Patient tolerated treatment well;Patient limited by fatigue Patient left: in chair;with call bell/phone within reach;with family/visitor present Nurse Communication: Mobility status PT Visit Diagnosis: Unsteadiness on feet (R26.81);Other abnormalities of gait and mobility (R26.89);Muscle weakness (generalized) (M62.81)    Time: 4496-7591 PT Time Calculation (min) (ACUTE ONLY): 32 min   Charges:   PT Evaluation $PT Eval Moderate Complexity: 1 Mod PT Treatments $Therapeutic Activity: 23-37 mins        12:32 PM, 02/16/21 Lonell Grandchild, MPT Physical Therapist with [facility] [phone] office [phone] mobile phone

## 2021-02-17 DIAGNOSIS — I639 Cerebral infarction, unspecified: Secondary | ICD-10-CM | POA: Diagnosis not present

## 2021-02-17 DIAGNOSIS — I480 Paroxysmal atrial fibrillation: Secondary | ICD-10-CM | POA: Diagnosis not present

## 2021-02-17 DIAGNOSIS — E038 Other specified hypothyroidism: Secondary | ICD-10-CM | POA: Diagnosis not present

## 2021-02-17 LAB — CBC WITH DIFFERENTIAL/PLATELET
Abs Immature Granulocytes: 0.03 10*3/uL (ref 0.00–0.07)
Basophils Absolute: 0 10*3/uL (ref 0.0–0.1)
Basophils Relative: 0 %
Eosinophils Absolute: 0.1 10*3/uL (ref 0.0–0.5)
Eosinophils Relative: 2 %
HCT: 36.4 % (ref 36.0–46.0)
Hemoglobin: 11.2 g/dL — ABNORMAL LOW (ref 12.0–15.0)
Immature Granulocytes: 0 %
Lymphocytes Relative: 27 %
Lymphs Abs: 1.9 10*3/uL (ref 0.7–4.0)
MCH: 27 pg (ref 26.0–34.0)
MCHC: 30.8 g/dL (ref 30.0–36.0)
MCV: 87.7 fL (ref 80.0–100.0)
Monocytes Absolute: 0.6 10*3/uL (ref 0.1–1.0)
Monocytes Relative: 9 %
Neutro Abs: 4.4 10*3/uL (ref 1.7–7.7)
Neutrophils Relative %: 62 %
Platelets: 216 10*3/uL (ref 150–400)
RBC: 4.15 MIL/uL (ref 3.87–5.11)
RDW: 14 % (ref 11.5–15.5)
WBC: 7.1 10*3/uL (ref 4.0–10.5)
nRBC: 0 % (ref 0.0–0.2)

## 2021-02-17 LAB — COMPREHENSIVE METABOLIC PANEL
ALT: 20 U/L (ref 0–44)
AST: 29 U/L (ref 15–41)
Albumin: 3 g/dL — ABNORMAL LOW (ref 3.5–5.0)
Alkaline Phosphatase: 56 U/L (ref 38–126)
Anion gap: 4 — ABNORMAL LOW (ref 5–15)
BUN: 23 mg/dL (ref 8–23)
CO2: 27 mmol/L (ref 22–32)
Calcium: 8.2 mg/dL — ABNORMAL LOW (ref 8.9–10.3)
Chloride: 108 mmol/L (ref 98–111)
Creatinine, Ser: 0.74 mg/dL (ref 0.44–1.00)
GFR, Estimated: >60 mL/min (ref >60)
Glucose, Bld: 90 mg/dL (ref 70–99)
Potassium: 3.7 mmol/L (ref 3.5–5.1)
Sodium: 139 mmol/L (ref 135–145)
Total Bilirubin: 1.7 mg/dL — ABNORMAL HIGH (ref 0.3–1.2)
Total Protein: 6.3 g/dL — ABNORMAL LOW (ref 6.5–8.1)

## 2021-02-17 LAB — HEMOGLOBIN A1C
Hgb A1c MFr Bld: 6.2 % — ABNORMAL HIGH (ref 4.8–5.6)
Mean Plasma Glucose: 131 mg/dL

## 2021-02-17 LAB — PHOSPHORUS: Phosphorus: 2.6 mg/dL (ref 2.5–4.6)

## 2021-02-17 LAB — MAGNESIUM: Magnesium: 2 mg/dL (ref 1.7–2.4)

## 2021-02-17 MED ORDER — PANTOPRAZOLE SODIUM 40 MG PO TBEC: 40.0000 mg | DELAYED_RELEASE_TABLET | Freq: Every day | ORAL | 0 refills | Status: AC

## 2021-02-17 MED ORDER — CLOPIDOGREL BISULFATE 75 MG PO TABS: 75.0000 mg | ORAL_TABLET | Freq: Every day | ORAL | 0 refills | Status: AC

## 2021-02-17 MED ORDER — CLOPIDOGREL BISULFATE 75 MG PO TABS
75.0000 mg | ORAL_TABLET | Freq: Every day | ORAL | Status: DC
  Administered 2021-02-17: 75 mg via ORAL
  Filled 2021-02-17: qty 1

## 2021-02-17 MED ORDER — APIXABAN 5 MG PO TABS: 5.0000 mg | ORAL_TABLET | Freq: Two times a day (BID) | ORAL | Status: DC

## 2021-02-17 MED ORDER — ATORVASTATIN CALCIUM 20 MG PO TABS: 20.0000 mg | ORAL_TABLET | Freq: Every evening | ORAL | Status: DC

## 2021-02-17 MED ORDER — ASPIRIN 325 MG PO TABS: 325.0000 mg | ORAL_TABLET | Freq: Every day | ORAL | 0 refills | Status: AC

## 2021-02-17 MED ORDER — ATORVASTATIN CALCIUM 20 MG PO TABS: 20.0000 mg | ORAL_TABLET | Freq: Every evening | ORAL | 1 refills | Status: DC

## 2021-02-17 MED ORDER — APIXABAN 5 MG PO TABS: 5.0000 mg | ORAL_TABLET | Freq: Two times a day (BID) | ORAL | 1 refills | Status: AC

## 2021-02-17 NOTE — Progress Notes (Signed)
Physical Therapy Treatment Patient Details Name: Debbie Rubio MRN: [id number] DOB: June 28, 1941 Today's Date: 02/17/2021    History of Present Illness Debbie Rubio  is a 80 y.o. female, past medical history of atrial fibrillation, only on aspirin, hypothyroidism, hypertension, she presents to ED with left-sided facial droop, left eyelid ptosis, slurred speech, and visual deficits on the left, reports she fell in the bathroom today, struck the right side of her head, she is herself unaware of these deficits, she thinks she woke up at her usual health, reports she fell in the bathroom in the morning unsure 7 or 9 AM, reports she was not able to stand up, family tried to reach her, when they were unable, son went to see her, found her on the floor in the afternoon, where she was brought by EMS, she denies fever, chills, chest pain, nausea, vomiting or diarrhea.  - in ED CT head significant for acute CVA, areas of low-density in the right cerebral hemisphere compatible with cytotoxic edema and recent infarct, no evidence of acute hemorrhage, [facility]  hospitalist consulted to admit.    PT Comments    Patient seated EOB with son present at beginning of session. Discussed d/c plan with patient and son and they are agreeable to HHPT as they will be having someone stay with patient upon discharge. They are also agreeable to SNF if needed. Patient completes seated exercises with some cueing provided to continue performing exercise despite given cueing for number of reps. Patient able to transfer without assist or AD. She ambulates without AD and is given min G assist for safety/balance without loss of balance. Patient able to ambulate increased distance today. Patient will benefit from continued physical therapy in hospital and recommended venue below to increase strength, balance, endurance for safe ADLs and gait.      Follow Up Recommendations  Home health PT;Supervision for mobility/OOB;Supervision -  Intermittent     Equipment Recommendations  None recommended by PT    Recommendations for Other Services       Precautions / Restrictions Precautions Precautions: Fall Restrictions Weight Bearing Restrictions: No    Mobility  Bed Mobility Overal bed mobility: Modified Independent             General bed mobility comments: seated EOB at beginning of session    Transfers Overall transfer level: Needs assistance Equipment used: None Transfers: Sit to/from [employer] Transfers Sit to Stand: Supervision Stand pivot transfers: Supervision       General transfer comment: increased time, labored movement without AD  Ambulation/Gait Ambulation/Gait assistance: Min guard Gait Distance (Feet): 100 Feet Assistive device: None Gait Pattern/deviations: Decreased step length - left;Decreased stance time - right;Decreased stride length Gait velocity: decreased   General Gait Details: slow slightly labored cadence without loss of balance or use of AD   Stairs             Wheelchair Mobility    Modified Rankin (Stroke Patients Only)       Balance Overall balance assessment: Needs assistance Sitting-balance support: Feet supported;No upper extremity supported Sitting balance-Leahy Scale: Good Sitting balance - Comments: EOB   Standing balance support: During functional activity;No upper extremity supported Standing balance-Leahy Scale: Fair Standing balance comment: fair/good without AD, increased time                            Cognition Arousal/Alertness: Awake/alert Behavior During Therapy: WFL for tasks assessed/performed Overall Cognitive Status:  Within Functional Limits for tasks assessed                                        Exercises General Exercises - Lower Extremity Long Arc Quad: AROM;Both;10 reps;Seated Hip Flexion/Marching: AROM;Both;10 reps;Seated Toe Raises: AROM;Both;10 reps;Seated Heel Raises:  AROM;Both;10 reps;Seated    General Comments        Pertinent Vitals/Pain Pain Assessment: No/denies pain    Home Living                      Prior Function            PT Goals (current goals can now be found in the care plan section) Acute Rehab PT Goals Patient Stated Goal: return home with family to assist PT Goal Formulation: With patient/family Time For Goal Achievement: 02/18/21 Potential to Achieve Goals: Good Progress towards PT goals: Progressing toward goals    Frequency    Min 4X/week      PT Plan Current plan remains appropriate    Co-evaluation              AM-PAC PT "6 Clicks" Mobility   Outcome Measure  Help needed turning from your back to your side while in a flat bed without using bedrails?: None Help needed moving from lying on your back to sitting on the side of a flat bed without using bedrails?: None Help needed moving to and from a bed to a chair (including a wheelchair)?: A Little Help needed standing up from a chair using your arms (e.g., wheelchair or bedside chair)?: None Help needed to walk in hospital room?: A Little Help needed climbing 3-5 steps with a railing? : A Little 6 Click Score: 21    End of Session Equipment Utilized During Treatment: Gait belt Activity Tolerance: Patient tolerated treatment well;Patient limited by fatigue Patient left: in chair;with call bell/phone within reach;with family/visitor present Nurse Communication: Mobility status PT Visit Diagnosis: Unsteadiness on feet (R26.81);Other abnormalities of gait and mobility (R26.89);Muscle weakness (generalized) (M62.81)     Time: 4166-0630 PT Time Calculation (min) (ACUTE ONLY): 21 min  Charges:  $Therapeutic Activity: 8-22 mins                     10:48 AM, 02/17/21 Mearl Latin PT, DPT Physical Therapist at [facility]

## 2021-02-17 NOTE — Progress Notes (Signed)
AP [address] [facility]) Hospital Liaison note:  This is a pending outpatient-based Palliative Care patient. Will continue to follow for disposition.  Please call with any outpatient palliative questions or concerns.  Thank you, Lorelee Market, LPN [facility] Liaison [phone]

## 2021-02-17 NOTE — Discharge Summary (Addendum)
Physician Discharge Summary  Debbie Rubio ZOX:[id number] DOB: [birth year] DOA: 02/15/2021  PCP: Caryl Bis, MD Neurologist: Dr. Norton Blizzard  Admit date: 02/15/2021 Discharge date: 02/17/2021  Admitted From:  Home  Disposition: Home with Home Health   Recommendations for Outpatient Follow-up:  1. Follow up with PCP in 1-2 weeks 2. Ambulatory referral to palliative care  3. Please follow up with neurologist in 4 weeks 4. DAPT x 7 days total thru 6/2, then starting 6/3 apixaban 5 mg BID and stop DAPT  Pay careful attention to the dates:  Take aspirin and plavix for 5 more days thru 6/2 then stop  Start taking apixaban twice daily on 6/3   Home Health:  PT   Discharge Condition: STABLE    CODE STATUS: FULL   DIET: DYSPHAGIA 3 diet, thin liquid, heart Healthy    Brief Hospitalization Summary: Please see all hospital notes, images, labs for full details of the hospitalization. ADMISSION HPI:  Debbie Rubio  is a 80 y.o. female, past medical history of atrial fibrillation, only on aspirin, hypothyroidism, hypertension, she presents to ED with left-sided facial droop, left eyelid ptosis, slurred speech, and visual deficits on the left, reports she fell in the bathroom today, struck the right side of her head, she is herself unaware of these deficits, she thinks she woke up at her usual health, reports she fell in the bathroom in the morning unsure 7 or 9 AM, reports she was not able to stand up, family tried to reach her, when they were unable, son went to see her, found her on the floor in the afternoon, where she was brought by EMS, she denies fever, chills, chest pain, nausea, vomiting or diarrhea. - in ED CT head significant for acute CVA, areas of low-density in the right cerebral hemisphere compatible with cytotoxic edema and recent infarct, no evidence of acute hemorrhage, [facility]  hospitalist consulted to admit.  HOSPITAL COURSE   Assessment & Plan: Covid vaccination;  vaccinated 3/3   Active Problems:   Hypothyroidism   ATRIAL FIBRILLATION, PAROXYSMAL   Stroke ([place])  Acute CVA -Patient with mostly LEFT facial droop and mild slurred speech.  Other neurodeficits not present. -5/26 Hemoglobin A1c 6/4% Neurologist has seen patient and recommended that patient do DAPT x 7 days total thru 6/2 then start apixaban on 6/3 5 mg BID.  Clear instructions written and verbal given in multiple places for these instructions.  Bleeding precautions worksheet given and other stroke information given in written instructions.  Pt recommending home health PT which is ordered.  Follow up in 1 month outpatient with neurologist.  Follow up with PCP in 1-2 weeks for hospital follow up.    Neurologist Dr. Freddie Apley Recommendation Note ASSESSMENT/PLAN: 1. 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 Dual antiplatelet agents are recommended for 1 week. Given the atrial fibrillation however, the patient should be placed on long-term anticoagulation preferably Eliquis. Given the acute gait impairment, the patient will need physical therapy and possibly even occupational therapy. 2. Paroxysmal atrial fibrillation with the patient having no clear contraindications. Anticoagulation is recommended. Given her large acute stroke, I recommend that anticoagulation be initiated 1 week after the these current event to reduce the risk of hemorrhagic transformation.  HLD - 5/26 LDL= 80.  LDL goal<70 - 5/26  Lipitor 20 mg daily, further titration outpatient as needed  Dysphagia 5/26  patient passed swallow evaluation now on dysphagia 3 diet thin liquid  Paroxysmal Atrial fibrillation - 5/26 currently NSR rate controlled - Not on anticoagulant prior to admission - Pt is to start apixaban on 02/24/21 1 week after stroke to reduce likelihood of  hemorrhagic transformation  -Digoxin 250 mcg daily  Essential HTN -resuming home blood pressure medication as below  Hypothyroidism - Synthroid 125 heartburn daily  Obese (BMI 27.6 kg/m)  DVT prophylaxis: Heparin Code Status: Full Status is: Inpatient    Dispo: The patient is from: Home  Anticipated d/c is to: Home with home health  Anticipated d/c date is: 5/28  Patient currently unstable  Discharge Diagnoses:  Active Problems:   Hypothyroidism   ATRIAL FIBRILLATION, PAROXYSMAL   Stroke [facility])   Discharge Instructions: Discharge Instructions    Amb Referral to Palliative Care   Complete by: As directed      Allergies as of 02/17/2021      Reactions   Beta Adrenergic Blockers    REACTION: bradycardia and hypotension. Pt reports she is sensitive to the high doses, but she can take the lower doses.   Flecainide Swelling   Levaquin [levofloxacin] Other (See Comments)   bradycardia   Metoprolol Succinate    REACTION: severe hypotension   Prednisone Other (See Comments)   Pt states she cannot take due to afib   Pseudoephedrine    REACTION: causes tachycardia      Medication List    STOP taking these medications   aspirin 81 MG chewable tablet Replaced by: aspirin 325 MG tablet   guaiFENesin 600 MG 12 hr tablet Commonly known as: MUCINEX   HYDROcodone-acetaminophen 5-325 MG tablet Commonly known as: NORCO/VICODIN   meloxicam 15 MG tablet Commonly known as: MOBIC     TAKE these medications   albuterol 108 (90 Base) MCG/ACT inhaler Commonly known as: VENTOLIN HFA Inhale 1-2 puffs into the lungs every 6 (six) hours as needed for wheezing or shortness of breath.   apixaban 5 MG Tabs tablet Commonly known as: ELIQUIS Take 1 tablet (5 mg total) by mouth 2 (two) times daily. Start taking on: February 24, 2021   aspirin 325 MG tablet Take 1 tablet (325 mg total) by mouth daily for 5 days. Start taking on: Feb 18, 2021 Replaces: aspirin 81 MG chewable tablet   atorvastatin 20 MG tablet Commonly known as: LIPITOR Take 1 tablet (20 mg total) by mouth every evening.   clopidogrel 75 MG tablet Commonly known as: PLAVIX Take 1 tablet (75 mg total) by mouth daily with breakfast for 5 days. Start taking on: Feb 18, 2021   digoxin 0.25 MG tablet Commonly known as: LANOXIN Take 250 mcg by mouth daily.   indapamide 2.5 MG tablet Commonly known as: LOZOL Take 2.5 mg by mouth See admin instructions. Take 1/2 tablet daily   levothyroxine 137 MCG tablet Commonly known as: SYNTHROID Take 137 mcg by mouth daily. What changed: Another medication with the same name was removed. Continue taking this medication, and follow the directions you see here.   losartan 100 MG tablet Commonly known as: COZAAR Take 100 mg by mouth daily.   pantoprazole 40 MG tablet Commonly known as: Protonix Take 1 tablet (40 mg total) by mouth daily with breakfast. Start taking on: Feb 18, 2021   pindolol 5 MG tablet Commonly known as: VISKEN Take 2.5 mg by mouth daily. One half tablet daily   traMADol 50 MG tablet Commonly known as: ULTRAM Take 25 mg  by mouth every 6 (six) hours as needed.       Follow-up Information    Phillips Odor, MD. Schedule an appointment as soon as possible for a visit in 1 month(s).   Specialty: Neurology Why: Stroke Follow Up Appointment  Contact information: [address] [place] [address] [phone]        Caryl Bis, MD. Schedule an appointment as soon as possible for a visit in 1 week(s).   Specialty: Family Medicine Why: Hospital Follow Up appointment  Contact information: [address] [place] [address] [phone]              Allergies  Allergen Reactions  . Beta Adrenergic Blockers     REACTION: bradycardia and hypotension. Pt reports she is sensitive to the high doses, but she can take the lower doses.  . Flecainide Swelling  . Levaquin  [Levofloxacin] Other (See Comments)    bradycardia  . Metoprolol Succinate     REACTION: severe hypotension  . Prednisone Other (See Comments)    Pt states she cannot take due to afib  . Pseudoephedrine     REACTION: causes tachycardia   Allergies as of 02/17/2021      Reactions   Beta Adrenergic Blockers    REACTION: bradycardia and hypotension. Pt reports she is sensitive to the high doses, but she can take the lower doses.   Flecainide Swelling   Levaquin [levofloxacin] Other (See Comments)   bradycardia   Metoprolol Succinate    REACTION: severe hypotension   Prednisone Other (See Comments)   Pt states she cannot take due to afib   Pseudoephedrine    REACTION: causes tachycardia      Medication List    STOP taking these medications   aspirin 81 MG chewable tablet Replaced by: aspirin 325 MG tablet   guaiFENesin 600 MG 12 hr tablet Commonly known as: MUCINEX   HYDROcodone-acetaminophen 5-325 MG tablet Commonly known as: NORCO/VICODIN   meloxicam 15 MG tablet Commonly known as: MOBIC     TAKE these medications   albuterol 108 (90 Base) MCG/ACT inhaler Commonly known as: VENTOLIN HFA Inhale 1-2 puffs into the lungs every 6 (six) hours as needed for wheezing or shortness of breath.   apixaban 5 MG Tabs tablet Commonly known as: ELIQUIS Take 1 tablet (5 mg total) by mouth 2 (two) times daily. Start taking on: February 24, 2021   aspirin 325 MG tablet Take 1 tablet (325 mg total) by mouth daily for 5 days. Start taking on: Feb 18, 2021 Replaces: aspirin 81 MG chewable tablet   atorvastatin 20 MG tablet Commonly known as: LIPITOR Take 1 tablet (20 mg total) by mouth every evening.   clopidogrel 75 MG tablet Commonly known as: PLAVIX Take 1 tablet (75 mg total) by mouth daily with breakfast for 5 days. Start taking on: Feb 18, 2021   digoxin 0.25 MG tablet Commonly known as: LANOXIN Take 250 mcg by mouth daily.   indapamide 2.5 MG tablet Commonly known as:  LOZOL Take 2.5 mg by mouth See admin instructions. Take 1/2 tablet daily   levothyroxine 137 MCG tablet Commonly known as: SYNTHROID Take 137 mcg by mouth daily. What changed: Another medication with the same name was removed. Continue taking this medication, and follow the directions you see here.   losartan 100 MG tablet Commonly known as: COZAAR Take 100 mg by mouth daily.   pantoprazole 40 MG tablet Commonly known as: Protonix Take 1 tablet (  40 mg total) by mouth daily with breakfast. Start taking on: Feb 18, 2021   pindolol 5 MG tablet Commonly known as: VISKEN Take 2.5 mg by mouth daily. One half tablet daily   traMADol 50 MG tablet Commonly known as: ULTRAM Take 25 mg by mouth every 6 (six) hours as needed.       Procedures/Studies: CT HEAD WO CONTRAST  Result Date: 02/15/2021 CLINICAL DATA:  80 year old with left facial droop. Left-sided weakness. EXAM: CT HEAD WITHOUT CONTRAST TECHNIQUE: Contiguous axial images were obtained from the base of the skull through the vertex without intravenous contrast. COMPARISON:  None. FINDINGS: Brain: Areas of low-density in the right temporoparietal region. Areas of low-density in the right frontal-parietal region. Mild effacement of the sulci in the right frontoparietal region. Findings are suggestive for edema and suspect recent infarct or insult. There is no evidence for hemorrhage, midline shift or hydrocephalus. Vascular: No hyperdense vessel or unexpected calcification. Skull: Normal. Negative for fracture or focal lesion. Sinuses/Orbits: Visualized paranasal sinuses are clear. Other: None IMPRESSION: Areas of low density in the right cerebral hemisphere are most compatible with cytotoxic edema and suspect a recent infarct. No evidence for acute hemorrhage. These intracranial finding could be better characterized with MRI. These results were called by telephone at the time of interpretation on 02/15/2021 at 4:04 pm to provider DAVID  [facility] , who verbally acknowledged these results. Electronically Signed   By: Markus Daft M.D.   On: 02/15/2021 16:08   MR ANGIO HEAD WO CONTRAST  Result Date: 02/16/2021 CLINICAL DATA:  Altered mental status, weakness; stroke on CT EXAM: MRI HEAD WITHOUT CONTRAST MRA HEAD WITHOUT CONTRAST TECHNIQUE: Multiplanar, multi-echo pulse sequences of the brain and surrounding structures were acquired without intravenous contrast. Angiographic images of the Circle of Willis were acquired using MRA technique without intravenous contrast. COMPARISON: No pertinent prior exam. COMPARISON:  No pertinent prior exam. FINDINGS: MRI HEAD FINDINGS Brain: Areas of subcortical/cortical restricted diffusion present in the right frontal, parietal, and temporal lobes reflecting acute MCA infarcts. There is some associate susceptibility in the superior right temporal lobe reflecting petechial hemorrhage. No significant mass effect. Other patchy foci of T2 hyperintensity in the supratentorial white matter are nonspecific but probably reflect mild chronic microvascular ischemic changes ventricles and sulci are within normal limits in size and configuration. No intracranial mass. There is no hydrocephalus or extra-axial fluid collection. Vascular: Major vessel flow voids at the skull base are preserved. Skull and upper cervical spine: Normal marrow signal is preserved. Sinuses/Orbits: Paranasal sinuses are aerated. Orbits are unremarkable. Other: Sella is unremarkable.  Mastoid air cells are clear. MRA HEAD FINDINGS Anterior circulation: Intracranial internal carotid arteries are patent. Anterior cerebral arteries are patent. Anterior communicating artery is present. Left MCA is patent. Right M1 MCA is patent. There is short segment high-grade stenosis of the origin of the posterior division. There is partial occlusion or high-grade stenosis at the origin of the anterior division with diminished flow related enhancement of the M2 portion.  Posterior circulation: Intracranial vertebral arteries are partially imaged but patent. Basilar artery is patent. Posterior cerebral arteries are patent. IMPRESSION: Several small to moderate size acute right MCA territory infarcts. Minimal petechial hemorrhage in the temporal lobe. Short segment high-grade stenosis of right MCA posterior division origin. Partial occlusion or high-grade stenosis of right MCA anterior division origin with diminished flow within M2 portion. Electronically Signed   By: Macy Mis M.D.   On: 02/16/2021 10:30   MR BRAIN WO CONTRAST  Result Date: 02/16/2021 CLINICAL DATA:  Altered mental status, weakness; stroke on CT EXAM: MRI HEAD WITHOUT CONTRAST MRA HEAD WITHOUT CONTRAST TECHNIQUE: Multiplanar, multi-echo pulse sequences of the brain and surrounding structures were acquired without intravenous contrast. Angiographic images of the Circle of Willis were acquired using MRA technique without intravenous contrast. COMPARISON: No pertinent prior exam. COMPARISON:  No pertinent prior exam. FINDINGS: MRI HEAD FINDINGS Brain: Areas of subcortical/cortical restricted diffusion present in the right frontal, parietal, and temporal lobes reflecting acute MCA infarcts. There is some associate susceptibility in the superior right temporal lobe reflecting petechial hemorrhage. No significant mass effect. Other patchy foci of T2 hyperintensity in the supratentorial white matter are nonspecific but probably reflect mild chronic microvascular ischemic changes ventricles and sulci are within normal limits in size and configuration. No intracranial mass. There is no hydrocephalus or extra-axial fluid collection. Vascular: Major vessel flow voids at the skull base are preserved. Skull and upper cervical spine: Normal marrow signal is preserved. Sinuses/Orbits: Paranasal sinuses are aerated. Orbits are unremarkable. Other: Sella is unremarkable.  Mastoid air cells are clear. MRA HEAD FINDINGS  Anterior circulation: Intracranial internal carotid arteries are patent. Anterior cerebral arteries are patent. Anterior communicating artery is present. Left MCA is patent. Right M1 MCA is patent. There is short segment high-grade stenosis of the origin of the posterior division. There is partial occlusion or high-grade stenosis at the origin of the anterior division with diminished flow related enhancement of the M2 portion. Posterior circulation: Intracranial vertebral arteries are partially imaged but patent. Basilar artery is patent. Posterior cerebral arteries are patent. IMPRESSION: Several small to moderate size acute right MCA territory infarcts. Minimal petechial hemorrhage in the temporal lobe. Short segment high-grade stenosis of right MCA posterior division origin. Partial occlusion or high-grade stenosis of right MCA anterior division origin with diminished flow within M2 portion. Electronically Signed   By: Macy Mis M.D.   On: 02/16/2021 10:30   US Carotid Bilateral (at [facility] and [facility] only)  Result Date: 02/16/2021 CLINICAL DATA:  Syncope and collapse EXAM: BILATERAL CAROTID DUPLEX ULTRASOUND TECHNIQUE: Pearline Cables scale imaging, color Doppler and duplex ultrasound were performed of bilateral carotid and vertebral arteries in the neck. COMPARISON:  None. FINDINGS: Criteria: Quantification of carotid stenosis is based on velocity parameters that correlate the residual internal carotid diameter with NASCET-based stenosis levels, using the diameter of the distal internal carotid lumen as the denominator for stenosis measurement. The following velocity measurements were obtained: RIGHT ICA: 58/13 cm/sec CCA: 989/21 cm/sec SYSTOLIC ICA/CCA RATIO:  0.5 ECA: 82 cm/sec LEFT ICA: 89/19 cm/sec CCA: 19/41 cm/sec SYSTOLIC ICA/CCA RATIO:  0.9 ECA: 94 cm/sec RIGHT CAROTID ARTERY: Minor intimal thickening and hypoechoic plaque formation. No hemodynamically significant right ICA stenosis, velocity elevation, or  turbulent flow. Degree of narrowing less than 50%. RIGHT VERTEBRAL ARTERY:  Normal antegrade flow LEFT CAROTID ARTERY: Similar diffuse intimal thickening and echogenic plaque formation. No hemodynamically significant left ICA stenosis, velocity elevation, or turbulent flow. LEFT VERTEBRAL ARTERY:  Normal antegrade flow IMPRESSION: Bilateral carotid atherosclerosis. No hemodynamically significant ICA stenosis. Degree of narrowing less than 50% bilaterally by ultrasound criteria. Patent antegrade vertebral flow bilaterally Electronically Signed   By: Jerilynn Mages.  Shick M.D.   On: 02/16/2021 10:42   ECHOCARDIOGRAM COMPLETE  Result Date: 02/16/2021    ECHOCARDIOGRAM REPORT   Patient Name:   KIRSTINE JACQUIN Date of Exam: 02/16/2021 Medical Rec #:  [id number]         Height:  65.5 in Accession #:    [id number]        Weight:       168.4 lb Date of Birth:  1941-05-30        BSA:          1.849 m Patient Age:    19 years          BP:           158/79 mmHg Patient Gender: F                 HR:           76 bpm. Exam Location:  [facility] Procedure: 2D Echo, Cardiac Doppler and Color Doppler Indications:    Stroke I63.9  History:        Patient has prior history of Echocardiogram examinations, most                 recent 12/03/2015. Arrythmias:Atrial Fibrillation; Risk                 Factors:Hypertension. Biatrial enlargement (From Hx), GERD.  Sonographer:    Alvino Chapel RCS Referring Phys: [place]  1. Left ventricular ejection fraction, by estimation, is 55 to 60%. The left ventricle has normal function. The left ventricle has no regional wall motion abnormalities. Left ventricular diastolic parameters are indeterminate.  2. Right ventricular systolic function is normal. The right ventricular size is normal. There is normal pulmonary artery systolic pressure. The estimated right ventricular systolic pressure is 50.2 mmHg.  3. Left atrial size was mildly dilated.  4. Right atrial size was  severely dilated.  5. The mitral valve is grossly normal. Mild mitral valve regurgitation.  6. The aortic valve is tricuspid. Aortic valve regurgitation is not visualized.  7. The inferior vena cava is normal in size with greater than 50% respiratory variability, suggesting right atrial pressure of 3 mmHg. FINDINGS  Left Ventricle: Left ventricular ejection fraction, by estimation, is 55 to 60%. The left ventricle has normal function. The left ventricle has no regional wall motion abnormalities. The left ventricular internal cavity size was normal in size. There is  borderline left ventricular hypertrophy. Left ventricular diastolic parameters are indeterminate. Right Ventricle: The right ventricular size is normal. No increase in right ventricular wall thickness. Right ventricular systolic function is normal. There is normal pulmonary artery systolic pressure. The tricuspid regurgitant velocity is 2.70 m/s, and  with an assumed right atrial pressure of 3 mmHg, the estimated right ventricular systolic pressure is 77.4 mmHg. Left Atrium: Left atrial size was mildly dilated. Right Atrium: Right atrial size was severely dilated. Pericardium: There is no evidence of pericardial effusion. Mitral Valve: The mitral valve is grossly normal. Mild mitral valve regurgitation. Tricuspid Valve: The tricuspid valve is grossly normal. Tricuspid valve regurgitation is mild. Aortic Valve: The aortic valve is tricuspid. Aortic valve regurgitation is not visualized. Pulmonic Valve: The pulmonic valve was grossly normal. Pulmonic valve regurgitation is mild. Aorta: The aortic root is normal in size and structure. Venous: The inferior vena cava is normal in size with greater than 50% respiratory variability, suggesting right atrial pressure of 3 mmHg. IAS/Shunts: No atrial level shunt detected by color flow Doppler.  LEFT VENTRICLE PLAX 2D LVIDd:         4.90 cm LVIDs:         2.80 cm LV PW:         1.00 cm LV IVS:  1.00 cm LVOT  diam:     1.90 cm LV SV:         52 LV SV Index:   28 LVOT Area:     2.84 cm  RIGHT VENTRICLE RV S prime:     12.90 cm/s TAPSE (M-mode): 2.0 cm LEFT ATRIUM             Index       RIGHT ATRIUM           Index LA diam:        4.30 cm 2.33 cm/m  RA Area:     28.10 cm LA Vol (A2C):   51.2 ml 27.69 ml/m RA Volume:   98.20 ml  53.10 ml/m LA Vol (A4C):   63.6 ml 34.39 ml/m LA Biplane Vol: 58.8 ml 31.80 ml/m  AORTIC VALVE LVOT Vmax:   94.70 cm/s LVOT Vmean:  61.200 cm/s LVOT VTI:    0.182 m  AORTA Ao Root diam: 2.90 cm MITRAL VALVE               TRICUSPID VALVE MV Area (PHT): 3.37 cm    TR Peak grad:   29.2 mmHg MV Decel Time: 225 msec    TR Vmax:        270.00 cm/s MV E velocity: 92.10 cm/s                            SHUNTS                            Systemic VTI:  0.18 m                            Systemic Diam: 1.90 cm Rozann Lesches MD Electronically signed by Rozann Lesches MD Signature Date/Time: 02/16/2021/3:35:12 PM    Final      Subjective: Pt reports that she feels much better, she has been ambulating.  She is agreeable to home healthcare.    Discharge Exam: Vitals:   02/17/21 0918 02/17/21 1308  BP: (!) 152/121 128/90  Pulse: 87 65  Resp:  18  Temp:  98.1 F (36.7 C)  SpO2:  97%   Vitals:   02/17/21 0240 02/17/21 0915 02/17/21 0918 02/17/21 1308  BP: (!) 185/50  (!) 152/121 128/90  Pulse: 75 82 87 65  Resp: 19   18  Temp: 97.7 F (36.5 C)   98.1 F (36.7 C)  TempSrc:    Oral  SpO2: 97%   97%  Weight:      Height:        General: Pt is alert, awake, not in acute distress Cardiovascular: normal S1/S2 +, no rubs, no gallops Respiratory: CTA bilaterally, no wheezing, no rhonchi Abdominal: Soft, NT, ND, bowel sounds + Extremities: no edema, no cyanosis Neurological: left hemiparesis 4/5 , right 5/5 UE/LE   The results of significant diagnostics from this hospitalization (including imaging, microbiology, ancillary and laboratory) are listed below for reference.     Microbiology: Recent Results (from the past 240 hour(s))  Resp Panel by RT-PCR (Flu A&B, Covid) Nasopharyngeal Swab     Status: None   Collection Time: 02/15/21  5:00 PM   Specimen: Nasopharyngeal Swab; Nasopharyngeal(NP) swabs in vial transport medium  Result Value Ref Range Status   SARS Coronavirus 2 by RT PCR NEGATIVE NEGATIVE Final    Comment: (  NOTE) SARS-CoV-2 target nucleic acids are NOT DETECTED.  The SARS-CoV-2 RNA is generally detectable in upper respiratory specimens during the acute phase of infection. The lowest concentration of SARS-CoV-2 viral copies this assay can detect is 138 copies/mL. A negative result does not preclude SARS-Cov-2 infection and should not be used as the sole basis for treatment or other patient management decisions. A negative result may occur with  improper specimen collection/handling, submission of specimen other than nasopharyngeal swab, presence of viral mutation(s) within the areas targeted by this assay, and inadequate number of viral copies(<138 copies/mL). A negative result must be combined with clinical observations, patient history, and epidemiological information. The expected result is Negative.  Fact Sheet for Patients:  EntrepreneurPulse.com.au  Fact Sheet for Healthcare Providers:  IncredibleEmployment.be  This test is no t yet approved or cleared by the [place] FDA and  has been authorized for detection and/or diagnosis of SARS-CoV-2 by FDA under an Emergency Use Authorization (EUA). This EUA will remain  in effect (meaning this test can be used) for the duration of the COVID-19 declaration under Section 564(b)(1) of the Act, 21 U.S.C.section 360bbb-3(b)(1), unless the authorization is terminated  or revoked sooner.       Influenza A by PCR NEGATIVE NEGATIVE Final   Influenza B by PCR NEGATIVE NEGATIVE Final    Comment: (NOTE) The Xpert Xpress SARS-CoV-2/FLU/RSV plus assay is  intended as an aid in the diagnosis of influenza from Nasopharyngeal swab specimens and should not be used as a sole basis for treatment. Nasal washings and aspirates are unacceptable for Xpert Xpress SARS-CoV-2/FLU/RSV testing.  Fact Sheet for Patients: EntrepreneurPulse.com.au  Fact Sheet for Healthcare Providers: IncredibleEmployment.be  This test is not yet approved or cleared by the [place] FDA and has been authorized for detection and/or diagnosis of SARS-CoV-2 by FDA under an Emergency Use Authorization (EUA). This EUA will remain in effect (meaning this test can be used) for the duration of the COVID-19 declaration under Section 564(b)(1) of the Act, 21 U.S.C. section 360bbb-3(b)(1), unless the authorization is terminated or revoked.  Performed at [facility], [address] [place], [place] [address]      Labs: BNP (last 3 results) No results for input(s): BNP in the last 8760 hours. Basic Metabolic Panel: Recent Labs  Lab 02/15/21 1535 02/16/21 0908 02/17/21 0458  NA 137 138 139  K 3.9 3.5 3.7  CL 102 105 108  CO2 26 25 27   GLUCOSE 112* 102* 90  BUN 26* 29* 23  CREATININE 0.85 0.79 0.74  CALCIUM 9.2 8.7* 8.2*  MG  --  2.0 2.0  PHOS  --  3.0 2.6   Liver Function Tests: Recent Labs  Lab 02/15/21 1535 02/16/21 0908 02/17/21 0458  AST 32 35 29  ALT 23 25 20   ALKPHOS 76 72 56  BILITOT 2.3* 2.3* 1.7*  PROT 8.2* 7.6 6.3*  ALBUMIN 3.9 3.7 3.0*   No results for input(s): LIPASE, AMYLASE in the last 168 hours. No results for input(s): AMMONIA in the last 168 hours. CBC: Recent Labs  Lab 02/15/21 1535 02/16/21 0908 02/17/21 0458  WBC 12.3* 9.3 7.1  NEUTROABS 9.2* 6.5 4.4  HGB 13.7 13.4 11.2*  HCT 43.8 43.6 36.4  MCV 86.2 88.1 87.7  PLT 285 244 216   Cardiac Enzymes: No results for input(s): CKTOTAL, CKMB, CKMBINDEX, TROPONINI in the last 168 hours. BNP: Invalid input(s): POCBNP CBG: No results for  input(s): GLUCAP in the last 168 hours. D-Dimer No results for  input(s): DDIMER in the last 72 hours. Hgb A1c Recent Labs    02/16/21 0500  HGBA1C 6.4*   Lipid Profile Recent Labs    02/16/21 0500  CHOL 133  HDL 37*  LDLCALC 80  TRIG 80  CHOLHDL 3.6   Thyroid function studies No results for input(s): TSH, T4TOTAL, T3FREE, THYROIDAB in the last 72 hours.  Invalid input(s): FREET3 Anemia work up No results for input(s): VITAMINB12, FOLATE, FERRITIN, TIBC, IRON, RETICCTPCT in the last 72 hours. Urinalysis    Component Value Date/Time   COLORURINE YELLOW 02/15/2021 1535   APPEARANCEUR HAZY (A) 02/15/2021 1535   LABSPEC 1.024 02/15/2021 1535   PHURINE 5.0 02/15/2021 1535   GLUCOSEU NEGATIVE 02/15/2021 1535   HGBUR LARGE (A) 02/15/2021 1535   BILIRUBINUR NEGATIVE 02/15/2021 1535   KETONESUR NEGATIVE 02/15/2021 1535   PROTEINUR 30 (A) 02/15/2021 1535   NITRITE NEGATIVE 02/15/2021 1535   LEUKOCYTESUR NEGATIVE 02/15/2021 1535   Sepsis Labs Invalid input(s): PROCALCITONIN,  WBC,  LACTICIDVEN Microbiology Recent Results (from the past 240 hour(s))  Resp Panel by RT-PCR (Flu A&B, Covid) Nasopharyngeal Swab     Status: None   Collection Time: 02/15/21  5:00 PM   Specimen: Nasopharyngeal Swab; Nasopharyngeal(NP) swabs in vial transport medium  Result Value Ref Range Status   SARS Coronavirus 2 by RT PCR NEGATIVE NEGATIVE Final    Comment: (NOTE) SARS-CoV-2 target nucleic acids are NOT DETECTED.  The SARS-CoV-2 RNA is generally detectable in upper respiratory specimens during the acute phase of infection. The lowest concentration of SARS-CoV-2 viral copies this assay can detect is 138 copies/mL. A negative result does not preclude SARS-Cov-2 infection and should not be used as the sole basis for treatment or other patient management decisions. A negative result may occur with  improper specimen collection/handling, submission of specimen other than nasopharyngeal swab,  presence of viral mutation(s) within the areas targeted by this assay, and inadequate number of viral copies(<138 copies/mL). A negative result must be combined with clinical observations, patient history, and epidemiological information. The expected result is Negative.  Fact Sheet for Patients:  EntrepreneurPulse.com.au  Fact Sheet for Healthcare Providers:  IncredibleEmployment.be  This test is no t yet approved or cleared by the [place] FDA and  has been authorized for detection and/or diagnosis of SARS-CoV-2 by FDA under an Emergency Use Authorization (EUA). This EUA will remain  in effect (meaning this test can be used) for the duration of the COVID-19 declaration under Section 564(b)(1) of the Act, 21 U.S.C.section 360bbb-3(b)(1), unless the authorization is terminated  or revoked sooner.       Influenza A by PCR NEGATIVE NEGATIVE Final   Influenza B by PCR NEGATIVE NEGATIVE Final    Comment: (NOTE) The Xpert Xpress SARS-CoV-2/FLU/RSV plus assay is intended as an aid in the diagnosis of influenza from Nasopharyngeal swab specimens and should not be used as a sole basis for treatment. Nasal washings and aspirates are unacceptable for Xpert Xpress SARS-CoV-2/FLU/RSV testing.  Fact Sheet for Patients: EntrepreneurPulse.com.au  Fact Sheet for Healthcare Providers: IncredibleEmployment.be  This test is not yet approved or cleared by the [place] FDA and has been authorized for detection and/or diagnosis of SARS-CoV-2 by FDA under an Emergency Use Authorization (EUA). This EUA will remain in effect (meaning this test can be used) for the duration of the COVID-19 declaration under Section 564(b)(1) of the Act, 21 U.S.C. section 360bbb-3(b)(1), unless the authorization is terminated or revoked.  Performed at [facility], [address],  [place], [place] [address]    Time coordinating  discharge: 41 mins   SIGNED:  Irwin Brakeman, MD  [facility] 02/17/2021, 1:10 PM How to contact the [facility] Attending or Consulting provider [place] or covering provider during after hours [place], for this patient?  1. Check the care team in [facility] and look for a) attending/consulting [facility] provider listed and b) the [facility] team listed 2. Log into [email or web address] and use [place] universal password to access. If you do not have the password, please contact the hospital operator. 3. Locate the [facility] provider you are looking for under [facility] and page to a number that you can be directly reached. 4. If you still have difficulty reaching the provider, please page the [facility] (Director on Call) for the Hospitalists listed on amion for assistance.

## 2021-02-17 NOTE — Plan of Care (Signed)
  Problem: Education: Goal: Knowledge of disease or condition will improve Outcome: Adequate for Discharge Goal: Knowledge of secondary prevention will improve Outcome: Adequate for Discharge Goal: Knowledge of patient specific risk factors addressed and post discharge goals established will improve Outcome: Adequate for Discharge   Problem: Coping: Goal: Will verbalize positive feelings about self Outcome: Adequate for Discharge Goal: Will identify appropriate support needs Outcome: Adequate for Discharge   Problem: Health Behavior/Discharge Planning: Goal: Ability to manage health-related needs will improve Outcome: Adequate for Discharge   Problem: Self-Care: Goal: Ability to participate in self-care as condition permits will improve Outcome: Adequate for Discharge Goal: Verbalization of feelings and concerns over difficulty with self-care will improve Outcome: Adequate for Discharge   Problem: Education: Goal: Knowledge of General Education information will improve Description: Including pain rating scale, medication(s)/side effects and non-pharmacologic comfort measures Outcome: Adequate for Discharge   Problem: Health Behavior/Discharge Planning: Goal: Ability to manage health-related needs will improve Outcome: Adequate for Discharge   Problem: Clinical Measurements: Goal: Ability to maintain clinical measurements within normal limits will improve Outcome: Adequate for Discharge Goal: Will remain free from infection Outcome: Adequate for Discharge Goal: Diagnostic test results will improve Outcome: Adequate for Discharge Goal: Respiratory complications will improve Outcome: Adequate for Discharge Goal: Cardiovascular complication will be avoided Outcome: Adequate for Discharge   Problem: Activity: Goal: Risk for activity intolerance will decrease Outcome: Adequate for Discharge   Problem: Nutrition: Goal: Adequate nutrition will be maintained Outcome:  Adequate for Discharge   Problem: Coping: Goal: Level of anxiety will decrease Outcome: Adequate for Discharge   Problem: Elimination: Goal: Will not experience complications related to bowel motility Outcome: Adequate for Discharge Goal: Will not experience complications related to urinary retention Outcome: Adequate for Discharge   Problem: Pain Managment: Goal: General experience of comfort will improve Outcome: Adequate for Discharge   Problem: Safety: Goal: Ability to remain free from injury will improve Outcome: Adequate for Discharge   Problem: Skin Integrity: Goal: Risk for impaired skin integrity will decrease Outcome: Adequate for Discharge

## 2021-02-17 NOTE — Progress Notes (Signed)
  Speech Language Pathology Treatment: Dysphagia  Patient Details Name: Debbie Rubio MRN: [id number] DOB: [birth year] Today's Date: 02/17/2021 Time: 5465-0354 SLP Time Calculation (min) (ACUTE ONLY): 13 min  Assessment / Plan / Recommendation Clinical Impression  Pt was provided ongoing diagnostic dysphagia therapy targeting trials of D3/mech soft and thin liquids. Pt continued to demonstrate prolonged oral prep and mild L buccal pocketing; Pt benefits from liquid wash to facilitate clearance of bolus. Again echo from BSE Pt requires cues to not talk during PO trials. RN reports Pt took meds whole with liquids without incident this am. Pt does continue to be at risk for aspiration secondary to acute CVA and L sided weakness. Recommend continue with D3/mech soft diet and thin liquids. ST will continue to follow acutely.    HPI HPI: Debbie Rubio  is a 80 y.o. female, past medical history of atrial fibrillation, only on aspirin, hypothyroidism, hypertension, she presents to ED with left-sided facial droop, left eyelid ptosis, slurred speech, and visual deficits on the left, reports she fell in the bathroom today, struck the right side of her head, she is herself unaware of these deficits, she thinks she woke up at her usual health, reports she fell in the bathroom in the morning unsure 7 or 9 AM, reports she was not able to stand up, family tried to reach her, when they were unable, son went to see her, found her on the floor in the afternoon, where she was brought by EMS, she denies fever, chills, chest pain, nausea, vomiting or diarrhea.  - in ED CT head significant for acute CVA, areas of low-density in the right cerebral hemisphere compatible with cytotoxic edema and recent infarct, no evidence of acute hemorrhage, [facility] consulted to admit. MRI shows Several small to moderate size acute right MCA territory infarcts.  Minimal petechial hemorrhage in the temporal lobe. Pt failed the  [place]. BSE/SLE ordered.      SLP Plan     Patient needs continued Speech Lanaguage Pathology Services    Recommendations  Diet recommendations: Dysphagia 3 (mechanical soft);Thin liquid Liquids provided via: Cup;Straw Medication Administration: Whole meds with liquid Supervision: Patient able to self feed Compensations: Slow rate;Small sips/bites;Lingual sweep for clearance of pocketing                Oral Care Recommendations: Oral care BID;Staff/trained caregiver to provide oral care Follow up Recommendations: Home health SLP SLP Visit Diagnosis: Dysphagia, unspecified (R13.10);Dysarthria and anarthria (R47.1);Cognitive communication deficit (R41.841)       Lilyannah Zuelke H. Roddie Mc, CCC-SLP Speech Language Pathologist    Wende Bushy 02/17/2021, 11:33 AM

## 2021-02-17 NOTE — Discharge Instructions (Signed)
Pay careful attention to the dates:  Take aspirin and plavix for 5 more days thru 6/2 then stop  Start taking apixaban twice daily on 6/3    Cognitive Rehabilitation After a Stroke After a stroke, you may have various problems with thinking (cognitive disability). The types of problems you have will depend on how severe the stroke was and where it was located in the brain. Problems may include:  Problems with short-term memory.  Trouble paying attention.  Trouble communicating or understanding language (aphasia).  A drop in mental ability that may interfere with daily life (dementia).  Trouble with problem-solving and information processing.  Problems with reading, writing, or math.  Problems with your ability to plan and to perform activities in sequence (executive function). These problems can feel overwhelming. However, with rehabilitation and time to heal, many people have improvement in their symptoms. What causes cognitive disability? A stroke happens when blood cannot flow to certain areas of the brain. When this happens, brain cells die in the affected areas because they cannot get oxygen and nutrients from the blood. Cognitive disability is caused by the death of cells in the areas of the brain that control thinking. What is cognitive rehabilitation? Cognitive rehabilitation is a program to help you improve your thinking skills after a stroke. Rehabilitation cannot completely reverse the effects of a stroke, but it can help you with memory, problem-solving, and communication skills. Therapy focuses on:  Improving brain function. This may involve activities such as learning to break down tasks into simple steps.  Helping you learn ways to cope with thinking problems. For example, you might learn memory tricks or do activities that stimulate memory, such as naming objects or describing pictures. Cognitive rehabilitation may include:  Speech-language therapy to help you  understand and use language to communicate.  Occupational therapy to help you perform daily activities.  Music therapy to help relieve stress, anxiety, and depression. This may involve listening to music, singing, or playing instruments.  Physical therapy to help improve your ability to move and perform actions that involve the muscles (motor functions).   When will therapy start and where will I have therapy? Your health care provider will decide when it is best for you to start therapy. In some cases, people start rehabilitation as soon as their health is stable, which may be 24-48 hours after the stroke. Rehabilitation can take place in a few different places, based on your needs. It may take place in:  The hospital or an in-patient rehabilitation hospital.  An outpatient rehabilitation facility.  A long-term care facility.  A community rehabilitation clinic.  Your home. What are some tools to help after a stroke? There are a number of tools and apps that you can use on your smartphone, personal computer, or tablet to help improve brain function. Some of these apps include:  Calendar reminders or alarm apps to help with memory.  Note-taking or sketch pad apps to help with memory or communication.  Text-to-speech apps that allow you to listen to what you are reading, which helps your ability to understanding text.  Picture dictionary or picture message apps to help with communication.  E-readers. These can highlight text as it is read aloud, which helps with listening and reading skills. How can my friends or family help during my rehabilitation? During your recovery, it is important that your friends and family members help you work toward more independence. Your caregivers should speak with your health care providers to learn how  they can best help you during recovery. This may include working on speech-language or memory exercises at home, or helping with daily tasks and  errands. If you have cognitive disability, you may be at risk for injury or accidents at home, such as forgetting to turn off the stove. Friends and family members can help ensure home safety by taking steps such as getting appliances with automatic shut-off features or storing dangerous objects in a secure place. What else should I know about cognitive rehabilitation after a stroke? Having trouble with memory and problem-solving can make you feel alone. You may also have mood changes, anxiety, or depression after a stroke. It is important to:  Stay connected with others through social groups, online support groups, or your community.  Talk to your friends, family, and caregivers about any emotional problems you are having.  Go to one-on-one or group therapy as suggested by your health care provider.  Stay physically active and exercise as often as suggested by your health care provider. Summary  After a stroke, some people have problems with thinking that affect attention, memory, language, communication, and problem-solving.  Cognitive rehabilitation is a program to help you regain brain function and learn skills to cope with thinking problems.  Rehabilitation cannot completely reverse the effects of a stroke, but it can help to improve quality of life.  Cognitive rehabilitation may include speech-language therapy, occupational therapy, music therapy, and physical therapy. This information is not intended to replace advice given to you by your health care provider. Make sure you discuss any questions you have with your health care provider. Document Revised: 12/31/2018 Document Reviewed: 12/14/2016 Elsevier Patient Education  2021 [place] After Stroke A stroke causes damage to the brain cells, which can affect your ability to walk, talk, and even eat. The impact of a stroke is different for everyone, and so is recovery. A good nutrition plan is important for your  recovery. It can also lower your risk of another stroke. If you have difficulty chewing and swallowing your food, a dietitian or your stroke care team can help so that you can enjoy eating healthy foods. What are tips for following this plan? Reading food labels  Choose foods that have less than 300 milligrams (mg) of sodium per serving. Limit your sodium intake to less than 1,500 mg per day.  Avoid foods that have saturated fat and trans fat.  Choose foods that are low in cholesterol. Limit the amount of cholesterol you eat each day to less than 200 mg.  Choose foods that are high in fiber. Eat 20-30 grams (g) of fiber each day.  Avoid foods with added sugar. Check the food label for ingredients such as sugar, corn syrup, honey, fructose, molasses, and cane juice. Shopping  At the grocery store, buy most of your food from areas near the walls of the store. This includes: ? Fresh fruits and vegetables. ? Dry grains, beans, nuts, and seeds. ? Fresh seafood, poultry, lean meats, and eggs. ? Low-fat dairy products.  Buy whole ingredients instead of prepackaged foods.  Buy fresh, in-season fruits and vegetables from local farmers markets.  Buy frozen fruits and vegetables in resealable bags. Cooking  Prepare foods with very little salt. Use herbs or salt-free spices instead.  Cook with heart-healthy oils, such as olive, avocado, canola, soybean, or sunflower oil.  Avoid frying foods. Bake, grill, or broil foods instead.  Remove visible fat and skin from meat and poultry before eating.  Modify food textures as told by your health care provider. Meal planning  Eat a wide variety of colorful fruits and vegetables. Make sure one-half of your plate is filled with fruits and vegetables at each meal.  Eat fruits and vegetables that are high in potassium, such as: ? Apples, bananas, oranges, and melon. ? Sweet potatoes, spinach, zucchini, and tomatoes.  Eat fish that contain  heart-healthy fats (omega-3 fats) at least twice a week. These include salmon, tuna, mackerel, and sardines.  Eat plant foods that are high in omega-3 fats, such as flaxseeds and walnuts. Add these to cereals, yogurt, or pasta dishes.  Eat several servings of high-fiber foods each day, such as fruits, vegetables, whole grains, and beans.  Do not put salt at the table for meals.  When eating out at restaurants: ? Ask the server about low-salt or salt-free food options. ? Avoid fried foods. Look for menu items that are grilled, steamed, broiled, or roasted. ? Ask if your food can be prepared without butter. ? Ask for condiments, such as salad dressings, gravy, or sauces to be served on the side.  If you have difficulty swallowing: ? Choose foods that are softer and easier to chew and swallow. ? Cut foods into small pieces and chew well before swallowing. ? Thicken liquids as told by your health care provider or dietitian. ? Let your health care provider know if your condition does not improve over time. You may need to work with a speech therapist to re-train the muscles that are used for eating. General recommendations  Involve your family and friends in your recovery, if possible. It may be helpful to have a slower meal time and to plan meals that include foods everyone in the family can eat.  Brush your teeth with fluoride toothpaste twice a day, and floss once a day. Keeping a clean mouth can help you swallow and can also help your appetite.  Drink enough water each day to keep your urine pale yellow. If needed, set reminders or ask your family to help you remember to drink water.  Limit alcohol intake to no more than 1 drink a day for nonpregnant women and 2 drinks a day for men. One drink equals 12 oz of beer, 5 oz of wine, or 1 oz of hard liquor.   Summary  Following this eating plan can help in your stroke recovery and can decrease your risk for another stroke.  Let your health  care provider know if you have problems with swallowing. You may need to work with a speech therapist. This information is not intended to replace advice given to you by your health care provider. Make sure you discuss any questions you have with your health care provider. Document Revised: 01/01/2019 Document Reviewed: 11/18/2017 Elsevier Patient Education  2021 [place].   Physical Therapy After a Stroke After a stroke, some people experience physical changes or problems. Physical therapy may be prescribed to help you recover and overcome problems such as:  Inability to move (paralysis) or weakness, typically affecting one side of the body.  Trouble with balance.  Pain, a pins and needles sensation, or numbness in certain parts of the body. You may also have difficulty feeling touch, pressure, or changes in temperature.  Involuntary muscle tightening (spasticity).  Stiffness in muscles and joints.  Altered coordination and reflexes. What causes physical disability after a stroke? A stroke can damage parts of your brain that control your body's normal functions, including your ability  to move and to keep your balance. The types of physical problems you have will depend on how severe the stroke was and where it was located in the brain. Weakness or paralysis may affect just your fingers and hands, a whole leg or arm, or an entire side of your body. What is physical therapy? Physical therapy involves using exercises, stretches, and activities to help you regain movement and independence after your stroke. Physical therapy may focus on one or more of the following:  Range of motion. This can help with movement and reduce muscle stiffness.  Balance. This helps to lower your risk of falling.  Position changes or transfers, such as moving from sitting to standing or from a chair to a bed.  Coordination, such as getting an object from a shelf.  Muscle strength. Muscles may be  strengthened with weights or by repeating certain motions.  Functional mobility. This may include stair training or learning how to use a wheelchair, walker, or cane.  Walking (gait training).  Activities of daily living, such as getting out of the car or buttoning a shirt. Why is physical therapy important? It is important to do exercises and follow your rehabilitation plan as told by your physical therapist. Physical therapy can:  Help you regain independence.  Prevent injury from falls by building strength and balance.  Lower your risk of blood clots.  Lower your risk of skin sores (pressure injuries).  Increase physical activity and exercise. This may help lower your risk for another stroke.  Help reduce pain. When will therapy start and where will I have therapy? Your health care provider will decide when it is best for you to start therapy. In some cases, people start rehabilitation, including physical therapy, as soon as they are medically stable, which may be 24-48 hours after a stroke. Rehabilitation can take place in a few different places, based on your needs. It may take place in:  The hospital or an in-patient rehabilitation hospital.  An outpatient rehabilitation facility.  A long-term care facility.  A community rehabilitation clinic.  Your home. What are assistive devices? Assistive devices are tools to help you move, maintain balance, and manage daily tasks while recovering from a stroke. Your physical therapist may recommend and help you learn to use:  Equipment to help you move, such as wheelchairs, canes, or walkers.  Braces or splints to keep your arms, hands, legs, or feet in a comfortable and safe position.  Bathtub benches or grab bars to keep you safe in the bathroom.  Special utensils, bowls, and plates that allow you to eat with one hand. It is important to use these devices as told by your health care provider.   Summary  After a stroke, some  people may experience physical disabilities, such as weakness or paralysis, pain, or balance problems.  Physical therapy involves exercises, stretches, and activities that help to improve your ability to move and to handle daily tasks.  Physical therapy exercises focus on restoring range of motion, balance, coordination, muscle strength, and the ability to move (mobility).  Physical therapy can help you regain independence, prevent falls, and allow you to live a more active lifestyle after a stroke. This information is not intended to replace advice given to you by your health care provider. Make sure you discuss any questions you have with your health care provider. Document Revised: 01/01/2019 Document Reviewed: 12/17/2016 Elsevier Patient Education  2021 [facility] Discharge After a Stroke  Being  discharged from the hospital after a stroke can feel overwhelming. Many things may be different. It is normal to feel scared or anxious. Some stroke survivors may be able to return to their homes. Others may need more specialized care on a temporary or permanent basis. Your stroke care team will work with you to develop a discharge plan that is best for you. Ask questions if you do not understand something. Invite a friend or family member to participate in discharge planning. It is important to understand and follow your discharge plan to help prevent another stroke or other problems. General recommendations  Ask a friend or family member to get needed things in place before you go home if possible. A therapist can come to your home to make recommendations for safety equipment. Ask your health care provider if you would benefit from this service or from home care.  Take steps to prevent falls, such as: ? Installing grab bars in the shower or using a shower chair. ? Install grab bars by the toilet. ? Removing tripping hazards, such as area rugs or cords.   Supplies needed Ask your  health care provider for a list of medical equipment and supplies you will need at home. These may include:  Walkers.  Canes.  Wheelchairs.  Hand-strengthening devices.  Special eating utensils. Medical equipment can be rented or purchased, depending on your insurance coverage. Check with your insurance company about what is covered. Follow these instructions at home: Medicines  Take all medicines exactly as told by your health care provider to prevent serious harm, such as another stroke. Make sure you understand: ? What medicine to take. ? Why you are taking the medicine. ? How and when to take it. ? If it can be taken with your other medicines and herbal supplements. ? Possible side effects, and when to call your health care provider if you have side effects. ? How you will get and pay for your medicines. Medical assistance programs may be able to help you pay for prescription medicines if you cannot afford them.  If you are taking an anticoagulant: ? Be sure to take it exactly as told by your health care provider. This medicine can increase the risk of bleeding because it works to prevent blood clots. You may need to take certain precautions to prevent bleeding. ? Do not take medicines that contain aspirin or NSAIDs, such as ibuprofen, unless your health care provider approves. These medicines increase your risk for dangerous bleeding. Preventing another stroke Having a stroke puts you at risk for another stroke in the future. Ask your health care provider what actions you can take to lower the risk. These may include:  Increasing how much you exercise.  Making a healthy eating plan.  Quitting smoking.  Managing other health conditions, such as high blood pressure, high cholesterol, or diabetes.  Limiting alcohol use. General instructions  Before you leave the hospital, you will be given information about stroke warning signs. Share these with your friends and family  members.  You will need to follow up regularly with a health care provider. You may need rehabilitation, such as physical therapy, occupational therapy, and speech-language therapy. Keeping these appointments is very important to your recovery.  Be sure to bring your medicine list and discharge papers to your appointments. Use a calendar or appointment reminder if you need help to keep track of your schedule. Contact a health care provider if:  You are taking an anticoagulant, and you have  one or more of these problems: ? Bleeding or bruising. ? A fall or other injury to your head. ? Blood in your urine or stool (feces). Get help right away if you:  Have any symptoms of a stroke. "BE FAST" is an easy way to remember the main warning signs of a stroke: ? B - Balance. Signs are dizziness, sudden trouble walking, or loss of balance. ? E - Eyes. Signs are trouble seeing or a sudden change in vision. ? F - Face. Signs are sudden weakness or numbness of the face, or the face or eyelid drooping on one side. ? A - Arms. Signs are weakness or numbness in an arm. This happens suddenly and usually on one side of the body. ? S - Speech. Signs are sudden trouble speaking, slurred speech, or trouble understanding what people say. ? T - Time. Time to call emergency services. Write down what time symptoms started. Your emergency responders will need to know this information.  Have other signs of a stroke, such as: ? A sudden, severe headache with no known cause. ? Nausea or vomiting. ? Seizure. These symptoms may represent a serious problem that is an emergency. Do not wait to see if the symptoms will go away. Get medical help right away. Call your local emergency services (911 in the [place] Do not drive yourself to the hospital.   Summary  Being discharged from the hospital after a stroke can feel overwhelming. It is normal to feel scared or anxious.  Make sure you take medicines exactly as told by  your health care provider.  Know the warning signs of a stroke. Before you leave the hospital, you will be given information about stroke warning signs. Share these with your friends and family members.  Get help right way if you have any symptoms of a stroke. "BE FAST" is an easy way to remember the main warning signs of a stroke. This information is not intended to replace advice given to you by your health care provider. Make sure you discuss any questions you have with your health care provider. Document Revised: 04/27/2020 Document Reviewed: 04/27/2020 Elsevier Patient Education  2021 [place].   Preventing Atrial Fibrillation-Related Stroke Atrial fibrillation (AFib) is a common type of irregular or rapid heartbeat (arrhythmia) that increases the risk for a stroke. In AFib, the top portions of the heart (atria) beat out of sync with the lower portions of the heart. When the muscles of the atria tighten in an uncoordinated way (fibrillating), blood can pool in the heart and form clots. A stroke can be caused by a blood clot that travels to the brain. This type of stroke is preventable. Understanding AFib and knowing how to properly manage it can prevent a stroke from happening. How can this condition affect me? Having AFib can increase your risk for a stroke. A stroke is a medical emergency. It can lead to brain damage and can sometimes be life-threatening. A stroke can be a life-changing event. What can increase my risk? Having AFib can increase your risk of stroke. Other risk factors include:  Having heart failure.  Having high blood pressure.  Being older than age 33.  Having diabetes.  Having a history of vascular disease, such as heart attack or stroke.  Being female.  Having a history of transient ischemic attacks (TIAs). This is sometimes called a ministroke.  Having a family history of stroke. Risk factors that you can change include:  Smoking.  High  cholesterol.  Being inactive (sedentary lifestyle).  Eating a diet that is high in fat, cholesterol, and salt. What actions can I take to prevent this?  Maintain a healthy weight.  Get regular exercise. This can include at least 40 minutes of moderate exercise on most days, such as walking at a fast pace, or vigorous exercise, such as jogging.  Get evaluated for obstructive sleep apnea. Talk to your health care provider about getting a sleep evaluation if you snore a lot or have excessive sleepiness.  Manage any other medical conditions you have, such as hypertension or diabetes. Medicines  Take over-the-counter and prescription medicines only as told by your health care provider.  If your health care provider prescribed an anticoagulant, take it exactly as told. Taking too much blood-thinning medicine can cause bleeding. Taking too little may not give you the protection that you need against stroke and other problems. Eating and drinking  Eat healthy foods, including at least 5 servings of fruits and vegetables a day.  Do not drink alcohol.  Do not drink beverages that have caffeine, such as coffee, soda, and tea.  Follow instructions about your diet as told by your health care provider. Questions to ask your health care provider Contact a health care provider if:  You notice a change in the rate, rhythm, or strength of your heartbeat.  You feel dizzy.  You are taking an anticoagulant and you have more bruises than usual.  You get tired more easily when you exercise or do similar activities. Get help right away if:  You have chest pain.  You have trouble breathing.  You have pain in your abdomen.  You experience unusual sweating or weakness.  You take anticoagulants and you: ? Have severe headaches or confusion. ? Have blood in your vomit, bowel movement, or urine. ? Have bleeding that will not stop. ? Fall or injure your head.  You have any symptoms of a  stroke. "BE FAST" is an easy way to remember the main warning signs of a stroke: ? B - Balance. Signs are dizziness, trouble walking, or loss of balance. ? E - Eyes. Signs are trouble seeing or a sudden change in vision. ? F - Face. Signs are sudden weakness or numbness of the face, or the face or eyelid drooping on one side. ? A - Arms. Signs are weakness or numbness in an arm. This happens suddenly and usually on one side of the body. ? S - Speech. Signs are sudden trouble speaking, slurred speech, or trouble understanding what people say. ? T - Time. Time to call emergency services. Write down what time symptoms started.  You have other signs of a stroke, such as: ? A sudden, severe headache with no known cause. ? Nausea or vomiting. ? Seizure. These symptoms may represent a serious problem that is an emergency. Do not wait to see if the symptoms will go away. Get medical help right away. Call your local emergency services (911 in the [place] Do not drive yourself to the hospital.   Summary  Having atrial fibrillation (AFib) can increase the risk for a stroke. Talk with your health care provider about what symptoms to watch for.  AFib-related stroke is preventable. Proper management of AFib can prevent you from having a stroke.  Talk with your health care provider about whether anticoagulant medicine is right for you.  Learn the warning signs of a stroke and remember "BE FAST." This information is not intended to replace advice  given to you by your health care provider. Make sure you discuss any questions you have with your health care provider. Document Revised: 05/19/2020 Document Reviewed: 05/19/2020 Elsevier Patient Education  2021 [employer].    IMPORTANT INFORMATION: PAY CLOSE ATTENTION   PHYSICIAN DISCHARGE INSTRUCTIONS  Follow with Primary care provider  Caryl Bis, MD  and other consultants as instructed by your Hospitalist Physician  [place] IF SYMPTOMS COME BACK, WORSEN OR NEW PROBLEM DEVELOPS   Please note: You were cared for by a hospitalist during your hospital stay. Every effort will be made to forward records to your primary care provider.  You can request that your primary care provider send for your hospital records if they have not received them.  Once you are discharged, your primary care physician will handle any further medical issues. Please note that NO REFILLS for any discharge medications will be authorized once you are discharged, as it is imperative that you return to your primary care physician (or establish a relationship with a primary care physician if you do not have one) for your post hospital discharge needs so that they can reassess your need for medications and monitor your lab values.  Please get a complete blood count and chemistry panel checked by your Primary MD at your next visit, and again as instructed by your Primary MD.  Get Medicines reviewed and adjusted: Please take all your medications with you for your next visit with your Primary MD  Laboratory/radiological data: Please request your Primary MD to go over all hospital tests and procedure/radiological results at the follow up, please ask your primary care provider to get all Hospital records sent to his/her office.  In some cases, they will be blood work, cultures and biopsy results pending at the time of your discharge. Please request that your primary care provider follow up on these results.  If you are diabetic, please bring your blood sugar readings with you to your follow up appointment with primary care.    Please call and make your follow up appointments as soon as possible.    Also Note the following: If you experience worsening of your admission symptoms, develop shortness of breath, life threatening emergency, suicidal or homicidal thoughts you must seek medical attention immediately by calling 911 or calling your  MD immediately  if symptoms less severe.  You must read complete instructions/literature along with all the possible adverse reactions/side effects for all the Medicines you take and that have been prescribed to you. Take any new Medicines after you have completely understood and accpet all the possible adverse reactions/side effects.   Do not drive when taking Pain medications or sleeping medications (Benzodiazepines)  Do not take more than prescribed Pain, Sleep and Anxiety Medications. It is not advisable to combine anxiety,sleep and pain medications without talking with your primary care practitioner  Special Instructions: If you have smoked or chewed Tobacco  in the last 2 yrs please stop smoking, stop any regular Alcohol  and or any Recreational drug use.  Wear Seat belts while driving.  Do not drive if taking any narcotic, mind altering or controlled substances or recreational drugs or alcohol.

## 2021-02-17 NOTE — Care Management Important Message (Signed)
Important Message  Patient Details  Name: Debbie Rubio MRN: [id number] Date of Birth: 1940/10/23   Medicare Important Message Given:  Yes     Tommy Medal 02/17/2021, 12:05 PM

## 2021-02-17 NOTE — Evaluation (Signed)
Speech Language Pathology Evaluation Patient Details Name: Debbie Rubio MRN: [id number] DOB: [birth year] Today's Date: 02/17/2021 Time: 5916-3846 SLP Time Calculation (min) (ACUTE ONLY): 30 min  Problem List:  Patient Active Problem List   Diagnosis Date Noted  . Stroke ([place]) 02/15/2021  . Abnormal uterine bleeding (AUB) 02/04/2018  . Influenza B 12/02/2015  . Febrile respiratory illness 12/02/2015  . Elevated troponin 12/02/2015  . AKI (acute kidney injury) ([place]) 12/02/2015  . Hypokalemia 12/02/2015  . CAP (community acquired pneumonia)   . TINNITUS, CHRONIC 08/20/2008  . Hypothyroidism 08/04/2008  . ATRIAL FIBRILLATION, PAROXYSMAL 08/04/2008   Past Medical History:  Past Medical History:  Diagnosis Date  . Anemia    YEARS AGO EARLY 20'S  . Arthritis    BILATERAL HANDS,KNEES  . Back spasm    LEFT LOWER BACK  . Biatrial enlargement   . Bronchitis    22 YEARS AGO, IF SHE GETS A COLD IT TURNS INTO BRONCHITIS - USES INHALER  . Dyspnea    with exercise  . GERD (gastroesophageal reflux disease)    NOT RECENTLY-NO MEDS  . H/O: hypothyroidism   . Hypertension   . Hypothyroidism   . Paroxysmal atrial fibrillation (HCC)   . Pneumonia 11/2015   only time every had pneumonia  . SVD (spontaneous vaginal delivery)    x 3  . Thyroid disease   . Urinary tract infection   . Wears partial dentures    upper   Past Surgical History:  Past Surgical History:  Procedure Laterality Date  . DILATION AND CURETTAGE OF UTERUS    . EYE SURGERY     BURNED BILATERAL TEAR DUCTS  . HYSTEROSCOPY WITH D & C N/A 03/03/2018   Procedure: DILATATION AND CURETTAGE /HYSTEROSCOPY;  Surgeon: Molli Posey, MD;  Location: [facility];  Service: Gynecology;  Laterality: N/A;  patient was originally on 6/3, but was sick  . MULTIPLE TOOTH EXTRACTIONS     upper teeth   HPI:  Debbie Rubio  is a 80 y.o. female, past medical history of atrial fibrillation, only on aspirin, hypothyroidism,  hypertension, she presents to ED with left-sided facial droop, left eyelid ptosis, slurred speech, and visual deficits on the left, reports she fell in the bathroom today, struck the right side of her head, she is herself unaware of these deficits, she thinks she woke up at her usual health, reports she fell in the bathroom in the morning unsure 7 or 9 AM, reports she was not able to stand up, family tried to reach her, when they were unable, son went to see her, found her on the floor in the afternoon, where she was brought by EMS, she denies fever, chills, chest pain, nausea, vomiting or diarrhea.  - in ED CT head significant for acute CVA, areas of low-density in the right cerebral hemisphere compatible with cytotoxic edema and recent infarct, no evidence of acute hemorrhage, [facility]  hospitalist consulted to admit. MRI shows Several small to moderate size acute right MCA territory infarcts.  Minimal petechial hemorrhage in the temporal lobe. Pt failed the [place]. BSE/SLE ordered.   Assessment / Plan / Recommendation Clinical Impression  Speech language evaluation completed by means of the the [place] Examination; Pt presents with mild dysarthria and based on a score of 23/30 Pt also presents with mild cognitive impairment. Pt is alert and oriented X4, and was very tired this am at the time of the evaluation which questionably negatively impacted her score. Pt demonstrated mild impairment in  the area of short term memory with story recall, however she recalled all 5 words with a brief delay. Other areas of need noted were generational naming and thought organization. Pt will benefit from [facility] SLP for a more in depth cognitive assessment and treatment as indicated; as BSE reports yesterday she will also benefit from [facility] SLP for dysphagia. Pt does live alone, reports she balances her check book, organizes her medications and still drives; SLP cautioned Pt that she may need support for these things, even if just  temporarily; Pt was receptive to this. ST will continue to follow acutely.    SLP Assessment  SLP Recommendation/Assessment: Patient needs continued Speech Lanaguage Pathology Services SLP Visit Diagnosis: Dysphagia, unspecified (R13.10);Dysarthria and anarthria (R47.1);Cognitive communication deficit (R41.841)    Follow Up Recommendations  Home health SLP    Frequency and Duration min 2x/week  1 week      SLP Evaluation Cognition  Overall Cognitive Status: No family/caregiver present to determine baseline cognitive functioning Arousal/Alertness: Lethargic Orientation Level: Oriented X4 Attention: Focused Focused Attention: Appears intact Memory: Impaired Memory Impairment: Decreased short term memory Decreased Short Term Memory: Verbal basic Awareness: Appears intact Problem Solving: Appears intact Executive Function: Organizing;Reasoning Reasoning: Impaired Organizing: Impaired       Comprehension  Auditory Comprehension Overall Auditory Comprehension: Appears within functional limits for tasks assessed    Expression Expression Primary Mode of Expression: Verbal Verbal Expression Overall Verbal Expression: Appears within functional limits for tasks assessed Written Expression Dominant Hand: Left   Oral / Motor  Oral Motor/Sensory Function Overall Oral Motor/Sensory Function: Moderate impairment Facial ROM: Reduced left;Suspected CN VII (facial) dysfunction Facial Symmetry: Abnormal symmetry left;Suspected CN VII (facial) dysfunction Facial Strength: Reduced left;Suspected CN VII (facial) dysfunction Motor Speech Overall Motor Speech: Appears within functional limits for tasks assessed     Michale Emmerich H. Roddie Mc, CCC-SLP Speech Language Pathologist  Wende Bushy 02/17/2021, 11:29 AM

## 2021-02-21 DIAGNOSIS — Z6828 Body mass index (BMI) 28.0-28.9, adult: Secondary | ICD-10-CM | POA: Diagnosis not present

## 2021-02-21 DIAGNOSIS — G8194 Hemiplegia, unspecified affecting left nondominant side: Secondary | ICD-10-CM | POA: Diagnosis not present

## 2021-02-21 DIAGNOSIS — I4821 Permanent atrial fibrillation: Secondary | ICD-10-CM | POA: Diagnosis not present

## 2021-02-21 DIAGNOSIS — R300 Dysuria: Secondary | ICD-10-CM | POA: Diagnosis not present

## 2021-02-25 ENCOUNTER — Encounter (HOSPITAL_COMMUNITY): Payer: Self-pay | Admitting: Emergency Medicine

## 2021-02-25 ENCOUNTER — Emergency Department (HOSPITAL_COMMUNITY)
Admission: EM | Admit: 2021-02-25 | Discharge: 2021-02-26 | Disposition: A | Discharge status: Home / Self Care | Payer: PPO | Attending: Emergency Medicine | Admitting: Emergency Medicine

## 2021-02-25 ENCOUNTER — Other Ambulatory Visit: Payer: Self-pay

## 2021-02-25 ENCOUNTER — Emergency Department (HOSPITAL_COMMUNITY): Payer: PPO

## 2021-02-25 DIAGNOSIS — I69354 Hemiplegia and hemiparesis following cerebral infarction affecting left non-dominant side: Secondary | ICD-10-CM | POA: Diagnosis not present

## 2021-02-25 DIAGNOSIS — I69392 Facial weakness following cerebral infarction: Secondary | ICD-10-CM | POA: Diagnosis not present

## 2021-02-25 DIAGNOSIS — I69328 Other speech and language deficits following cerebral infarction: Secondary | ICD-10-CM | POA: Diagnosis not present

## 2021-02-25 DIAGNOSIS — Z7901 Long term (current) use of anticoagulants: Secondary | ICD-10-CM | POA: Insufficient documentation

## 2021-02-25 DIAGNOSIS — Z79899 Other long term (current) drug therapy: Secondary | ICD-10-CM | POA: Insufficient documentation

## 2021-02-25 DIAGNOSIS — M170 Bilateral primary osteoarthritis of knee: Secondary | ICD-10-CM | POA: Diagnosis not present

## 2021-02-25 DIAGNOSIS — E039 Hypothyroidism, unspecified: Secondary | ICD-10-CM | POA: Insufficient documentation

## 2021-02-25 DIAGNOSIS — E876 Hypokalemia: Secondary | ICD-10-CM | POA: Insufficient documentation

## 2021-02-25 DIAGNOSIS — I10 Essential (primary) hypertension: Secondary | ICD-10-CM | POA: Insufficient documentation

## 2021-02-25 DIAGNOSIS — N939 Abnormal uterine and vaginal bleeding, unspecified: Secondary | ICD-10-CM | POA: Insufficient documentation

## 2021-02-25 DIAGNOSIS — Z87891 Personal history of nicotine dependence: Secondary | ICD-10-CM | POA: Diagnosis not present

## 2021-02-25 DIAGNOSIS — K219 Gastro-esophageal reflux disease without esophagitis: Secondary | ICD-10-CM | POA: Diagnosis not present

## 2021-02-25 DIAGNOSIS — K5730 Diverticulosis of large intestine without perforation or abscess without bleeding: Secondary | ICD-10-CM | POA: Diagnosis not present

## 2021-02-25 DIAGNOSIS — E785 Hyperlipidemia, unspecified: Secondary | ICD-10-CM | POA: Diagnosis not present

## 2021-02-25 DIAGNOSIS — R1310 Dysphagia, unspecified: Secondary | ICD-10-CM | POA: Diagnosis not present

## 2021-02-25 DIAGNOSIS — R1030 Lower abdominal pain, unspecified: Secondary | ICD-10-CM | POA: Insufficient documentation

## 2021-02-25 DIAGNOSIS — J40 Bronchitis, not specified as acute or chronic: Secondary | ICD-10-CM | POA: Diagnosis not present

## 2021-02-25 DIAGNOSIS — M19042 Primary osteoarthritis, left hand: Secondary | ICD-10-CM | POA: Diagnosis not present

## 2021-02-25 DIAGNOSIS — M19041 Primary osteoarthritis, right hand: Secondary | ICD-10-CM | POA: Diagnosis not present

## 2021-02-25 DIAGNOSIS — I69312 Visuospatial deficit and spatial neglect following cerebral infarction: Secondary | ICD-10-CM | POA: Diagnosis not present

## 2021-02-25 DIAGNOSIS — I480 Paroxysmal atrial fibrillation: Secondary | ICD-10-CM | POA: Insufficient documentation

## 2021-02-25 DIAGNOSIS — K449 Diaphragmatic hernia without obstruction or gangrene: Secondary | ICD-10-CM | POA: Diagnosis not present

## 2021-02-25 DIAGNOSIS — D3501 Benign neoplasm of right adrenal gland: Secondary | ICD-10-CM | POA: Diagnosis not present

## 2021-02-25 LAB — TYPE AND SCREEN
ABO/RH(D): AB POS
Antibody Screen: NEGATIVE

## 2021-02-25 LAB — URINALYSIS, ROUTINE W REFLEX MICROSCOPIC
Bacteria, UA: NONE SEEN
Bilirubin Urine: NEGATIVE
Glucose, UA: NEGATIVE mg/dL
Ketones, ur: NEGATIVE mg/dL
Leukocytes,Ua: NEGATIVE
Nitrite: NEGATIVE
Protein, ur: NEGATIVE mg/dL
RBC / HPF: >50 RBC/hpf — ABNORMAL HIGH (ref 0–5)
Specific Gravity, Urine: 1.018 (ref 1.005–1.030)
pH: 5 (ref 5.0–8.0)

## 2021-02-25 LAB — COMPREHENSIVE METABOLIC PANEL
ALT: 24 U/L (ref 0–44)
AST: 25 U/L (ref 15–41)
Albumin: 3.7 g/dL (ref 3.5–5.0)
Alkaline Phosphatase: 69 U/L (ref 38–126)
Anion gap: 10 (ref 5–15)
BUN: 27 mg/dL — ABNORMAL HIGH (ref 8–23)
CO2: 27 mmol/L (ref 22–32)
Calcium: 9.4 mg/dL (ref 8.9–10.3)
Chloride: 100 mmol/L (ref 98–111)
Creatinine, Ser: 1.25 mg/dL — ABNORMAL HIGH (ref 0.44–1.00)
GFR, Estimated: 44 mL/min — ABNORMAL LOW (ref >60)
Glucose, Bld: 113 mg/dL — ABNORMAL HIGH (ref 70–99)
Potassium: 2.9 mmol/L — ABNORMAL LOW (ref 3.5–5.1)
Sodium: 137 mmol/L (ref 135–145)
Total Bilirubin: 2.1 mg/dL — ABNORMAL HIGH (ref 0.3–1.2)
Total Protein: 7.7 g/dL (ref 6.5–8.1)

## 2021-02-25 LAB — CBC WITH DIFFERENTIAL/PLATELET
Abs Immature Granulocytes: 0.04 10*3/uL (ref 0.00–0.07)
Basophils Absolute: 0 10*3/uL (ref 0.0–0.1)
Basophils Relative: 0 %
Eosinophils Absolute: 0.1 10*3/uL (ref 0.0–0.5)
Eosinophils Relative: 1 %
HCT: 46.2 % — ABNORMAL HIGH (ref 36.0–46.0)
Hemoglobin: 14.5 g/dL (ref 12.0–15.0)
Immature Granulocytes: 0 %
Lymphocytes Relative: 19 %
Lymphs Abs: 1.7 10*3/uL (ref 0.7–4.0)
MCH: 26.8 pg (ref 26.0–34.0)
MCHC: 31.4 g/dL (ref 30.0–36.0)
MCV: 85.2 fL (ref 80.0–100.0)
Monocytes Absolute: 0.7 10*3/uL (ref 0.1–1.0)
Monocytes Relative: 7 %
Neutro Abs: 6.6 10*3/uL (ref 1.7–7.7)
Neutrophils Relative %: 73 %
Platelets: 288 10*3/uL (ref 150–400)
RBC: 5.42 MIL/uL — ABNORMAL HIGH (ref 3.87–5.11)
RDW: 14.6 % (ref 11.5–15.5)
WBC: 9.1 10*3/uL (ref 4.0–10.5)
nRBC: 0 % (ref 0.0–0.2)

## 2021-02-25 NOTE — ED Triage Notes (Signed)
Pt reports she had a stroke and fell 10 days ago, ever since then she has had vaginal bleeding, lower abdominal pain 2/10.  Pt is on blood thinners

## 2021-02-25 NOTE — ED Provider Notes (Addendum)
Emergency Medicine Provider Triage Evaluation Note  Debbie Rubio , a 80 y.o. female  was evaluated in triage.  Pt complains of vaginal bleeding x 10 days. Patient takes eliquis. Abdominal pain she describes as dull aches in lower abdomen. Goes through 4 pads per day. Admits to passing blood clots . She was in the hospital for stroke on may 25 and discharged on may 27.   Review of Systems  Positive: Vaginal bleeding, abdominal pain Negative: Fever, chills, nausea, emesis  Physical Exam  BP 135/77 (BP Location: Right Arm)   Pulse 67   Temp 98.5 F (36.9 C) (Oral)   Resp 16   SpO2 99%  Gen:   Awake, no distress   Resp:  Normal effort  MSK:   Moves extremities without difficulty  Other:  Mild suprapubic tenderness  Medical Decision Making  Medically screening exam initiated at 7:46 PM.  Appropriate orders placed.  Debbie Rubio was informed that the remainder of the evaluation will be completed by another provider, this initial triage assessment does not replace that evaluation, and the importance of remaining in the ED until their evaluation is complete.   Basic labs, type and screen, as well as UA ordered. CT AP ordered.   Portions of this note were generated with [employer]. Dictation errors may occur despite best attempts at proofreading.    Barrie Folk, PA-C 02/25/21 1951    Barrie Folk, PA-C 02/25/21 1953    Ripley Fraise, MD 02/26/21 [phone]

## 2021-02-26 LAB — CBC
HCT: 43.4 % (ref 36.0–46.0)
Hemoglobin: 14 g/dL (ref 12.0–15.0)
MCH: 27.1 pg (ref 26.0–34.0)
MCHC: 32.3 g/dL (ref 30.0–36.0)
MCV: 83.9 fL (ref 80.0–100.0)
Platelets: 249 10*3/uL (ref 150–400)
RBC: 5.17 MIL/uL — ABNORMAL HIGH (ref 3.87–5.11)
RDW: 14.5 % (ref 11.5–15.5)
WBC: 8.6 10*3/uL (ref 4.0–10.5)
nRBC: 0 % (ref 0.0–0.2)

## 2021-02-26 MED ORDER — POTASSIUM CHLORIDE CRYS ER 20 MEQ PO TBCR
40.0000 meq | EXTENDED_RELEASE_TABLET | Freq: Once | ORAL | Status: AC
  Administered 2021-02-26: 40 meq via ORAL
  Filled 2021-02-26: qty 2

## 2021-02-26 MED ORDER — POTASSIUM CHLORIDE CRYS ER 10 MEQ PO TBCR
EXTENDED_RELEASE_TABLET | ORAL | Status: AC
  Filled 2021-02-26: qty 2

## 2021-02-26 MED ORDER — FERROUS SULFATE 325 (65 FE) MG PO TABS: 325.0000 mg | ORAL_TABLET | Freq: Every day | ORAL | 0 refills | Status: DC

## 2021-02-26 NOTE — ED Provider Notes (Signed)
[facility] Provider Note   CSN: [id number] Arrival date & time: 02/25/21  1738     History Chief Complaint  Patient presents with  . Vaginal Bleeding    Debbie Rubio is a 80 y.o. female.  The history is provided by the patient.  Vaginal Bleeding Quality:  Bright red Severity:  Moderate Onset quality:  Gradual Timing:  Intermittent Progression:  Worsening Chronicity:  New Relieved by:  Nothing Worsened by:  Nothing Associated symptoms: no abdominal pain, no dizziness and no fever   Patient with extensive history including recent stroke and started on anticoagulation presents with vaginal bleeding.  Patient reports she has she had some bleeding while in the hospital, but over the past day she has had increasing bleeding and soaked up to 4 pads.  No significant abdominal pain.  No significant weakness.     Past Medical History:  Diagnosis Date  . Anemia    YEARS AGO EARLY 20'S  . Arthritis    BILATERAL HANDS,KNEES  . Back spasm    LEFT LOWER BACK  . Biatrial enlargement   . Bronchitis    22 YEARS AGO, IF SHE GETS A COLD IT TURNS INTO BRONCHITIS - USES INHALER  . Dyspnea    with exercise  . GERD (gastroesophageal reflux disease)    NOT RECENTLY-NO MEDS  . H/O: hypothyroidism   . Hypertension   . Hypothyroidism   . Paroxysmal atrial fibrillation (HCC)   . Pneumonia 11/2015   only time every had pneumonia  . SVD (spontaneous vaginal delivery)    x 3  . Thyroid disease   . Urinary tract infection   . Wears partial dentures    upper    Patient Active Problem List   Diagnosis Date Noted  . Stroke ([place]) 02/15/2021  . Abnormal uterine bleeding (AUB) 02/04/2018  . Influenza B 12/02/2015  . Febrile respiratory illness 12/02/2015  . Elevated troponin 12/02/2015  . AKI (acute kidney injury) ([place]) 12/02/2015  . Hypokalemia 12/02/2015  . CAP (community acquired pneumonia)   . TINNITUS, CHRONIC 08/20/2008  .  Hypothyroidism 08/04/2008  . ATRIAL FIBRILLATION, PAROXYSMAL 08/04/2008    Past Surgical History:  Procedure Laterality Date  . DILATION AND CURETTAGE OF UTERUS    . EYE SURGERY     BURNED BILATERAL TEAR DUCTS  . HYSTEROSCOPY WITH D & C N/A 03/03/2018   Procedure: DILATATION AND CURETTAGE /HYSTEROSCOPY;  Surgeon: Molli Posey, MD;  Location: [facility];  Service: Gynecology;  Laterality: N/A;  patient was originally on 6/3, but was sick  . MULTIPLE TOOTH EXTRACTIONS     upper teeth     OB History   No obstetric history on file.     Family History  Problem Relation Age of Onset  . Heart attack Father   . Heart disease Mother     Social History   Tobacco Use  . Smoking status: Former Smoker    Packs/day: 0.75    Years: 28.00    Pack years: 21.00    Types: Cigarettes    Quit date: 1988    Years since quitting: 34.4  . Smokeless tobacco: Never Used  Vaping Use  . Vaping Use: Never used  Substance Use Topics  . Alcohol use: No  . Drug use: No    Home Medications Prior to Admission medications   Medication Sig Start Date End Date Taking? Authorizing Provider  ferrous sulfate 325 (65 FE) MG tablet Take 1 tablet (325 mg  total) by mouth daily. 02/26/21  Yes Ripley Fraise, MD  albuterol (PROVENTIL HFA;VENTOLIN HFA) 108 (90 Base) MCG/ACT inhaler Inhale 1-2 puffs into the lungs every 6 (six) hours as needed for wheezing or shortness of breath.    [provider]  apixaban (ELIQUIS) 5 MG TABS tablet Take 1 tablet (5 mg total) by mouth 2 (two) times daily. 02/24/21   Johnson, Clanford L, MD  atorvastatin (LIPITOR) 20 MG tablet Take 1 tablet (20 mg total) by mouth every evening. 02/17/21   Johnson, Clanford L, MD  digoxin (LANOXIN) 0.25 MG tablet Take 250 mcg by mouth daily.    [provider]  indapamide (LOZOL) 2.5 MG tablet Take 2.5 mg by mouth See admin instructions. Take 1/2 tablet daily 12/06/20   [provider]  levothyroxine (SYNTHROID) 137 MCG  tablet Take 137 mcg by mouth daily. 01/10/21   [provider]  losartan (COZAAR) 100 MG tablet Take 100 mg by mouth daily.    [provider]  pantoprazole (PROTONIX) 40 MG tablet Take 1 tablet (40 mg total) by mouth daily with breakfast. 02/18/21 02/18/22  Johnson, Clanford L, MD  pindolol (VISKEN) 5 MG tablet Take 2.5 mg by mouth daily. One half tablet daily    [provider]  traMADol (ULTRAM) 50 MG tablet Take 25 mg by mouth every 6 (six) hours as needed.    [provider]    Allergies    Beta adrenergic blockers, Flecainide, Levaquin [levofloxacin], Metoprolol succinate, Prednisone, and Pseudoephedrine  Review of Systems   Review of Systems  Constitutional: Negative for fever.  Gastrointestinal: Negative for abdominal pain.  Genitourinary: Positive for vaginal bleeding.  Neurological: Negative for dizziness.  All other systems reviewed and are negative.   Physical Exam Updated Vital Signs BP 115/79 (BP Location: Right Arm)   Pulse 73   Temp 97.6 F (36.4 C) (Oral)   Resp 18   Ht 1.664 m (5' 5.5")   Wt 75.8 kg   SpO2 97%   BMI 27.37 kg/m   Physical Exam  CONSTITUTIONAL: Elderly, no acute distress HEAD: Normocephalic/atraumatic EYES: EOMI/PERRL ENMT: Mucous membranes moist NECK: supple no meningeal signs SPINE/BACK:entire spine nontender CV: S1/S2 noted, no murmurs/rubs/gallops noted LUNGS: Lungs are clear to auscultation bilaterally, no apparent distress ABDOMEN: soft, nontender, no rebound or guarding, bowel sounds noted throughout abdomen GU:no cva tenderness, pelvic exam performed nurse present.  Small amount of blood is oozing from cervix.  No lacerations.  No clots. NEURO: Pt is awake/alert/appropriate, moves all extremitiesx4.  No facial droop.   EXTREMITIES: pulses normal/equal, full ROM SKIN: warm, color normal PSYCH: no abnormalities of mood noted, alert and oriented to situation  ED Results / Procedures / Treatments    Labs (all labs ordered are listed, but only abnormal results are displayed) Labs Reviewed  COMPREHENSIVE METABOLIC PANEL - Abnormal; Notable for the following components:      Result Value   Potassium 2.9 (*)    Glucose, Bld 113 (*)    BUN 27 (*)    Creatinine, Ser 1.25 (*)    Total Bilirubin 2.1 (*)    GFR, Estimated 44 (*)    All other components within normal limits  CBC WITH DIFFERENTIAL/PLATELET - Abnormal; Notable for the following components:   RBC 5.42 (*)    HCT 46.2 (*)    All other components within normal limits  URINALYSIS, ROUTINE W REFLEX MICROSCOPIC - Abnormal; Notable for the following components:   APPearance HAZY (*)  Hgb urine dipstick LARGE (*)    RBC / HPF >50 (*)    All other components within normal limits  CBC - Abnormal; Notable for the following components:   RBC 5.17 (*)    All other components within normal limits  URINE CULTURE  TYPE AND SCREEN    EKG None  Radiology CT ABDOMEN PELVIS WO CONTRAST  Result Date: 02/25/2021 CLINICAL DATA:  Vaginal bleeding and lower abdominal pain. EXAM: CT ABDOMEN AND PELVIS WITHOUT CONTRAST TECHNIQUE: Multidetector CT imaging of the abdomen and pelvis was performed following the standard protocol without IV contrast. COMPARISON:  Jan 26, 2018 FINDINGS: Lower chest: No acute abnormality. Hepatobiliary: No focal liver abnormality is seen. No gallstones, gallbladder wall thickening, or biliary dilatation. Pancreas: Unremarkable. No pancreatic ductal dilatation or surrounding inflammatory changes. Spleen: Normal in size without focal abnormality. Adrenals/Urinary Tract: A predominant stable 2.0 cm x 1.2 cm low-attenuation right adrenal mass is seen. The left adrenal gland is normal in appearance. Kidneys are normal, without renal calculi, focal lesion, or hydronephrosis. The urinary bladder is contracted and subsequently limited in evaluation. Stomach/Bowel: There is a small hiatal hernia. Appendix appears normal. No  evidence of bowel wall thickening, distention, or inflammatory changes. Noninflamed diverticula are seen within the mid to distal sigmoid colon. Vascular/Lymphatic: Aortic atherosclerosis. No enlarged abdominal or pelvic lymph nodes. Reproductive: The uterus is normal in size. The endometrium appears to be thickened and is heterogeneous in appearance. This is present on the prior study. The bilateral adnexa are unremarkable. Other: Mild subcutaneous inflammatory fat stranding is seen along the posterior lateral aspect of the left hip. No abdominopelvic ascites. Musculoskeletal: Degenerative changes seen within the lumbar spine. IMPRESSION: 1. Sigmoid diverticulosis. 2. Right adrenal adenoma. 3. Heterogeneous, thickened endometrium which is seen on the prior study. Correlation with pelvic ultrasound is recommended, as an underlying neoplastic process cannot be excluded. Electronically Signed   By: Virgina Norfolk M.D.   On: 02/25/2021 23:12    Procedures Procedures   Medications Ordered in ED Medications  potassium chloride SA (KLOR-CON) CR tablet 40 mEq (40 mEq Oral Given 02/26/21 0451)    ED Course  I have reviewed the triage vital signs and the nursing notes.  Pertinent labs  results that were available during my care of the patient were reviewed by me and considered in my medical decision making (see chart for details).    MDM Rules/Calculators/A&P                          5:38 AM Patient presents with abnormal vaginal bleeding with recent onset of anticoagulation.  Currently her hemoglobin is appropriate.  She is stable.  Patient reports has had previous evaluation for uterine cancer and was told she did not have it. I discussed the case with on-call [facility] for [facility] Dr Royston Sinner We discussed the case.  Patient can be discharged home if bleeding is not worsening, and her hemoglobin is stable.  She should be started on iron  she should call the office in 24 hours to have  outpatient work-up including specialized ultrasound and procedures 6:27 AM Patient stable.  No significant change in vital signs upon standing.  Repeat hemoglobin is stable. At this point patient does appear appropriate for discharge home. Son is at bedside we discussed at length her medications.  She had been on Plavix which was then discontinued and started on Eliquis At this point she should continue Eliquis due to  benefits and also since her bleeding is manageable at this time.  We discussed at length strict ER return precautions  Final Clinical Impression(s) / ED Diagnoses Final diagnoses:  Abnormal uterine bleeding  Hypokalemia    Rx / DC Orders ED Discharge Orders         Ordered    ferrous sulfate 325 (65 FE) MG tablet  Daily        02/26/21 0535           Ripley Fraise, MD 02/26/21 [phone]

## 2021-02-26 NOTE — Discharge Instructions (Addendum)
Please call your OB/GYN in 24 hours.  You will need to have a specialized ultrasound and possibly a procedure. If you feel that your bleeding is getting worse, or if you feel weak or dizzy, or if you are passing blood clots larger than a lemon please call 911

## 2021-02-27 LAB — URINE CULTURE

## 2021-03-01 DIAGNOSIS — D509 Iron deficiency anemia, unspecified: Secondary | ICD-10-CM | POA: Diagnosis not present

## 2021-03-01 DIAGNOSIS — N950 Postmenopausal bleeding: Secondary | ICD-10-CM | POA: Diagnosis not present

## 2021-03-02 ENCOUNTER — Telehealth: Payer: Self-pay

## 2021-03-02 NOTE — Telephone Encounter (Signed)
Spoke with patient and scheduled an in-person Palliative Consult for 04/05/21 @ 12:30PM.  COVID screening was negative. No pets in home. Patient lives alone. She does have [place].  Consent obtained; updated Outlook/Netsmart/Team List and Epic.   Patient is aware she may be receiving a call from NP the day before or day of to confirm appointment.

## 2021-03-06 DIAGNOSIS — I480 Paroxysmal atrial fibrillation: Secondary | ICD-10-CM | POA: Diagnosis not present

## 2021-03-06 DIAGNOSIS — J40 Bronchitis, not specified as acute or chronic: Secondary | ICD-10-CM | POA: Diagnosis not present

## 2021-03-06 DIAGNOSIS — I69392 Facial weakness following cerebral infarction: Secondary | ICD-10-CM | POA: Diagnosis not present

## 2021-03-06 DIAGNOSIS — R1310 Dysphagia, unspecified: Secondary | ICD-10-CM | POA: Diagnosis not present

## 2021-03-06 DIAGNOSIS — I69312 Visuospatial deficit and spatial neglect following cerebral infarction: Secondary | ICD-10-CM | POA: Diagnosis not present

## 2021-03-06 DIAGNOSIS — I69328 Other speech and language deficits following cerebral infarction: Secondary | ICD-10-CM | POA: Diagnosis not present

## 2021-03-06 DIAGNOSIS — I69354 Hemiplegia and hemiparesis following cerebral infarction affecting left non-dominant side: Secondary | ICD-10-CM | POA: Diagnosis not present

## 2021-03-06 DIAGNOSIS — I10 Essential (primary) hypertension: Secondary | ICD-10-CM | POA: Diagnosis not present

## 2021-03-09 DIAGNOSIS — M19042 Primary osteoarthritis, left hand: Secondary | ICD-10-CM | POA: Diagnosis not present

## 2021-03-09 DIAGNOSIS — R1310 Dysphagia, unspecified: Secondary | ICD-10-CM | POA: Diagnosis not present

## 2021-03-09 DIAGNOSIS — E785 Hyperlipidemia, unspecified: Secondary | ICD-10-CM | POA: Diagnosis not present

## 2021-03-09 DIAGNOSIS — I69328 Other speech and language deficits following cerebral infarction: Secondary | ICD-10-CM | POA: Diagnosis not present

## 2021-03-09 DIAGNOSIS — I480 Paroxysmal atrial fibrillation: Secondary | ICD-10-CM | POA: Diagnosis not present

## 2021-03-09 DIAGNOSIS — M170 Bilateral primary osteoarthritis of knee: Secondary | ICD-10-CM | POA: Diagnosis not present

## 2021-03-09 DIAGNOSIS — M19041 Primary osteoarthritis, right hand: Secondary | ICD-10-CM | POA: Diagnosis not present

## 2021-03-09 DIAGNOSIS — K219 Gastro-esophageal reflux disease without esophagitis: Secondary | ICD-10-CM | POA: Diagnosis not present

## 2021-03-09 DIAGNOSIS — E039 Hypothyroidism, unspecified: Secondary | ICD-10-CM | POA: Diagnosis not present

## 2021-03-09 DIAGNOSIS — I69392 Facial weakness following cerebral infarction: Secondary | ICD-10-CM | POA: Diagnosis not present

## 2021-03-09 DIAGNOSIS — Z87891 Personal history of nicotine dependence: Secondary | ICD-10-CM | POA: Diagnosis not present

## 2021-03-09 DIAGNOSIS — I10 Essential (primary) hypertension: Secondary | ICD-10-CM | POA: Diagnosis not present

## 2021-03-09 DIAGNOSIS — J40 Bronchitis, not specified as acute or chronic: Secondary | ICD-10-CM | POA: Diagnosis not present

## 2021-03-09 DIAGNOSIS — Z7901 Long term (current) use of anticoagulants: Secondary | ICD-10-CM | POA: Diagnosis not present

## 2021-03-09 DIAGNOSIS — I69312 Visuospatial deficit and spatial neglect following cerebral infarction: Secondary | ICD-10-CM | POA: Diagnosis not present

## 2021-03-09 DIAGNOSIS — I69354 Hemiplegia and hemiparesis following cerebral infarction affecting left non-dominant side: Secondary | ICD-10-CM | POA: Diagnosis not present

## 2021-03-09 NOTE — H&P (Signed)
Debbie Rubio, Debbie Rubio MEDICAL RECORD NO: [id number] DATE OF BIRTH: 1941/01/17 PHYSICIAN: Ralene Bathe. Matthew Saras, MD  History and Physical   DATE OF SURGERY:  03/16/2021  CHIEF COMPLAINT:  Postmenopausal bleeding.  HISTORY OF PRESENT ILLNESS:  A 80 year old postmenopausal patient, last seen here in 2019, at which time D and C showed normal pathology.  She had not had any recurrence of postmenopausal bleeding until a recent stroke prompted her starting on  anticoagulant, specifically Eliquis, and she experienced some vaginal bleeding.  She was seen in the [facility] ED with normal hemoglobin.  CT showed some possible buildup of endometrial tissue.  She has recovered fairly substantially from her stroke, but again stated that all this started after she went on the blood thinners.  Last week in our office, hemoglobin 13.6.  Was able to do a [place] endometrial biopsy that was all benign except for  some old clot.  I discussed with her repeat outpatient D and C, hysteroscopy with possible placement of [place] IUD mainly to thin out the lining to keep her from having any further bleeding concerns while she is on Eliquis.  This procedure reviewed with  her and she presents at this time for D and C, hysteroscopy, and placement of [place] IUD to control her bleeding.  PAST MEDICAL HISTORY:  ALLERGIES:  BETA-BLOCKERS, FLECAINIDE, LEVOFLOXACIN, METOPROLOL, SUDAFED.  CURRENT MEDICATIONS:  Albuterol inhaler, atorvastatin, digoxin, Eliquis, ferrous sulfate, indapamide, levothyroxine, losartan, pantoprazole, pindolol.  Last mammogram was in 09/2019.  Her colonoscopy was last done in 2017.  She did have in the past loop excision for mild dysplasia that was in 2013.  Last Pap in 2018 was normal.  OBSTETRICAL HISTORY:  She is a G4, P2.  FAMILY HISTORY:  Significant for father with MI, mother with heart disease.  SOCIAL HISTORY:  She is widowed, former smoker.  PAST SURGICAL HISTORY:  She has had tear duct  surgery, D and C in 2019, prior LEEP in 2013, cryo of the cervix in 2012.  REVIEW OF SYSTEMS:  Significant for high blood pressure.  She has been under the care of her cardiologist.  Also, history of hypothyroidism, currently on replacement; reflux; sigmoid diverticulosis; her stroke history as noted above.  PHYSICAL EXAMINATION: VITAL SIGNS:  Temperature 98.2, blood pressure 110/70. HEENT:  Unremarkable. NECK:  Supple without masses. LUNGS:  Clear. CARDIOVASCULAR:  Regular rate and rhythm without murmurs, rubs, or gallops. BREASTS:  Without masses. ABDOMEN:  Soft, flat, nontender. PELVIC:  Vulva, vagina, cervix normal except for atrophic changes.  Uterus midposition, normal size.  Adnexa negative. EXTREMITIES:  Unremarkable. NEUROLOGIC:  Unremarkable.  IMPRESSION:  Recurrence of postmenopausal bleeding that started with her anticoagulation recently.  Previous procedure in 2019, normal pathology.  PLAN:  D and C, hysteroscopy.  We will stop her Eliquis 48 hours preop and hold for 24 hours postop. We will plan to place [facility] IUD to thin out the endometrium to make sure she has no further bleeding while she is on anticoagulation.   ROH D: 03/06/2021 2:13:19 pm T: 03/06/2021 3:51:00 pm  JOB: [id number]/ [id number]

## 2021-03-13 NOTE — Progress Notes (Signed)
Reviewing pt chart for pre-op interview for surgery scheduled 03-16-2021 w/ Dr Matthew Saras.  Pt recent had hospital admission 02-15-2021 w/ CVA and put on blood thinners.  Called and left message w/ Stanton Kidney , [place] scheduler for Dr Matthew Saras, that will need to clearance to have surgery and if can stop eliquis.

## 2021-03-14 ENCOUNTER — Encounter (HOSPITAL_BASED_OUTPATIENT_CLINIC_OR_DEPARTMENT_OTHER): Payer: Self-pay | Admitting: Anesthesiology

## 2021-03-17 DIAGNOSIS — Z1389 Encounter for screening for other disorder: Secondary | ICD-10-CM | POA: Diagnosis not present

## 2021-03-17 DIAGNOSIS — I480 Paroxysmal atrial fibrillation: Secondary | ICD-10-CM | POA: Diagnosis not present

## 2021-03-17 DIAGNOSIS — Z0001 Encounter for general adult medical examination with abnormal findings: Secondary | ICD-10-CM | POA: Diagnosis not present

## 2021-03-17 DIAGNOSIS — K2100 Gastro-esophageal reflux disease with esophagitis, without bleeding: Secondary | ICD-10-CM | POA: Diagnosis not present

## 2021-03-17 DIAGNOSIS — E039 Hypothyroidism, unspecified: Secondary | ICD-10-CM | POA: Diagnosis not present

## 2021-03-17 DIAGNOSIS — Z1331 Encounter for screening for depression: Secondary | ICD-10-CM | POA: Diagnosis not present

## 2021-03-17 DIAGNOSIS — F331 Major depressive disorder, recurrent, moderate: Secondary | ICD-10-CM | POA: Diagnosis not present

## 2021-03-17 DIAGNOSIS — M1711 Unilateral primary osteoarthritis, right knee: Secondary | ICD-10-CM | POA: Diagnosis not present

## 2021-03-19 DIAGNOSIS — R300 Dysuria: Secondary | ICD-10-CM | POA: Diagnosis not present

## 2021-03-19 DIAGNOSIS — G8194 Hemiplegia, unspecified affecting left nondominant side: Secondary | ICD-10-CM | POA: Diagnosis not present

## 2021-03-19 DIAGNOSIS — I4821 Permanent atrial fibrillation: Secondary | ICD-10-CM | POA: Diagnosis not present

## 2021-03-21 ENCOUNTER — Encounter (HOSPITAL_BASED_OUTPATIENT_CLINIC_OR_DEPARTMENT_OTHER): Payer: Self-pay | Admitting: Obstetrics and Gynecology

## 2021-03-22 ENCOUNTER — Other Ambulatory Visit: Payer: Self-pay

## 2021-03-22 ENCOUNTER — Encounter (HOSPITAL_BASED_OUTPATIENT_CLINIC_OR_DEPARTMENT_OTHER): Payer: Self-pay | Admitting: Obstetrics and Gynecology

## 2021-03-22 NOTE — Progress Notes (Addendum)
ADDENDUM:   Chart reviewed by anesthesia, Dr Jefm Petty MDA, stated ok to proceed.   Spoke w/ via phone for pre-op interview--- Pt's son, Debbie Rubio, pt has cognitive issues Lab needs dos----   CBC, Istat, t&s            Lab results------ current ekg in epic/ chart COVID test -----patient states asymptomatic no test needed Arrive at ------- 0930 on 0630-2022 NPO after MN NO Solid Food.  Only water from MN until--- 0830 Med rec completed Medications to take morning of surgery ----- NONE Diabetic medication ----- n/a Patient instructed no nail polish to be worn day of surgery Patient instructed to bring photo id and insurance card day of surgery Patient aware to have Driver (ride ) / caregiver   for 24 hours after surgery -- son, Debbie Rubio Patient Special Instructions ----- n/a Pre-Op special Istructions ----- pt will need son in pre-op due her memory issues.  pt has pcp, Dr T. Daniel, clearance for surgery received via fax from Dr Matthew Saras office, placed in chart.  Patient verbalized understanding of instructions that were given at this phone interview. Patient denies shortness of breath, chest pain, fever, cough at this phone interview.    Anesthesia Review:  HTN;  PAF;  CVA acute right mca 02-15-2021 admission in epic;  pt followed by w/ pcp after discharge and has neurologist appt 04-19-2021;  per pt son has mostly of speech back just a slight residual, facial droop/ vision deficit resolved but since cva pt has cognitive deficit and loss of appetite.  Denies chest pain, peripheral swelling, but does get sob w/ stairs/ long distance walk does household chores ok.  PCP: Dr T. Quillian Quince (per pt son 06/ 2022) Chest x-ray : EKG : 02-15-2021 epic Echo : 02-16-2021 epic Stress test: no Cardiac Cath :  no Activity level:  see above Sleep Study/ CPAP : NO Blood Thinner/ Instructions Maryjane Hurter Dose: Eliquis ASA / Instructions/ Last Dose :  no Per pt son was given instruction by pcp to stop 2  days prior to surgery, last dose pm 03-20-2021

## 2021-03-23 ENCOUNTER — Encounter (HOSPITAL_BASED_OUTPATIENT_CLINIC_OR_DEPARTMENT_OTHER): Payer: Self-pay | Admitting: Anesthesiology

## 2021-03-23 ENCOUNTER — Ambulatory Visit: Payer: Self-pay

## 2021-03-23 ENCOUNTER — Ambulatory Visit (HOSPITAL_BASED_OUTPATIENT_CLINIC_OR_DEPARTMENT_OTHER)
Admission: RE | Admit: 2021-03-23 | Discharge: 2021-03-23 | Disposition: A | Discharge status: Home / Self Care | Payer: PPO | Attending: Obstetrics and Gynecology | Admitting: Obstetrics and Gynecology

## 2021-03-23 ENCOUNTER — Encounter (HOSPITAL_BASED_OUTPATIENT_CLINIC_OR_DEPARTMENT_OTHER): Payer: Self-pay | Admitting: Obstetrics and Gynecology

## 2021-03-23 ENCOUNTER — Encounter (HOSPITAL_BASED_OUTPATIENT_CLINIC_OR_DEPARTMENT_OTHER)
Admission: RE | Disposition: A | Discharge status: Home / Self Care | Payer: Self-pay | Source: Home / Self Care | Attending: Obstetrics and Gynecology

## 2021-03-23 DIAGNOSIS — Z881 Allergy status to other antibiotic agents status: Secondary | ICD-10-CM | POA: Diagnosis not present

## 2021-03-23 DIAGNOSIS — Z87891 Personal history of nicotine dependence: Secondary | ICD-10-CM | POA: Insufficient documentation

## 2021-03-23 DIAGNOSIS — N950 Postmenopausal bleeding: Secondary | ICD-10-CM | POA: Diagnosis not present

## 2021-03-23 DIAGNOSIS — N858 Other specified noninflammatory disorders of uterus: Secondary | ICD-10-CM | POA: Diagnosis not present

## 2021-03-23 DIAGNOSIS — Z79899 Other long term (current) drug therapy: Secondary | ICD-10-CM | POA: Insufficient documentation

## 2021-03-23 DIAGNOSIS — Z8673 Personal history of transient ischemic attack (TIA), and cerebral infarction without residual deficits: Secondary | ICD-10-CM | POA: Insufficient documentation

## 2021-03-23 DIAGNOSIS — Z7901 Long term (current) use of anticoagulants: Secondary | ICD-10-CM | POA: Diagnosis not present

## 2021-03-23 DIAGNOSIS — E039 Hypothyroidism, unspecified: Secondary | ICD-10-CM | POA: Diagnosis not present

## 2021-03-23 DIAGNOSIS — Z79890 Hormone replacement therapy (postmenopausal): Secondary | ICD-10-CM | POA: Diagnosis not present

## 2021-03-23 DIAGNOSIS — Z888 Allergy status to other drugs, medicaments and biological substances status: Secondary | ICD-10-CM | POA: Diagnosis not present

## 2021-03-23 DIAGNOSIS — I480 Paroxysmal atrial fibrillation: Secondary | ICD-10-CM | POA: Diagnosis not present

## 2021-03-23 DIAGNOSIS — E876 Hypokalemia: Secondary | ICD-10-CM | POA: Diagnosis not present

## 2021-03-23 DIAGNOSIS — N710 Acute inflammatory disease of uterus: Secondary | ICD-10-CM | POA: Diagnosis not present

## 2021-03-23 DIAGNOSIS — Z30430 Encounter for insertion of intrauterine contraceptive device: Secondary | ICD-10-CM | POA: Diagnosis not present

## 2021-03-23 HISTORY — PX: HYSTEROSCOPY WITH D & C: SHX1775

## 2021-03-23 HISTORY — PX: INTRAUTERINE DEVICE (IUD) INSERTION: SHX5877

## 2021-03-23 HISTORY — DX: Postmenopausal bleeding: N95.0

## 2021-03-23 HISTORY — DX: Iron deficiency anemia, unspecified: D50.9

## 2021-03-23 HISTORY — DX: Presence of dental prosthetic device (complete) (partial): Z97.2

## 2021-03-23 HISTORY — DX: Unspecified osteoarthritis, unspecified site: M19.90

## 2021-03-23 HISTORY — DX: Long term (current) use of anticoagulants: Z79.01

## 2021-03-23 HISTORY — DX: Anorexia: R63.0

## 2021-03-23 LAB — CBC
HCT: 43.2 % (ref 36.0–46.0)
Hemoglobin: 14.5 g/dL (ref 12.0–15.0)
MCH: 27.2 pg (ref 26.0–34.0)
MCHC: 33.6 g/dL (ref 30.0–36.0)
MCV: 81.1 fL (ref 80.0–100.0)
Platelets: 195 10*3/uL (ref 150–400)
RBC: 5.33 MIL/uL — ABNORMAL HIGH (ref 3.87–5.11)
RDW: 14.7 % (ref 11.5–15.5)
WBC: 9 10*3/uL (ref 4.0–10.5)
nRBC: 0 % (ref 0.0–0.2)

## 2021-03-23 LAB — POCT I-STAT, CHEM 8
BUN: 47 mg/dL — ABNORMAL HIGH (ref 8–23)
Calcium, Ion: 1.15 mmol/L (ref 1.15–1.40)
Chloride: 99 mmol/L (ref 98–111)
Creatinine, Ser: 1.6 mg/dL — ABNORMAL HIGH (ref 0.44–1.00)
Glucose, Bld: 117 mg/dL — ABNORMAL HIGH (ref 70–99)
HCT: 43 % (ref 36.0–46.0)
Hemoglobin: 14.6 g/dL (ref 12.0–15.0)
Potassium: 3.1 mmol/L — ABNORMAL LOW (ref 3.5–5.1)
Sodium: 138 mmol/L (ref 135–145)
TCO2: 25 mmol/L (ref 22–32)

## 2021-03-23 LAB — TYPE AND SCREEN
ABO/RH(D): AB POS
Antibody Screen: NEGATIVE

## 2021-03-23 SURGERY — Surgical Case
Anesthesia: *Unknown

## 2021-03-23 SURGERY — DILATATION AND CURETTAGE /HYSTEROSCOPY
Anesthesia: General | Site: Vagina

## 2021-03-23 MED ORDER — LIDOCAINE HCL (PF) 2 % IJ SOLN
INTRAMUSCULAR | Status: AC
  Filled 2021-03-23: qty 5

## 2021-03-23 MED ORDER — FENTANYL CITRATE (PF) 100 MCG/2ML IJ SOLN
INTRAMUSCULAR | Status: AC
  Filled 2021-03-23: qty 2

## 2021-03-23 MED ORDER — ACETAMINOPHEN 500 MG PO TABS
ORAL_TABLET | ORAL | Status: AC
  Filled 2021-03-23: qty 2

## 2021-03-23 MED ORDER — PROPOFOL 10 MG/ML IV BOLUS
INTRAVENOUS | Status: AC
  Filled 2021-03-23: qty 20

## 2021-03-23 MED ORDER — ONDANSETRON HCL 4 MG/2ML IJ SOLN
INTRAMUSCULAR | Status: DC | PRN
  Administered 2021-03-23: 4 mg via INTRAVENOUS

## 2021-03-23 MED ORDER — LACTATED RINGERS IV SOLN: INTRAVENOUS | Status: DC

## 2021-03-23 MED ORDER — EPHEDRINE SULFATE-NACL 50-0.9 MG/10ML-% IV SOSY
PREFILLED_SYRINGE | INTRAVENOUS | Status: DC | PRN
Start: 2021-03-23 — End: 2021-03-23
  Administered 2021-03-23: 15 mg via INTRAVENOUS

## 2021-03-23 MED ORDER — AMISULPRIDE (ANTIEMETIC) 5 MG/2ML IV SOLN: 10.0000 mg | Freq: Once | INTRAVENOUS | Status: DC | PRN

## 2021-03-23 MED ORDER — ATROPINE SULFATE 1 MG/10ML IJ SOSY
PREFILLED_SYRINGE | INTRAMUSCULAR | Status: AC
  Filled 2021-03-23: qty 10

## 2021-03-23 MED ORDER — FENTANYL CITRATE (PF) 100 MCG/2ML IJ SOLN: 25.0000 ug | INTRAMUSCULAR | Status: DC | PRN

## 2021-03-23 MED ORDER — DEXAMETHASONE SODIUM PHOSPHATE 10 MG/ML IJ SOLN
INTRAMUSCULAR | Status: AC
  Filled 2021-03-23: qty 1

## 2021-03-23 MED ORDER — PROPOFOL 10 MG/ML IV BOLUS
INTRAVENOUS | Status: DC | PRN
  Administered 2021-03-23: 100 mg via INTRAVENOUS

## 2021-03-23 MED ORDER — LIDOCAINE HCL 1 % IJ SOLN
INTRAMUSCULAR | Status: DC | PRN
  Administered 2021-03-23: 8 mL

## 2021-03-23 MED ORDER — ONDANSETRON HCL 4 MG/2ML IJ SOLN
INTRAMUSCULAR | Status: AC
  Filled 2021-03-23: qty 2

## 2021-03-23 MED ORDER — SODIUM CHLORIDE 0.9 % IR SOLN
Status: DC | PRN
  Administered 2021-03-23: 3000 mL

## 2021-03-23 MED ORDER — PROMETHAZINE HCL 25 MG/ML IJ SOLN
6.2500 mg | INTRAMUSCULAR | Status: DC | PRN
Start: 2021-03-23 — End: 2021-03-23

## 2021-03-23 MED ORDER — POVIDONE-IODINE 10 % EX SWAB: 2.0000 "application " | Freq: Once | CUTANEOUS | Status: DC

## 2021-03-23 MED ORDER — DEXTROSE 5 % IV SOLN
2.0000 g | INTRAVENOUS | Status: AC
  Administered 2021-03-23: 2 g via INTRAVENOUS
  Filled 2021-03-23: qty 2

## 2021-03-23 MED ORDER — ATROPINE SULFATE 1 MG/ML IJ SOLN
INTRAMUSCULAR | Status: DC | PRN
  Administered 2021-03-23: .1 mg via INTRAVENOUS

## 2021-03-23 MED ORDER — DEXAMETHASONE SODIUM PHOSPHATE 10 MG/ML IJ SOLN
INTRAMUSCULAR | Status: DC | PRN
  Administered 2021-03-23: 10 mg via INTRAVENOUS

## 2021-03-23 MED ORDER — SILVER NITRATE-POT NITRATE 75-25 % EX MISC
CUTANEOUS | Status: DC | PRN
  Administered 2021-03-23: 1

## 2021-03-23 MED ORDER — LIDOCAINE 2% (20 MG/ML) 5 ML SYRINGE
INTRAMUSCULAR | Status: DC | PRN
  Administered 2021-03-23: 60 mg via INTRAVENOUS

## 2021-03-23 MED ORDER — ACETAMINOPHEN 500 MG PO TABS
1000.0000 mg | ORAL_TABLET | Freq: Once | ORAL | Status: AC
  Administered 2021-03-23: 1000 mg via ORAL

## 2021-03-23 MED ORDER — FENTANYL CITRATE (PF) 100 MCG/2ML IJ SOLN
INTRAMUSCULAR | Status: DC | PRN
  Administered 2021-03-23: 50 ug via INTRAVENOUS

## 2021-03-23 MED ORDER — LEVONORGESTREL 19.5 MG IU IUD
INTRAUTERINE_SYSTEM | INTRAUTERINE | Status: AC
  Administered 2021-03-23: 19.5 via INTRAUTERINE
  Filled 2021-03-23: qty 1

## 2021-03-23 SURGICAL SUPPLY — 27 items
BIPOLAR CUTTING LOOP 21FR (ELECTRODE)
CATH ROBINSON RED A/P 16FR (CATHETERS) IMPLANT
CLOTH BEACON ORANGE TIMEOUT ST (SAFETY) ×2 IMPLANT
COUNTER NEEDLE 1200 MAGNETIC (NEEDLE) ×2 IMPLANT
COVER BACK TABLE 60X90IN (DRAPES) ×2 IMPLANT
DILATOR CANAL MILEX (MISCELLANEOUS) IMPLANT
DRAPE HYSTEROSCOPY (MISCELLANEOUS) ×2 IMPLANT
DRAPE SHEET LG 3/4 BI-LAMINATE (DRAPES) ×2 IMPLANT
ELECT REM PT RETURN 9FT ADLT (ELECTROSURGICAL) ×2
ELECTRODE REM PT RTRN 9FT ADLT (ELECTROSURGICAL) ×1 IMPLANT
GLOVE SURG ENC MOIS LTX SZ7 (GLOVE) ×4 IMPLANT
GOWN STRL REUS W/TWL LRG LVL3 (GOWN DISPOSABLE) ×4 IMPLANT
KIT PROCEDURE FLUENT (KITS) ×2 IMPLANT
KIT TURNOVER CYSTO (KITS) ×2 IMPLANT
KYLEENA  IUD ×1 IMPLANT
LEGGING LITHOTOMY PAIR STRL (DRAPES) ×2 IMPLANT
LOOP CUTTING BIPOLAR 21FR (ELECTRODE) IMPLANT
NDL SPNL 22GX3.5 QUINCKE BK (NEEDLE) ×1 IMPLANT
NEEDLE SPNL 22GX3.5 QUINCKE BK (NEEDLE) ×2 IMPLANT
PACK BASIN DAY SURGERY FS (CUSTOM PROCEDURE TRAY) ×2 IMPLANT
PAD OB MATERNITY 4.3X12.25 (PERSONAL CARE ITEMS) ×2 IMPLANT
PAD PREP 24X48 CUFFED NSTRL (MISCELLANEOUS) ×2 IMPLANT
SEAL ROD LENS SCOPE MYOSURE (ABLATOR) ×2 IMPLANT
SYR CONTROL 10ML LL (SYRINGE) ×2 IMPLANT
TOWEL OR 17X26 10 PK STRL BLUE (TOWEL DISPOSABLE) ×4 IMPLANT
TUBE CONNECTING 12X1/4 (SUCTIONS) IMPLANT
WATER STERILE IRR 500ML POUR (IV SOLUTION) ×2 IMPLANT

## 2021-03-23 NOTE — Transfer of Care (Signed)
Immediate Anesthesia Transfer of Care Note  Patient: Debbie Rubio  Procedure(s) Performed: DILATATION AND CURETTAGE /HYSTEROSCOPY (Vagina ) POSSIBLE KYLEENA INTRAUTERINE DEVICE (IUD) INSERTION (Vagina )  Patient Location: PACU  Anesthesia Type:General  Level of Consciousness: drowsy and responds to stimulation  Airway & Oxygen Therapy: Patient Spontanous Breathing  Post-op Assessment: Report given to RN and Post -op Vital signs reviewed and stable  Post vital signs: Reviewed and stable  Last Vitals:  Vitals Value Taken Time  BP 129/70 03/23/21 1217  Temp    Pulse 65 03/23/21 1221  Resp 17 03/23/21 1221  SpO2 98 % 03/23/21 1221  Vitals shown include unvalidated device data.  Last Pain:  Vitals:   03/23/21 0958  TempSrc: Oral  PainSc: 0-No pain      Patients Stated Pain Goal: 2 (14/38/88 7579)  Complications: No notable events documented.

## 2021-03-23 NOTE — Op Note (Signed)
Preop diagnosis postmenopausal bleeding  Postoperative diagnosis: Same  Procedure: Hysteroscopy with D&C, placement of [place] IUD  Surgeon: Matthew Saras  Anesthesia: General  EBL: 50 cc  Procedure and findings:  Patient was taken to the operating room after an adequate level of general anesthesia was obtained, appropriate timeouts were taken before proceeding.  She was prepped and draped and the bladder was bladder was emptied.  EUA revealed the uterus to be mid posterior upper limit of normal size, adnexa negative.  Cervix grasped with a tenaculum, paracervical block was then created by infiltrating at 3 and 9:00 submucosally 5 to 7 cc of 1% plain Xylocaine at each site after negative aspiration.  Cervix was somewhat stenotic small dilators were used to dilate initially to the point where the sound could be placed sounding the uterus to 8cm.  Progressively dilated to 25 Pratt, continuous-flow hysteroscope was inserted.  There was a fair amount of fluffy tissue precluding a good view the scope was removed, small serrated curette was used used to perform curettage, 1 larger piece of tissue perhaps polypoid was removed in toto, the rest of the fragments submitted to pathology together.  The scope was reinserted and the cavity looked clean at that point.  There was minimal bleeding.  Verdia Kuba IUD was placed primarily to help control bleeding or what may be hyperplasia if that is what the pathology shows.  She would not start her Eliquis back for another 24 hours.  She tolerated this well went to recovery room in good condition.  Of note she did receive preoperative antibiotics Dictated with [employer]  Margarette Asal MD

## 2021-03-23 NOTE — Anesthesia Procedure Notes (Signed)
Procedure Name: LMA Insertion Date/Time: 03/23/2021 11:41 AM Performed by: Rogers Blocker, CRNA Pre-anesthesia Checklist: Patient identified, Emergency Drugs available, Suction available and Patient being monitored Patient Re-evaluated:Patient Re-evaluated prior to induction Oxygen Delivery Method: Circle System Utilized Preoxygenation: Pre-oxygenation with 100% oxygen Induction Type: IV induction Ventilation: Mask ventilation without difficulty LMA: LMA inserted LMA Size: 4.0 Number of attempts: 1 Placement Confirmation: positive ETCO2 Tube secured with: Tape Dental Injury: Teeth and Oropharynx as per pre-operative assessment

## 2021-03-23 NOTE — Anesthesia Preprocedure Evaluation (Addendum)
Anesthesia Evaluation  Patient identified by MRN, date of birth, ID band Patient awake    Reviewed: Allergy & Precautions, NPO status , Patient's Chart, lab work & pertinent test results  History of Anesthesia Complications Negative for: history of anesthetic complications  Airway Mallampati: I  TM Distance: >3 FB Neck ROM: Full    Dental  (+) Dental Advisory Given, Edentulous Upper, Edentulous Lower   Pulmonary former smoker,    Pulmonary exam normal        Cardiovascular hypertension, Pt. on medications and Pt. on home beta blockers Normal cardiovascular exam+ dysrhythmias Atrial Fibrillation   Impressions:  - Normal LV wall thickness with LVEF 60-65%. Indeterminate   diastolic function. Mild biatrial enlargement. Mildly thickened   mitral leaflets with mild to moderate mitral regurgitation.   Moderate tricuspid regurgitation with PASP estimated 55 mmHg   Neuro/Psych CVA, Residual Symptoms negative psych ROS   GI/Hepatic negative GI ROS, Neg liver ROS,   Endo/Other  negative endocrine ROS  Renal/GU negative Renal ROS  negative genitourinary   Musculoskeletal  (+) Arthritis , Osteoarthritis,    Abdominal   Peds negative pediatric ROS (+)  Hematology negative hematology ROS (+)   Anesthesia Other Findings   Reproductive/Obstetrics negative OB ROS                            Lab Results  Component Value Date   WBC 8.6 02/26/2021   HGB 14.0 02/26/2021   HCT 43.4 02/26/2021   MCV 83.9 02/26/2021   PLT 249 02/26/2021    Anesthesia Physical  Anesthesia Plan  ASA: 3  Anesthesia Plan: General   Post-op Pain Management:    Induction:   PONV Risk Score and Plan: 3 and Treatment may vary due to age or medical condition, Ondansetron and Dexamethasone  Airway Management Planned: LMA  Additional Equipment:   Intra-op Plan:   Post-operative Plan: Extubation in OR  Informed  Consent: I have reviewed the patients History and Physical, chart, labs and discussed the procedure including the risks, benefits and alternatives for the proposed anesthesia with the patient or authorized representative who has indicated his/her understanding and acceptance.     Dental advisory given  Plan Discussed with: Anesthesiologist  Anesthesia Plan Comments:        Anesthesia Quick Evaluation

## 2021-03-23 NOTE — Anesthesia Postprocedure Evaluation (Signed)
Anesthesia Post Note  Patient: Debbie Rubio  Procedure(s) Performed: DILATATION AND CURETTAGE /HYSTEROSCOPY (Vagina ) POSSIBLE KYLEENA INTRAUTERINE DEVICE (IUD) INSERTION (Vagina )     Patient location during evaluation: PACU Anesthesia Type: General Level of consciousness: sedated Pain management: pain level controlled Vital Signs Assessment: post-procedure vital signs reviewed and stable Respiratory status: spontaneous breathing and respiratory function stable Cardiovascular status: stable Postop Assessment: no apparent nausea or vomiting Anesthetic complications: no   No notable events documented.  Last Vitals:  Vitals:   03/23/21 1245 03/23/21 1304  BP: 121/80 117/67  Pulse: (!) 59 (!) 57  Resp: 19 16  Temp:  (!) 36.1 C  SpO2: 97% 100%    Last Pain:  Vitals:   03/23/21 1245  TempSrc:   PainSc: 0-No pain                 Shandell Jallow DANIEL

## 2021-03-23 NOTE — Discharge Instructions (Signed)
  Post Anesthesia Home Care Instructions  Activity: Get plenty of rest for the remainder of the day. A responsible individual must stay with you for 24 hours following the procedure.  For the next 24 hours, DO NOT: -Drive a car -Paediatric nurse -Drink alcoholic beverages -Take any medication unless instructed by your physician -Make any legal decisions or sign important papers.  Meals: Start with liquid foods such as gelatin or soup. Progress to regular foods as tolerated. Avoid greasy, spicy, heavy foods. If nausea and/or vomiting occur, drink only clear liquids until the nausea and/or vomiting subsides. Call your physician if vomiting continues.  Special Instructions/Symptoms: Your throat may feel dry or sore from the anesthesia or the breathing tube placed in your throat during surgery. If this causes discomfort, gargle with warm salt water. The discomfort should disappear within 24 hours.  May take Tylenol starting at 4 PM as needed for pain.

## 2021-03-23 NOTE — Progress Notes (Signed)
The patient was re-examined with no change in status Hgb 14/ K + is 3.1

## 2021-03-24 ENCOUNTER — Encounter (HOSPITAL_BASED_OUTPATIENT_CLINIC_OR_DEPARTMENT_OTHER): Payer: Self-pay | Admitting: Obstetrics and Gynecology

## 2021-03-24 LAB — SURGICAL PATHOLOGY

## 2021-03-28 MED FILL — Levonorgestrel Releasing IUD 17.5 MCG/DAY (19.5 MG Total): INTRAUTERINE | Qty: 1 | Status: AC

## 2021-04-03 DIAGNOSIS — N950 Postmenopausal bleeding: Secondary | ICD-10-CM | POA: Diagnosis not present

## 2021-04-05 ENCOUNTER — Other Ambulatory Visit: Payer: Self-pay

## 2021-04-05 ENCOUNTER — Other Ambulatory Visit: Payer: PPO | Admitting: Nurse Practitioner

## 2021-04-05 VITALS — BP 132/60 | HR 63 | Resp 18

## 2021-04-05 DIAGNOSIS — F331 Major depressive disorder, recurrent, moderate: Secondary | ICD-10-CM | POA: Diagnosis not present

## 2021-04-05 DIAGNOSIS — Z515 Encounter for palliative care: Secondary | ICD-10-CM

## 2021-04-05 DIAGNOSIS — R630 Anorexia: Secondary | ICD-10-CM | POA: Diagnosis not present

## 2021-04-05 DIAGNOSIS — N950 Postmenopausal bleeding: Secondary | ICD-10-CM | POA: Diagnosis not present

## 2021-04-05 DIAGNOSIS — F329 Major depressive disorder, single episode, unspecified: Secondary | ICD-10-CM | POA: Diagnosis not present

## 2021-04-05 NOTE — Progress Notes (Signed)
[facility] Palliative Care Consult Note Telephone: [phone]  Fax: [phone]    Date of encounter: 04/05/21 PATIENT NAME: Debbie Rubio [address] [place] [address]   [phone] (home)  DOB: Jun 21, 1941 MRN: [id number]  PRIMARY CARE PROVIDER:    Gar Ponto, MD  REFERRING PROVIDER:   Loa Socks, MD [address] [place],  [place] [address] [phone]  RESPONSIBLE PARTY:    Contact Information     Name Relation Home Work Mobile   CATHALINA, BARCIA [phone]  [phone]     I met face to face with patient in home. Her son Olivia Mackie and her sister present during.  Palliative Care was asked to follow this patient by consultation request of  [place], Day G, MD to address advance care planning and complex medical decision making. This is the initial visit.                                    ASSESSMENT AND PLAN / RECOMMENDATIONS:   Advance Care Planning/Goals of Care: Goals include to maximize quality of life and symptom management. Our advance care planning conversation included a discussion about:    The value and importance of advance care planning  Experiences with loved ones who have been seriously ill or have died  Exploration of personal, cultural or spiritual beliefs that might influence medical decisions  Exploration of goals of care in the event of a sudden injury or illness   CODE STATUS: Full code Goal of care: Patient goal of care is function. She verbalized desire to get well and return to her prior function before her stroke.  Directive : After discussions on ramifications and implications of code status patient decided to be a full Code. Patient verbalized desire to have full attempt at resuscitation in the event of cardiac or respiratory arrest. She want her children who are her POA to make the decision to stop resuscitation attempt if there is no chance of her survival.  Validation provided.  It was explained to  patient that code status will be regularly reviewed to ensure that it reflects patient wishes.   Palliative care will continue to provide support to patient, family and medical team.  I spent 20 minutes providing this consultation. More than 50% of the time in this consultation was spent in counseling and care coordination. -------------------------------------------------------------------------------------------  Symptom Management/Plan: Poor appetite: Encourage snacking between meals. Offer nutritional supplement such as Ensure between meals. Continue to offer meals from a variety of food groups. Encourage consistent use of prescribed Mirtazapine as it can help stimulate appetite. Depression: Stop Zoloft as recommended by PCP, start Mirtazapine 61m at bed time as prescribed.  Post menopausal bleeding: continue to follow up with Dr. RMolli Posey Denied acute bleeding. Provided general support and encouragement. Questions and concerns were addressed. Patient and family was encouraged to call with questions and/or concerns. Contact phone number for [facility] Palliative care provided.  Follow up Palliative Care Visit: Palliative care will continue to follow for complex medical decision making, advance care planning, and clarification of goals. Return in about 4-6 weeks or prn.  PPS: 60%  HOSPICE ELIGIBILITY/DIAGNOSIS: TBD  Chief Complaint: poor appetite  History obtained from review of EMR, discussion with primary team, and interview with family, facility staff/caregiver and/or Ms. Alter.  HISTORY OF PRESENT ILLNESS:  Debbie Rubio is a 80y.o. year old female with multiple  medical problems significant for paroxysmal Afib, Hypothyroidism, HTN, recent stroke from right middle cerebral artery infarction. Patient with history of postmenopausal bleeding s/p D & C and IUD placement completed on 03/23/2021.  Patient with complaint of decreased oral intake in the context of poor appetite.  Family report meal intake of < 25% and weight loss of about 35lbs in the last 2 months. Decreased oral intake not associated with chewing or swallowing problems. Patient passed swallow eval in the hospital. She is s/p hospitalization from 02/15/2021 to 02/17/2021 for CVA. Patient report sleeping a lot, report feeling sad, denied suicide or homicidal ideation. Of note, patient loss a son earlier this year. She had a Tele health visit with her PCP during today's visit. Her Zoloft was discontinued and was started on Mirtazapine. Patient currently ambulates independently, no report of falls since CVA. She lives a lone in her home, her sons in the interim take turns to care for her, she requires minimal assist with her ADLs. Denied fever, denied chills, denied nausea or vomiting.  CMP     Component Value Date/Time   NA 138 03/23/2021 1011   K 3.1 (L) 03/23/2021 1011   CL 99 03/23/2021 1011   CO2 27 02/25/2021 1958   GLUCOSE 117 (H) 03/23/2021 1011   BUN 47 (H) 03/23/2021 1011   CREATININE 1.60 (H) 03/23/2021 1011   CALCIUM 9.4 02/25/2021 1958   PROT 7.7 02/25/2021 1958   ALBUMIN 3.7 02/25/2021 1958   AST 25 02/25/2021 1958   ALT 24 02/25/2021 1958   ALKPHOS 69 02/25/2021 1958   BILITOT 2.1 (H) 02/25/2021 1958   GFRNONAA 44 (L) 02/25/2021 1958   GFRAA >60 02/14/2018 1315    CBC    Component Value Date/Time   WBC 9.0 03/23/2021 1004   RBC 5.33 (H) 03/23/2021 1004   HGB 14.6 03/23/2021 1011   HCT 43.0 03/23/2021 1011   PLT 195 03/23/2021 1004   MCV 81.1 03/23/2021 1004   MCH 27.2 03/23/2021 1004   MCHC 33.6 03/23/2021 1004   RDW 14.7 03/23/2021 1004   LYMPHSABS 1.7 02/25/2021 1958   MONOABS 0.7 02/25/2021 1958   EOSABS 0.1 02/25/2021 1958   BASOSABS 0.0 02/25/2021 1958    I reviewed available labs, medications, imaging, studies and related documents from the EMR.  Records reviewed and summarized above.   ROS EYES: denies acute vision changes ENMT: denies dysphagia Cardiovascular:  denies chest pain, denies DOE Pulmonary: denies cough, denies increased SOB Abdomen: endorses poor appetite, denies constipation, endorses continence of bowel GU: denies dysuria, endorses continence of urine MSK:  endorsed weakness, no falls reported Skin: denies rashes or wounds Neurological: denies uncontrolled pain, denies insomnia Psych: denied positive mood Heme/lymph/immuno: denies bruises, report some vaginal bleeding  Vitals:   04/05/21 1307  BP: 132/60  Pulse: 63  Resp: 18  SpO2: 97%    Physical Exam: Current and past weights: 131lbs, Ht 65f 5", BMI 21.8kg/m2 General: frail appearing, sitting in chair in NAD EYES: anicteric sclera, no discharge  ENMT: intact hearing, oral mucous membranes moist CV: S1S2, RRR, no LE edema Pulmonary: LCTA, no increased work of breathing, no cough, room air Abdomen:  soft and non tender, no ascites GU: deferred MSK: no sarcopenia, moves all extremities, ambulatory Skin: warm and dry, no rashes or wounds on visible skin Neuro: generalized weakness, no cognitive impairment Psych: non-anxious affect, A and O x 4 Hem/lymph/immuno: no widespread bruising  CURRENT PROBLEM LIST:  Patient Active Problem List   Diagnosis Date  Noted   Stroke ([facility]) 02/15/2021   Abnormal uterine bleeding (AUB) 02/04/2018   Influenza B 12/02/2015   Febrile respiratory illness 12/02/2015   Elevated troponin 12/02/2015   AKI (acute kidney injury) ([facility]) 12/02/2015   Hypokalemia 12/02/2015   CAP (community acquired pneumonia)    TINNITUS, CHRONIC 08/20/2008   Hypothyroidism 08/04/2008   ATRIAL FIBRILLATION, PAROXYSMAL 08/04/2008   PAST MEDICAL HISTORY:  Active Ambulatory Problems    Diagnosis Date Noted   Hypothyroidism 08/04/2008   TINNITUS, CHRONIC 08/20/2008   ATRIAL FIBRILLATION, PAROXYSMAL 08/04/2008   Influenza B 12/02/2015   Febrile respiratory illness 12/02/2015   Elevated troponin 12/02/2015   AKI (acute kidney injury) ([facility]) 12/02/2015    Hypokalemia 12/02/2015   CAP (community acquired pneumonia)    Abnormal uterine bleeding (AUB) 02/04/2018   Stroke ([facility]) 02/15/2021   Resolved Ambulatory Problems    Diagnosis Date Noted   No Resolved Ambulatory Problems   Past Medical History:  Diagnosis Date   Anticoagulated    Back spasm    Cognitive deficit as late effect of cerebrovascular accident (CVA) 01/2021   Dyspnea    GERD (gastroesophageal reflux disease)    History of cerebrovascular accident (CVA) with residual deficit 02/15/2021   Hypertension    IDA (iron deficiency anemia)    Loss of appetite    OA (osteoarthritis)    Paroxysmal atrial fibrillation ([facility])    PMB (postmenopausal bleeding)    Wears dentures    SOCIAL HX:  Social History   Tobacco Use   Smoking status: Former    Packs/day: 0.75    Years: 28.00    Pack years: 21.00    Types: Cigarettes    Quit date: 1988    Years since quitting: 34.5   Smokeless tobacco: Never  Substance Use Topics   Alcohol use: No   FAMILY HX:  Family History  Problem Relation Age of Onset   Heart attack Father    Heart disease Mother      ALLERGIES:  Allergies  Allergen Reactions   Beta Adrenergic Blockers     REACTION: bradycardia and hypotension. Pt reports she is sensitive to the high doses, but she can take the lower doses.   Flecainide Swelling   Levaquin [Levofloxacin] Other (See Comments)    bradycardia   Metoprolol Succinate     REACTION: severe hypotension   Prednisone Other (See Comments)    Pt states she cannot take due to afib   Pseudoephedrine     REACTION: causes tachycardia     PERTINENT MEDICATIONS:  Outpatient Encounter Medications as of 04/05/2021  Medication Sig   albuterol (PROVENTIL HFA;VENTOLIN HFA) 108 (90 Base) MCG/ACT inhaler Inhale 1-2 puffs into the lungs every 6 (six) hours as needed for wheezing or shortness of breath.   apixaban (ELIQUIS) 5 MG TABS tablet Take 1 tablet (5 mg total) by mouth 2 (two) times daily.    atorvastatin (LIPITOR) 20 MG tablet Take 1 tablet (20 mg total) by mouth every evening.   digoxin (LANOXIN) 0.25 MG tablet Take 250 mcg by mouth every evening.   ferrous sulfate 325 (65 FE) MG tablet Take 1 tablet (325 mg total) by mouth daily. (Patient not taking: Reported on 03/22/2021)   indapamide (LOZOL) 2.5 MG tablet Take 2.5 mg by mouth See admin instructions. Take 1/2 tablet daily   levothyroxine (SYNTHROID) 137 MCG tablet Take 137 mcg by mouth every evening.   losartan (COZAAR) 100 MG tablet Take 100 mg by mouth every evening.  pantoprazole (PROTONIX) 40 MG tablet Take 1 tablet (40 mg total) by mouth daily with breakfast. (Patient taking differently: Take 40 mg by mouth every evening.)   pindolol (VISKEN) 5 MG tablet Take 2.5 mg by mouth every evening. One half tablet daily   sertraline (ZOLOFT) 50 MG tablet Take 50 mg by mouth every evening.   No facility-administered encounter medications on file as of 04/05/2021.   Thank you for the opportunity to participate in the care of Ms. Rosene.  The palliative care team will continue to follow. Please call our office at [phone] if we can be of additional assistance.   Jari Favre, DNP, AGPCNP-BC  COVID-19 PATIENT SCREENING TOOL Asked and negative response unless otherwise noted:   Have you had symptoms of covid, tested positive or been in contact with someone with symptoms/positive test in the past 5-10 days?

## 2021-04-12 ENCOUNTER — Encounter (HOSPITAL_COMMUNITY): Payer: Self-pay

## 2021-04-12 ENCOUNTER — Inpatient Hospital Stay (HOSPITAL_COMMUNITY)
Admission: EM | Admit: 2021-04-12 | Discharge: 2021-04-14 | DRG: 918 | Disposition: A | Discharge status: Home Health | Payer: PPO | Attending: Internal Medicine | Admitting: Internal Medicine

## 2021-04-12 ENCOUNTER — Other Ambulatory Visit: Payer: Self-pay

## 2021-04-12 DIAGNOSIS — E876 Hypokalemia: Secondary | ICD-10-CM | POA: Diagnosis present

## 2021-04-12 DIAGNOSIS — E039 Hypothyroidism, unspecified: Secondary | ICD-10-CM | POA: Diagnosis present

## 2021-04-12 DIAGNOSIS — Z888 Allergy status to other drugs, medicaments and biological substances status: Secondary | ICD-10-CM

## 2021-04-12 DIAGNOSIS — E441 Mild protein-calorie malnutrition: Secondary | ICD-10-CM | POA: Diagnosis present

## 2021-04-12 DIAGNOSIS — I5032 Chronic diastolic (congestive) heart failure: Secondary | ICD-10-CM | POA: Diagnosis not present

## 2021-04-12 DIAGNOSIS — I480 Paroxysmal atrial fibrillation: Secondary | ICD-10-CM | POA: Diagnosis present

## 2021-04-12 DIAGNOSIS — I69392 Facial weakness following cerebral infarction: Secondary | ICD-10-CM | POA: Diagnosis not present

## 2021-04-12 DIAGNOSIS — T460X1A Poisoning by cardiac-stimulant glycosides and drugs of similar action, accidental (unintentional), initial encounter: Secondary | ICD-10-CM | POA: Diagnosis not present

## 2021-04-12 DIAGNOSIS — N179 Acute kidney failure, unspecified: Secondary | ICD-10-CM | POA: Diagnosis not present

## 2021-04-12 DIAGNOSIS — M545 Low back pain, unspecified: Secondary | ICD-10-CM | POA: Diagnosis not present

## 2021-04-12 DIAGNOSIS — I4891 Unspecified atrial fibrillation: Secondary | ICD-10-CM | POA: Diagnosis present

## 2021-04-12 DIAGNOSIS — E860 Dehydration: Secondary | ICD-10-CM | POA: Diagnosis present

## 2021-04-12 DIAGNOSIS — Z8249 Family history of ischemic heart disease and other diseases of the circulatory system: Secondary | ICD-10-CM

## 2021-04-12 DIAGNOSIS — Z975 Presence of (intrauterine) contraceptive device: Secondary | ICD-10-CM

## 2021-04-12 DIAGNOSIS — Z79890 Hormone replacement therapy (postmenopausal): Secondary | ICD-10-CM | POA: Diagnosis not present

## 2021-04-12 DIAGNOSIS — R1013 Epigastric pain: Secondary | ICD-10-CM | POA: Diagnosis not present

## 2021-04-12 DIAGNOSIS — I69312 Visuospatial deficit and spatial neglect following cerebral infarction: Secondary | ICD-10-CM

## 2021-04-12 DIAGNOSIS — Z20822 Contact with and (suspected) exposure to covid-19: Secondary | ICD-10-CM | POA: Diagnosis present

## 2021-04-12 DIAGNOSIS — R5381 Other malaise: Secondary | ICD-10-CM | POA: Diagnosis not present

## 2021-04-12 DIAGNOSIS — K219 Gastro-esophageal reflux disease without esophagitis: Secondary | ICD-10-CM | POA: Diagnosis present

## 2021-04-12 DIAGNOSIS — Z87891 Personal history of nicotine dependence: Secondary | ICD-10-CM

## 2021-04-12 DIAGNOSIS — Z7901 Long term (current) use of anticoagulants: Secondary | ICD-10-CM

## 2021-04-12 DIAGNOSIS — R001 Bradycardia, unspecified: Secondary | ICD-10-CM | POA: Diagnosis not present

## 2021-04-12 DIAGNOSIS — R778 Other specified abnormalities of plasma proteins: Secondary | ICD-10-CM | POA: Diagnosis not present

## 2021-04-12 DIAGNOSIS — I69319 Unspecified symptoms and signs involving cognitive functions following cerebral infarction: Secondary | ICD-10-CM | POA: Diagnosis not present

## 2021-04-12 DIAGNOSIS — K5900 Constipation, unspecified: Secondary | ICD-10-CM | POA: Diagnosis not present

## 2021-04-12 DIAGNOSIS — Z881 Allergy status to other antibiotic agents status: Secondary | ICD-10-CM

## 2021-04-12 DIAGNOSIS — I69328 Other speech and language deficits following cerebral infarction: Secondary | ICD-10-CM | POA: Diagnosis not present

## 2021-04-12 DIAGNOSIS — I110 Hypertensive heart disease with heart failure: Secondary | ICD-10-CM | POA: Diagnosis not present

## 2021-04-12 DIAGNOSIS — W19XXXA Unspecified fall, initial encounter: Secondary | ICD-10-CM | POA: Diagnosis present

## 2021-04-12 DIAGNOSIS — Z6824 Body mass index (BMI) 24.0-24.9, adult: Secondary | ICD-10-CM

## 2021-04-12 DIAGNOSIS — T460X1D Poisoning by cardiac-stimulant glycosides and drugs of similar action, accidental (unintentional), subsequent encounter: Secondary | ICD-10-CM | POA: Diagnosis not present

## 2021-04-12 DIAGNOSIS — Z79899 Other long term (current) drug therapy: Secondary | ICD-10-CM

## 2021-04-12 DIAGNOSIS — E038 Other specified hypothyroidism: Secondary | ICD-10-CM | POA: Diagnosis not present

## 2021-04-12 DIAGNOSIS — R1110 Vomiting, unspecified: Secondary | ICD-10-CM | POA: Diagnosis not present

## 2021-04-12 DIAGNOSIS — G9389 Other specified disorders of brain: Secondary | ICD-10-CM | POA: Diagnosis not present

## 2021-04-12 DIAGNOSIS — R296 Repeated falls: Secondary | ICD-10-CM | POA: Diagnosis present

## 2021-04-12 NOTE — ED Triage Notes (Signed)
Pt c/o of a few episodes of emesis, pt states she hasn't been able to eat in the last few days. Also complaining of lower back pain

## 2021-04-13 ENCOUNTER — Emergency Department (HOSPITAL_COMMUNITY): Payer: PPO

## 2021-04-13 DIAGNOSIS — I5032 Chronic diastolic (congestive) heart failure: Secondary | ICD-10-CM | POA: Diagnosis present

## 2021-04-13 DIAGNOSIS — R296 Repeated falls: Secondary | ICD-10-CM | POA: Diagnosis present

## 2021-04-13 DIAGNOSIS — Z79890 Hormone replacement therapy (postmenopausal): Secondary | ICD-10-CM | POA: Diagnosis not present

## 2021-04-13 DIAGNOSIS — Z7901 Long term (current) use of anticoagulants: Secondary | ICD-10-CM | POA: Diagnosis not present

## 2021-04-13 DIAGNOSIS — K219 Gastro-esophageal reflux disease without esophagitis: Secondary | ICD-10-CM | POA: Diagnosis present

## 2021-04-13 DIAGNOSIS — Z87891 Personal history of nicotine dependence: Secondary | ICD-10-CM | POA: Diagnosis not present

## 2021-04-13 DIAGNOSIS — T460X1D Poisoning by cardiac-stimulant glycosides and drugs of similar action, accidental (unintentional), subsequent encounter: Secondary | ICD-10-CM | POA: Diagnosis not present

## 2021-04-13 DIAGNOSIS — E039 Hypothyroidism, unspecified: Secondary | ICD-10-CM | POA: Diagnosis present

## 2021-04-13 DIAGNOSIS — Z20822 Contact with and (suspected) exposure to covid-19: Secondary | ICD-10-CM | POA: Diagnosis present

## 2021-04-13 DIAGNOSIS — R001 Bradycardia, unspecified: Secondary | ICD-10-CM | POA: Diagnosis present

## 2021-04-13 DIAGNOSIS — I69328 Other speech and language deficits following cerebral infarction: Secondary | ICD-10-CM | POA: Diagnosis not present

## 2021-04-13 DIAGNOSIS — N179 Acute kidney failure, unspecified: Secondary | ICD-10-CM | POA: Diagnosis present

## 2021-04-13 DIAGNOSIS — I69312 Visuospatial deficit and spatial neglect following cerebral infarction: Secondary | ICD-10-CM | POA: Diagnosis not present

## 2021-04-13 DIAGNOSIS — R778 Other specified abnormalities of plasma proteins: Secondary | ICD-10-CM | POA: Diagnosis present

## 2021-04-13 DIAGNOSIS — E860 Dehydration: Secondary | ICD-10-CM | POA: Diagnosis present

## 2021-04-13 DIAGNOSIS — E038 Other specified hypothyroidism: Secondary | ICD-10-CM | POA: Diagnosis not present

## 2021-04-13 DIAGNOSIS — E876 Hypokalemia: Secondary | ICD-10-CM | POA: Diagnosis present

## 2021-04-13 DIAGNOSIS — Z8249 Family history of ischemic heart disease and other diseases of the circulatory system: Secondary | ICD-10-CM | POA: Diagnosis not present

## 2021-04-13 DIAGNOSIS — I69319 Unspecified symptoms and signs involving cognitive functions following cerebral infarction: Secondary | ICD-10-CM | POA: Diagnosis not present

## 2021-04-13 DIAGNOSIS — E441 Mild protein-calorie malnutrition: Secondary | ICD-10-CM

## 2021-04-13 DIAGNOSIS — I480 Paroxysmal atrial fibrillation: Secondary | ICD-10-CM | POA: Diagnosis present

## 2021-04-13 DIAGNOSIS — K5900 Constipation, unspecified: Secondary | ICD-10-CM | POA: Diagnosis present

## 2021-04-13 DIAGNOSIS — I69392 Facial weakness following cerebral infarction: Secondary | ICD-10-CM | POA: Diagnosis not present

## 2021-04-13 DIAGNOSIS — W19XXXA Unspecified fall, initial encounter: Secondary | ICD-10-CM | POA: Diagnosis present

## 2021-04-13 DIAGNOSIS — I110 Hypertensive heart disease with heart failure: Secondary | ICD-10-CM | POA: Diagnosis present

## 2021-04-13 DIAGNOSIS — T460X1A Poisoning by cardiac-stimulant glycosides and drugs of similar action, accidental (unintentional), initial encounter: Secondary | ICD-10-CM | POA: Diagnosis present

## 2021-04-13 DIAGNOSIS — Z975 Presence of (intrauterine) contraceptive device: Secondary | ICD-10-CM | POA: Diagnosis not present

## 2021-04-13 LAB — URINALYSIS, ROUTINE W REFLEX MICROSCOPIC
Bilirubin Urine: NEGATIVE
Glucose, UA: NEGATIVE mg/dL
Ketones, ur: NEGATIVE mg/dL
Leukocytes,Ua: NEGATIVE
Nitrite: NEGATIVE
Protein, ur: NEGATIVE mg/dL
Specific Gravity, Urine: 1.013 (ref 1.005–1.030)
pH: 6 (ref 5.0–8.0)

## 2021-04-13 LAB — CBC WITH DIFFERENTIAL/PLATELET
Abs Immature Granulocytes: 0.07 10*3/uL (ref 0.00–0.07)
Basophils Absolute: 0 10*3/uL (ref 0.0–0.1)
Basophils Relative: 0 %
Eosinophils Absolute: 0 10*3/uL (ref 0.0–0.5)
Eosinophils Relative: 0 %
HCT: 42.5 % (ref 36.0–46.0)
Hemoglobin: 14.1 g/dL (ref 12.0–15.0)
Immature Granulocytes: 1 %
Lymphocytes Relative: 15 %
Lymphs Abs: 1.7 10*3/uL (ref 0.7–4.0)
MCH: 27.6 pg (ref 26.0–34.0)
MCHC: 33.2 g/dL (ref 30.0–36.0)
MCV: 83.3 fL (ref 80.0–100.0)
Monocytes Absolute: 1 10*3/uL (ref 0.1–1.0)
Monocytes Relative: 8 %
Neutro Abs: 8.8 10*3/uL — ABNORMAL HIGH (ref 1.7–7.7)
Neutrophils Relative %: 76 %
Platelets: 303 10*3/uL (ref 150–400)
RBC: 5.1 MIL/uL (ref 3.87–5.11)
RDW: 15.9 % — ABNORMAL HIGH (ref 11.5–15.5)
WBC: 11.6 10*3/uL — ABNORMAL HIGH (ref 4.0–10.5)
nRBC: 0 % (ref 0.0–0.2)

## 2021-04-13 LAB — BASIC METABOLIC PANEL
Anion gap: 10 (ref 5–15)
BUN: 59 mg/dL — ABNORMAL HIGH (ref 8–23)
CO2: 22 mmol/L (ref 22–32)
Calcium: 7.7 mg/dL — ABNORMAL LOW (ref 8.9–10.3)
Chloride: 103 mmol/L (ref 98–111)
Creatinine, Ser: 1.66 mg/dL — ABNORMAL HIGH (ref 0.44–1.00)
GFR, Estimated: 31 mL/min — ABNORMAL LOW (ref >60)
Glucose, Bld: 91 mg/dL (ref 70–99)
Potassium: 3.1 mmol/L — ABNORMAL LOW (ref 3.5–5.1)
Sodium: 135 mmol/L (ref 135–145)

## 2021-04-13 LAB — COMPREHENSIVE METABOLIC PANEL
ALT: 22 U/L (ref 0–44)
AST: 26 U/L (ref 15–41)
Albumin: 3.4 g/dL — ABNORMAL LOW (ref 3.5–5.0)
Alkaline Phosphatase: 77 U/L (ref 38–126)
Anion gap: 15 (ref 5–15)
BUN: 62 mg/dL — ABNORMAL HIGH (ref 8–23)
CO2: 23 mmol/L (ref 22–32)
Calcium: 8.6 mg/dL — ABNORMAL LOW (ref 8.9–10.3)
Chloride: 96 mmol/L — ABNORMAL LOW (ref 98–111)
Creatinine, Ser: 2.2 mg/dL — ABNORMAL HIGH (ref 0.44–1.00)
GFR, Estimated: 22 mL/min — ABNORMAL LOW (ref >60)
Glucose, Bld: 126 mg/dL — ABNORMAL HIGH (ref 70–99)
Potassium: 3.6 mmol/L (ref 3.5–5.1)
Sodium: 134 mmol/L — ABNORMAL LOW (ref 135–145)
Total Bilirubin: 1.7 mg/dL — ABNORMAL HIGH (ref 0.3–1.2)
Total Protein: 7.3 g/dL (ref 6.5–8.1)

## 2021-04-13 LAB — CBG MONITORING, ED: Glucose-Capillary: 122 mg/dL — ABNORMAL HIGH (ref 70–99)

## 2021-04-13 LAB — DIGOXIN LEVEL: Digoxin Level: 4.1 ng/mL (ref 0.8–2.0)

## 2021-04-13 LAB — TROPONIN I (HIGH SENSITIVITY)
Troponin I (High Sensitivity): 85 ng/L — ABNORMAL HIGH (ref <18)
Troponin I (High Sensitivity): 90 ng/L — ABNORMAL HIGH (ref <18)

## 2021-04-13 LAB — PROTIME-INR
INR: 1.3 — ABNORMAL HIGH (ref 0.8–1.2)
Prothrombin Time: 15.8 seconds — ABNORMAL HIGH (ref 11.4–15.2)

## 2021-04-13 LAB — RESP PANEL BY RT-PCR (FLU A&B, COVID) ARPGX2
Influenza A by PCR: NEGATIVE
Influenza B by PCR: NEGATIVE
SARS Coronavirus 2 by RT PCR: NEGATIVE

## 2021-04-13 LAB — MRSA NEXT GEN BY PCR, NASAL: MRSA by PCR Next Gen: NOT DETECTED

## 2021-04-13 LAB — LIPASE, BLOOD: Lipase: 68 U/L — ABNORMAL HIGH (ref 11–51)

## 2021-04-13 MED ORDER — SERTRALINE HCL 50 MG PO TABS
50.0000 mg | ORAL_TABLET | Freq: Every evening | ORAL | Status: DC
  Administered 2021-04-13 – 2021-04-14 (×2): 50 mg via ORAL
  Filled 2021-04-13 (×2): qty 1

## 2021-04-13 MED ORDER — OXYCODONE HCL 5 MG PO TABS: 5.0000 mg | ORAL_TABLET | ORAL | Status: DC | PRN

## 2021-04-13 MED ORDER — SODIUM CHLORIDE 0.9 % IV BOLUS
1000.0000 mL | Freq: Once | INTRAVENOUS | Status: AC
  Administered 2021-04-13: 1000 mL via INTRAVENOUS

## 2021-04-13 MED ORDER — ONDANSETRON HCL 4 MG PO TABS: 4.0000 mg | ORAL_TABLET | Freq: Four times a day (QID) | ORAL | Status: DC | PRN

## 2021-04-13 MED ORDER — SODIUM CHLORIDE 0.9 % IV SOLN: INTRAVENOUS | Status: AC

## 2021-04-13 MED ORDER — LORAZEPAM 2 MG/ML IJ SOLN
0.5000 mg | Freq: Once | INTRAMUSCULAR | Status: AC
  Administered 2021-04-13: 0.5 mg via INTRAVENOUS
  Filled 2021-04-13: qty 1

## 2021-04-13 MED ORDER — POTASSIUM CHLORIDE 10 MEQ/100ML IV SOLN
10.0000 meq | INTRAVENOUS | Status: AC
  Administered 2021-04-13 (×2): 10 meq via INTRAVENOUS
  Filled 2021-04-13 (×2): qty 100

## 2021-04-13 MED ORDER — ONDANSETRON HCL 4 MG/2ML IJ SOLN: 4.0000 mg | Freq: Four times a day (QID) | INTRAMUSCULAR | Status: DC | PRN

## 2021-04-13 MED ORDER — ACETAMINOPHEN 325 MG PO TABS: 650.0000 mg | ORAL_TABLET | Freq: Four times a day (QID) | ORAL | Status: DC | PRN

## 2021-04-13 MED ORDER — ATORVASTATIN CALCIUM 20 MG PO TABS
20.0000 mg | ORAL_TABLET | Freq: Every evening | ORAL | Status: DC
  Administered 2021-04-13 – 2021-04-14 (×2): 20 mg via ORAL
  Filled 2021-04-13 (×2): qty 1

## 2021-04-13 MED ORDER — CHLORHEXIDINE GLUCONATE CLOTH 2 % EX PADS
6.0000 | MEDICATED_PAD | Freq: Every day | CUTANEOUS | Status: DC
  Administered 2021-04-14: 6 via TOPICAL

## 2021-04-13 MED ORDER — DIGOXIN IMMUNE FAB 40 MG IV SOLR
INTRAVENOUS | Status: AC
  Filled 2021-04-13: qty 120

## 2021-04-13 MED ORDER — APIXABAN 5 MG PO TABS
5.0000 mg | ORAL_TABLET | Freq: Two times a day (BID) | ORAL | Status: DC
  Administered 2021-04-13 – 2021-04-14 (×3): 5 mg via ORAL
  Filled 2021-04-13 (×3): qty 1

## 2021-04-13 MED ORDER — PANTOPRAZOLE SODIUM 40 MG PO TBEC
40.0000 mg | DELAYED_RELEASE_TABLET | Freq: Every evening | ORAL | Status: DC
  Administered 2021-04-13 – 2021-04-14 (×2): 40 mg via ORAL
  Filled 2021-04-13 (×2): qty 1

## 2021-04-13 MED ORDER — ALBUTEROL SULFATE HFA 108 (90 BASE) MCG/ACT IN AERS: 1.0000 | INHALATION_SPRAY | Freq: Four times a day (QID) | RESPIRATORY_TRACT | Status: DC | PRN

## 2021-04-13 MED ORDER — SODIUM CHLORIDE 0.9 % IV SOLN
3.0000 | Freq: Once | INTRAVENOUS | Status: AC
  Administered 2021-04-13: 3 via INTRAVENOUS
  Filled 2021-04-13: qty 120

## 2021-04-13 MED ORDER — POLYETHYLENE GLYCOL 3350 17 G PO PACK: 17.0000 g | PACK | Freq: Every day | ORAL | Status: DC | PRN

## 2021-04-13 MED ORDER — ACETAMINOPHEN 650 MG RE SUPP: 650.0000 mg | Freq: Four times a day (QID) | RECTAL | Status: DC | PRN

## 2021-04-13 MED ORDER — LEVOTHYROXINE SODIUM 25 MCG PO TABS
137.0000 ug | ORAL_TABLET | Freq: Every evening | ORAL | Status: DC
  Administered 2021-04-13 – 2021-04-14 (×2): 137 ug via ORAL
  Filled 2021-04-13 (×2): qty 1

## 2021-04-13 NOTE — Plan of Care (Signed)
  Problem: Acute Rehab PT Goals(only PT should resolve) Goal: Patient Will Transfer Sit To/From Stand Outcome: Progressing Flowsheets (Taken 04/13/2021 1213) Patient will transfer sit to/from stand: with modified independence Goal: Pt Will Transfer Bed To Chair/Chair To Bed Outcome: Progressing Flowsheets (Taken 04/13/2021 1213) Pt will Transfer Bed to Chair/Chair to Bed: with supervision Goal: Pt Will Ambulate Outcome: Progressing Flowsheets (Taken 04/13/2021 1213) Pt will Ambulate:  75 feet  with supervision  with least restrictive assistive device Goal: Pt Will Go Up/Down Stairs Outcome: Progressing Flowsheets (Taken 04/13/2021 1213) Pt will Go Up / Down Stairs:  6-9 stairs  with rail(s) Goal: Pt/caregiver will Perform Home Exercise Program Outcome: Progressing Flowsheets (Taken 04/13/2021 1213) Pt/caregiver will Perform Home Exercise Program:  For increased strengthening  For improved balance  With Supervision, verbal cues required/provided   Floria Raveling. Hartnett-Rands, MS, PT Per [place] [phone] 04/13/2021

## 2021-04-13 NOTE — ED Provider Notes (Signed)
[facility] Provider Note   CSN: [id number] Arrival date & time: 04/12/21  2332     History Chief Complaint  Patient presents with   Emesis    Debbie Rubio is a 80 y.o. female.  Patient with multiple past medical problems document below most recently had a stroke at the end of May.  Sounds like she has had decreased appetite and p.o. intake since that time.  She has had a couple falls as well.  Disbursement Eliquis but is unclear whether or not she is taking it.  She is also on digoxin for her atrial fibrillation.  Sounds like she has had some vomiting throughout the day today that is progressively worsened.  So she comes in here for further evaluation.  She states that she has some burning pain in her left hip which is similar to pain she had in the past with her back.  She also has pain over her nose from the fall last week.  She has some bruising around her eyes and nose but she states that seems to be improving and not worsening.  She did not think she passed out when she fell.  She has been walking since then without any concern for other bony injuries.  She does live by herself per the son's report.  Patient says that the only medications that she always takes is her thyroid medicine and her digoxin.  She may or may not missed other medications.   Emesis     Past Medical History:  Diagnosis Date   Anticoagulated    eliquis--- managed by pcp   Back spasm    LEFT LOWER BACK   Cognitive deficit as late effect of cerebrovascular accident (CVA) 01/2021   Dyspnea    w/ staris / long distance walking, ok with house chores   GERD (gastroesophageal reflux disease)    History of cerebrovascular accident (CVA) with residual deficit 02/15/2021   admission in epic, dx acute large right mca infarts w/ left facial droop, slurred speech, vision decifit with syncope , no acute hemorrhage;  (03-22-2021 per pt son most of speech resolved only a slight residual, droop  resolved, but continues with cognitive deficet   Hypertension    Hypothyroidism    followed by pcp   IDA (iron deficiency anemia)    Loss of appetite    since CVA 05/ 2022, per pt son no desire to eat   OA (osteoarthritis)    BILATERAL HANDS,KNEES   Paroxysmal atrial fibrillation ([facility])    followed by pcp;  previously followed by dr degent   PMB (postmenopausal bleeding)    Wears dentures    full upper    Patient Active Problem List   Diagnosis Date Noted   Stroke ([facility]) 02/15/2021   Abnormal uterine bleeding (AUB) 02/04/2018   Influenza B 12/02/2015   Febrile respiratory illness 12/02/2015   Elevated troponin 12/02/2015   AKI (acute kidney injury) ([facility]) 12/02/2015   Hypokalemia 12/02/2015   CAP (community acquired pneumonia)    TINNITUS, CHRONIC 08/20/2008   Hypothyroidism 08/04/2008   ATRIAL FIBRILLATION, PAROXYSMAL 08/04/2008    Past Surgical History:  Procedure Laterality Date   DILATION AND CURETTAGE OF UTERUS     EYE SURGERY     BURNED BILATERAL TEAR DUCTS   HYSTEROSCOPY WITH D & C N/A 03/03/2018   Procedure: DILATATION AND CURETTAGE /HYSTEROSCOPY;  Surgeon: Molli Posey, MD;  Location: [facility];  Service: Gynecology;  Laterality: N/A;  patient was originally  on 6/3, but was sick   HYSTEROSCOPY WITH D & C N/A 03/23/2021   Procedure: DILATATION AND CURETTAGE /HYSTEROSCOPY;  Surgeon: Molli Posey, MD;  Location: [facility];  Service: Gynecology;  Laterality: N/A;   INTRAUTERINE DEVICE (IUD) INSERTION N/A 03/23/2021   Procedure: POSSIBLE KYLEENA INTRAUTERINE DEVICE (IUD) INSERTION;  Surgeon: Molli Posey, MD;  Location: [facility];  Service: Gynecology;  Laterality: N/A;   MULTIPLE TOOTH EXTRACTIONS     upper teeth     OB History   No obstetric history on file.     Family History  Problem Relation Age of Onset   Heart attack Father    Heart disease Mother     Social History   Tobacco Use   Smoking status: Former     Packs/day: 0.75    Years: 28.00    Pack years: 21.00    Types: Cigarettes    Quit date: 1988    Years since quitting: 34.5   Smokeless tobacco: Never  Vaping Use   Vaping Use: Never used  Substance Use Topics   Alcohol use: No   Drug use: No    Home Medications Prior to Admission medications   Medication Sig Start Date End Date Taking? Authorizing Provider  albuterol (PROVENTIL HFA;VENTOLIN HFA) 108 (90 Base) MCG/ACT inhaler Inhale 1-2 puffs into the lungs every 6 (six) hours as needed for wheezing or shortness of breath.    [provider]  apixaban (ELIQUIS) 5 MG TABS tablet Take 1 tablet (5 mg total) by mouth 2 (two) times daily. 02/24/21   Johnson, Clanford L, MD  atorvastatin (LIPITOR) 20 MG tablet Take 1 tablet (20 mg total) by mouth every evening. 02/17/21   Johnson, Clanford L, MD  digoxin (LANOXIN) 0.25 MG tablet Take 250 mcg by mouth every evening.    [provider]  ferrous sulfate 325 (65 FE) MG tablet Take 1 tablet (325 mg total) by mouth daily. Patient not taking: Reported on 03/22/2021 02/26/21   Ripley Fraise, MD  indapamide (LOZOL) 2.5 MG tablet Take 2.5 mg by mouth See admin instructions. Take 1/2 tablet daily 12/06/20   [provider]  levothyroxine (SYNTHROID) 137 MCG tablet Take 137 mcg by mouth every evening. 01/10/21   [provider]  losartan (COZAAR) 100 MG tablet Take 100 mg by mouth every evening.    [provider]  pantoprazole (PROTONIX) 40 MG tablet Take 1 tablet (40 mg total) by mouth daily with breakfast. Patient taking differently: Take 40 mg by mouth every evening. 02/18/21 02/18/22  Johnson, Clanford L, MD  pindolol (VISKEN) 5 MG tablet Take 2.5 mg by mouth every evening. One half tablet daily    [provider]  sertraline (ZOLOFT) 50 MG tablet Take 50 mg by mouth every evening.    [provider]    Allergies    Beta adrenergic blockers, Flecainide, Levaquin [levofloxacin], Metoprolol  succinate, Prednisone, and Pseudoephedrine  Review of Systems   Review of Systems  Gastrointestinal:  Positive for vomiting.  All other systems reviewed and are negative.  Physical Exam Updated Vital Signs BP 108/60 (BP Location: Right Arm)   Pulse (!) 54   Temp 97.8 F (36.6 C)   Resp 18   Ht 5\' 5"  (1.651 m)   Wt 65.8 kg   SpO2 100%   BMI 24.13 kg/m   Physical Exam Vitals and nursing note reviewed.  Constitutional:      Appearance: She is well-developed.  HENT:  Head: Normocephalic.     Comments: Healing ecchymosis around both of her eyes and on her nose.  No open wounds that I can see.    Nose: Nose normal. No congestion or rhinorrhea.  Cardiovascular:     Rate and Rhythm: Regular rhythm. Bradycardia present.  Pulmonary:     Effort: No respiratory distress.     Breath sounds: No stridor.  Abdominal:     General: Abdomen is flat. There is no distension.  Musculoskeletal:        General: No swelling or tenderness. Normal range of motion.     Cervical back: Normal range of motion.  Skin:    General: Skin is warm and dry.  Neurological:     Mental Status: She is alert. Mental status is at baseline.    ED Results / Procedures / Treatments   Labs (all labs ordered are listed, but only abnormal results are displayed) Labs Reviewed  DIGOXIN LEVEL  PROTIME-INR  CBC WITH DIFFERENTIAL/PLATELET  COMPREHENSIVE METABOLIC PANEL  LIPASE, BLOOD  URINALYSIS, ROUTINE W REFLEX MICROSCOPIC  TROPONIN I (HIGH SENSITIVITY)    EKG EKG Interpretation  Date/Time:  Wednesday April 12 2021 23:56:30 EDT Ventricular Rate:  40 PR Interval:    QRS Duration: 102 QT Interval:  420 QTC Calculation: 343 R Axis:   59 Text Interpretation: Atrial fibrillation Repol abnrm suggests ischemia, diffuse leads TWI in III and avf appear new from may 2022 Confirmed by Merrily Pew [phone]) on 04/13/2021 12:04:43 AM  Radiology No results found.  Procedures .Critical Care  Date/Time:  04/13/2021 3:04 AM Performed by: Merrily Pew, MD Authorized by: Merrily Pew, MD   Critical care provider statement:    Critical care time (minutes):  45   Critical care was necessary to treat or prevent imminent or life-threatening deterioration of the following conditions:  Renal failure, cardiac failure and toxidrome   Critical care was time spent personally by me on the following activities:  Discussions with consultants, evaluation of patient's response to treatment, examination of patient, ordering and performing treatments and interventions, ordering and review of laboratory studies, ordering and review of radiographic studies, pulse oximetry, re-evaluation of patient's condition, obtaining history from patient or surrogate and review of old charts   Medications Ordered in ED Medications - No data to display  ED Course  I have reviewed the triage vital signs and the nursing notes.  Pertinent labs & imaging results that were available during my care of the patient were reviewed by me and considered in my medical decision making (see chart for details).    MDM Rules/Calculators/A&P   I suspect that her bradycardia and vomiting and weakness is related to digoxin toxicity.  After discussion with the pharmacist and review of her labs we elected to give potassium and Digibind at same time.  The calculated dose was 3 vials.  It was recommended to recheck her potassium every 2-4 hours for the first 8 hours after digoxin was initiated.  Also does not make sense apparently to recheck at digoxin level acutely as it will slowly rise just based on the mechanism of Digibind.  This is not considered a clinically useful test at that point.  It is more of a clinical evaluation if things do not improve in the check in another digoxin level or free digoxin level would be helpful in the future. I discussed with poison control poison control as well.  The did not suggest trending potassiums however they  felt that significant dehydration was  causative and by given significant rehydration she should have improvement.  They agreed that the Digibind would help as well.  I discussed with the hospitalist for admission.  Patient has been in ER for approximately 3 hours at this point and vital signs have not changed much her mental status has been stable as well.  I think she probably is appropriate for our hospital in the ICU as there is no indication for dialysis or other services that we don't offer.   Final Clinical Impression(s) / ED Diagnoses Final diagnoses:  None    Rx / DC Orders ED Discharge Orders     None        Christipher Rieger, Corene Cornea, MD 04/13/21 [phone]

## 2021-04-13 NOTE — ED Notes (Addendum)
Date and time results received: 04/13/21 0103  Test: Digoxin Level Critical Value: 4.1  Name of Provider Notified: Dr. Dayna Barker  Orders Received? Or Actions Taken?: Acknowledged

## 2021-04-13 NOTE — Progress Notes (Signed)
Patient seen and examined. Admitted after midnights secondary to nausea, vomiting, dehydration, AKI and anorexia. Patient found to have digoxin toxicity and mild flat troponin elevation. No fever, no CP, no SOB. Patient is COVID PCR negative. Please refer to H&P written by Dr. Clearence Ped for further info/details on admission.  Plan: -continue aggressive hydration -follow electrolytes and further replete as needed -follow digoxin and renal function trend  -slowly advance diet and use PRN antiemetics -will continue cardiac monitoring and close observation of her clinical response.  Barton Dubois MD [phone]

## 2021-04-13 NOTE — ED Notes (Signed)
ED Provider at bedside. 

## 2021-04-13 NOTE — ED Notes (Addendum)
Poison control called for an update. Update given. They requested a repeat EKG be done. EKG done and exported. MD made aware.

## 2021-04-13 NOTE — Evaluation (Signed)
Physical Therapy Evaluation Patient Details Name: Debbie Rubio MRN: [id number] DOB: 05/28/1941 Today's Date: 04/13/2021   History of Present Illness  Morgyn Slifer  is a 80 y.o. female, with history of atrial fibrillation, on digoxin and pindolol and Eliquis, history of CVA, GERD, hypertension, hypothyroidism, and more presents the ED with chief complaint of vomiting.  Patient has had intermittent nausea for few weeks.  Today's first time she had serious vomiting.  She reports she threw up 3 times and that it appeared as clear liquid.  No hematemesis.  She reports associated crampy epigastric pain.  She has not tried any medications for this pain.  She had discussed with her PCP and was prescribed omeprazole but has not taken it.  She reports no fevers or diarrhea.  She does report some constipation with her last normal bowel movement being several days ago, normal for her is every day.  She reports she drinks about 1 glass of water and about half a glass of Sprite each day.  That is the total of her p.o. intake as far as liquids go.  She has not been eating either.  She reports that for the last week she has felt dizzy when she fell and hit her nose.  She is not in any pain right now from the fall.  She denies any dysuria.   Clinical Impression  Patient agreeable to participating in PT evaluation today. Patient requesting to go to the bathroom. Patient able to perform all mobility with min guard to supervision. Patient attempted to use IV pole during ambulation making her more unsteady and increased her chance of falls. Patient declined ambulating further than to bathroom and back to bed due to fatigue. Patient was able to complete personal hygiene utilizing grab bars with supervision for safety.  Patient would continue to benefit from skilled physical therapy in current environment and next venue to continue return to prior function and increase strength, endurance, balance, coordination, and  functional mobility and gait skills.      Follow Up Recommendations Home health PT;Supervision - Intermittent    Equipment Recommendations  None recommended by PT    Recommendations for Other Services       Precautions / Restrictions Precautions Precautions: Fall Precaution Comments: patient reports 2 falls on the last six months Restrictions Weight Bearing Restrictions: No      Mobility  Bed Mobility Overal bed mobility: Needs Assistance Bed Mobility: Supine to Sit;Sit to Supine     Supine to sit: Supervision Sit to supine: Supervision        Transfers Overall transfer level: Needs assistance Equipment used: None Transfers: Sit to/from [facility];Anterior-Posterior Transfer Sit to Stand: Min guard Stand pivot transfers: Biochemist, clinical transfers: Supervision      Ambulation/Gait Ambulation/Gait assistance: Min Gaffer (Feet): 50 Feet (25x2) Assistive device: None Gait Pattern/deviations: Step-through pattern;Decreased step length - right;Decreased step length - left;Decreased stride length;Wide base of support Gait velocity: decreased   General Gait Details: somewhat slow labored gait, fairly steady on feet; attempted to ambulate with IV pole but was more unsteady than without assistive device; limited by fatigue; on room air  Stairs            Wheelchair Mobility    Modified Rankin (Stroke Patients Only)       Balance Overall balance assessment: Mild deficits observed, not formally tested  Pertinent Vitals/Pain Pain Assessment: 0-10 Pain Score: 1  Pain Location: mid back Pain Descriptors / Indicators: Aching;Dull Pain Intervention(s): Limited activity within patient's tolerance;Monitored during session;Repositioned    Home Living Family/patient expects to be discharged to:: Private residence Living  Arrangements: Alone (youngest son would come to her house each morning and evening) Available Help at Discharge: Family;Available PRN/intermittently Type of Home: House Home Access: Stairs to enter Entrance Stairs-Rails: Right;Left Entrance Stairs-Number of Steps: 6 Home Layout: Two level;Able to live on main level with bedroom/bathroom Home Equipment: Shower seat;Hand held shower head;Grab bars - tub/shower      Prior Function Level of Independence: Independent         Comments: pushed mowed her yard, drove, went dancing     Hand Dominance        Extremity/Trunk Assessment   Upper Extremity Assessment Upper Extremity Assessment: Generalized weakness    Lower Extremity Assessment Lower Extremity Assessment: Generalized weakness    Cervical / Trunk Assessment Cervical / Trunk Assessment: Kyphotic  Communication   Communication: No difficulties  Cognition Arousal/Alertness: Awake/alert Behavior During Therapy: WFL for tasks assessed/performed Overall Cognitive Status: Within Functional Limits for tasks assessed                                        General Comments      Exercises     Assessment/Plan    PT Assessment Patient needs continued PT services  PT Problem List Decreased strength;Decreased activity tolerance;Decreased balance;Decreased mobility       PT Treatment Interventions Gait training;Stair training;Functional mobility training;Therapeutic activities;Therapeutic exercise;Balance training;Patient/family education    PT Goals (Current goals can be found in the Care Plan section)  Acute Rehab PT Goals Patient Stated Goal: Go home. PT Goal Formulation: With patient Time For Goal Achievement: 04/27/21 Potential to Achieve Goals: Good    Frequency Min 3X/week   Barriers to discharge           AM-PAC PT "6 Clicks" Mobility  Outcome Measure Help needed turning from your back to your side while in a flat bed without using  bedrails?: None Help needed moving from lying on your back to sitting on the side of a flat bed without using bedrails?: A Little Help needed moving to and from a bed to a chair (including a wheelchair)?: A Little Help needed standing up from a chair using your arms (e.g., wheelchair or bedside chair)?: A Little Help needed to walk in hospital room?: A Little Help needed climbing 3-5 steps with a railing? : A Little 6 Click Score: 19    End of Session Equipment Utilized During Treatment: Gait belt Activity Tolerance: Patient limited by fatigue Patient left: in bed;with call bell/phone within reach Nurse Communication: Mobility status PT Visit Diagnosis: Unsteadiness on feet (R26.81);Other abnormalities of gait and mobility (R26.89);Repeated falls (R29.6);Muscle weakness (generalized) (M62.81);Difficulty in walking, not elsewhere classified (R26.2)    Time: 1224-8250 PT Time Calculation (min) (ACUTE ONLY): 25 min   Charges:     PT Treatments $Therapeutic Activity: 8-22 mins        Floria Raveling. Hartnett-Rands, MS, PT Per [place] [phone]  Pamala Hurry  Hartnett-Rands 04/13/2021, 12:07 PM

## 2021-04-13 NOTE — H&P (Signed)
TRH H&P    Patient Demographics:    Debbie Rubio, is a 80 y.o. female  MRN: [id number]  DOB - Jan 07, 1941  Admit Date - 04/12/2021  Referring MD/NP/PA: [place]  Outpatient Primary MD for the patient is Angiulli, Day G, MD  Patient coming from: Home  Chief complaint- Vomiting   HPI:    Debbie Rubio  is a 80 y.o. female, with history of atrial fibrillation, on digoxin and pindolol and Eliquis, history of CVA, GERD, hypertension, hypothyroidism, and more presents the ED with chief complaint of vomiting.  Patient has had intermittent nausea for few weeks.  Today's first time she had serious vomiting.  She reports she threw up 3 times and that it appeared as clear liquid.  No hematemesis.  She reports associated crampy epigastric pain.  She has not tried any medications for this pain.  She had discussed with her PCP and was prescribed omeprazole but has not taken it.  She reports no fevers or diarrhea.  She does report some constipation with her last normal bowel movement being several days ago, normal for her is every day.  She reports she drinks about 1 glass of water and about half a glass of Sprite each day.  That is the total of her p.o. intake as far as liquids go.  She has not been eating either.  She reports that for the last week she has felt dizzy when she fell and hit her nose.  She is not in any pain right now from the fall.  She denies any dysuria.  Patient does not smoke, does not drink, does not use illicit drugs.  Patient is vaccinated for COVID.  Patient is full code.  In the ED Temp 97.8, heart rate 33-54, respiratory 18-20, blood pressure 97/49, O2 sats 91% CT head and maxillofacial showed no acute intracranial abnormality.  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 Blood culture pending Chest x-ray shows no acute active disease Slight leukocytosis 11.6, hemoglobin  14.1 Chemistry panel shows a low normal potassium at 3.6, elevated BUN and creatinine at 62 and 2.20 Albumin 3.4 Troponin 85 and 90 Dig level 4.1 EKG shows a rhythm of A. fib, heart rate of 40, QTc 343 ED provider spoke with poison control who recommended hydration is going to be the most beneficial for this patient, but also recommended 3 vials of DigiFab, and potassium     Review of systems:    In addition to the HPI above,  No Fever-chills, No Headache, No changes with Vision or hearing, No problems swallowing food or Liquids, No Chest pain, Cough or Shortness of Breath, Admits to abdominal pain, nausea, vomiting, constipation  no Blood in stool or Urine, No dysuria, No new skin rashes or bruises, No new joints pains-aches,  No new asymmetric weakness, tingling, numbness in any extremity, No recent weight gain or loss, No polyuria, polydypsia or polyphagia, No significant Mental Stressors.  All other systems reviewed and are negative.    Past History of the following :    Past Medical  History:  Diagnosis Date   Anticoagulated    eliquis--- managed by pcp   Back spasm    LEFT LOWER BACK   Cognitive deficit as late effect of cerebrovascular accident (CVA) 01/2021   Dyspnea    w/ staris / long distance walking, ok with house chores   GERD (gastroesophageal reflux disease)    History of cerebrovascular accident (CVA) with residual deficit 02/15/2021   admission in epic, dx acute large right mca infarts w/ left facial droop, slurred speech, vision decifit with syncope , no acute hemorrhage;  (03-22-2021 per pt son most of speech resolved only a slight residual, droop resolved, but continues with cognitive deficet   Hypertension    Hypothyroidism    followed by pcp   IDA (iron deficiency anemia)    Loss of appetite    since CVA 05/ 2022, per pt son no desire to eat   OA (osteoarthritis)    BILATERAL HANDS,KNEES   Paroxysmal atrial fibrillation ([place])    followed by  pcp;  previously followed by dr degent   PMB (postmenopausal bleeding)    Wears dentures    full upper      Past Surgical History:  Procedure Laterality Date   DILATION AND CURETTAGE OF UTERUS     EYE SURGERY     BURNED BILATERAL TEAR DUCTS   HYSTEROSCOPY WITH D & C N/A 03/03/2018   Procedure: DILATATION AND CURETTAGE /HYSTEROSCOPY;  Surgeon: Molli Posey, MD;  Location: [facility];  Service: Gynecology;  Laterality: N/A;  patient was originally on 6/3, but was sick   HYSTEROSCOPY WITH D & C N/A 03/23/2021   Procedure: DILATATION AND CURETTAGE /HYSTEROSCOPY;  Surgeon: Molli Posey, MD;  Location: [facility];  Service: Gynecology;  Laterality: N/A;   INTRAUTERINE DEVICE (IUD) INSERTION N/A 03/23/2021   Procedure: POSSIBLE KYLEENA INTRAUTERINE DEVICE (IUD) INSERTION;  Surgeon: Molli Posey, MD;  Location: [facility];  Service: Gynecology;  Laterality: N/A;   MULTIPLE TOOTH EXTRACTIONS     upper teeth      Social History:      Social History   Tobacco Use   Smoking status: Former    Packs/day: 0.75    Years: 28.00    Pack years: 21.00    Types: Cigarettes    Quit date: 1988    Years since quitting: 34.5   Smokeless tobacco: Never  Substance Use Topics   Alcohol use: No       Family History :     Family History  Problem Relation Age of Onset   Heart attack Father    Heart disease Mother       Home Medications:   Prior to Admission medications   Medication Sig Start Date End Date Taking? Authorizing Provider  albuterol (PROVENTIL HFA;VENTOLIN HFA) 108 (90 Base) MCG/ACT inhaler Inhale 1-2 puffs into the lungs every 6 (six) hours as needed for wheezing or shortness of breath.    [provider]  apixaban (ELIQUIS) 5 MG TABS tablet Take 1 tablet (5 mg total) by mouth 2 (two) times daily. 02/24/21   Johnson, Clanford L, MD  atorvastatin (LIPITOR) 20 MG tablet Take 1 tablet (20 mg total) by mouth every evening. 02/17/21    Johnson, Clanford L, MD  digoxin (LANOXIN) 0.25 MG tablet Take 250 mcg by mouth every evening.    [provider]  ferrous sulfate 325 (65 FE) MG tablet Take 1 tablet (325 mg total) by mouth daily. Patient not taking: Reported  on 03/22/2021 02/26/21   Ripley Fraise, MD  indapamide (LOZOL) 2.5 MG tablet Take 2.5 mg by mouth See admin instructions. Take 1/2 tablet daily 12/06/20   [provider]  levothyroxine (SYNTHROID) 137 MCG tablet Take 137 mcg by mouth every evening. 01/10/21   [provider]  losartan (COZAAR) 100 MG tablet Take 100 mg by mouth every evening.    [provider]  pantoprazole (PROTONIX) 40 MG tablet Take 1 tablet (40 mg total) by mouth daily with breakfast. Patient taking differently: Take 40 mg by mouth every evening. 02/18/21 02/18/22  Johnson, Clanford L, MD  pindolol (VISKEN) 5 MG tablet Take 2.5 mg by mouth every evening. One half tablet daily    [provider]  sertraline (ZOLOFT) 50 MG tablet Take 50 mg by mouth every evening.    [provider]     Allergies:     Allergies  Allergen Reactions   Beta Adrenergic Blockers     REACTION: bradycardia and hypotension. Pt reports she is sensitive to the high doses, but she can take the lower doses.   Flecainide Swelling   Levaquin [Levofloxacin] Other (See Comments)    bradycardia   Metoprolol Succinate     REACTION: severe hypotension   Prednisone Other (See Comments)    Pt states she cannot take due to afib   Pseudoephedrine     REACTION: causes tachycardia     Physical Exam:   Vitals  Blood pressure (!) 103/48, pulse (!) 42, temperature 98.2 F (36.8 C), temperature source Oral, resp. rate 17, height 5\' 5"  (1.651 m), weight 65.8 kg, SpO2 98 %.  1.  General: Patient lying supine in bed,  no acute distress   2. Psychiatric: Alert and oriented x 3, flat affect and behavior normal for situation, pleasant and cooperative with exam   3.  Neurologic: Speech and language are normal, face is symmetric, moves all 4 extremities voluntarily, at baseline without acute deficits on limited exam   4. HEENMT:  Bilateral black eyes, normocephalic, pupils reactive to light, neck is supple, trachea is midline, mucous membranes are moist   5. Respiratory : Lungs are clear to auscultation bilaterally without wheezing, rhonchi, rales, no cyanosis, no increase in work of breathing or accessory muscle use   6. Cardiovascular : Heart rate normal, rhythm is regular, murmur present, no rubs or gallops, no peripheral edema, peripheral pulses palpated   7. Gastrointestinal:  Abdomen is soft, nondistended, nontender to palpation bowel sounds active, no masses or organomegaly palpated   8. Skin:  Skin is warm, dry and intact without rashes, acute lesions, or ulcers on limited exam   9.Musculoskeletal:  No acute deformities or trauma, no asymmetry in tone, no peripheral edema, peripheral pulses palpated, no tenderness to palpation in the extremities     Data Review:    CBC Recent Labs  Lab 04/13/21 0021  WBC 11.6*  HGB 14.1  HCT 42.5  PLT 303  MCV 83.3  MCH 27.6  MCHC 33.2  RDW 15.9*  LYMPHSABS 1.7  MONOABS 1.0  EOSABS 0.0  BASOSABS 0.0   ------------------------------------------------------------------------------------------------------------------  Results for orders placed or performed during the hospital encounter of 04/12/21 (from the past 48 hour(s))  Digoxin level     Status: Abnormal   Collection Time: 04/13/21 12:21 AM  Result Value Ref Range   Digoxin Level 4.1 (HH) 0.8 - 2.0 ng/mL    Comment: CRITICAL RESULT CALLED TO, READ BACK BY AND VERIFIED WITH: BRAME,M@[id number]  BYMATTHEWS, B  7.21.22 Performed at [facility], [address] [place], [place] [address]   Protime-INR     Status: Abnormal   Collection Time: 04/13/21 12:21 AM  Result Value Ref Range   Prothrombin Time 15.8 (H) 11.4 - 15.2 seconds   INR 1.3 (H)  0.8 - 1.2    Comment: (NOTE) INR goal varies based on device and disease states. Performed at [facility], [address], [place], [place] [address]   CBC with Differential     Status: Abnormal   Collection Time: 04/13/21 12:21 AM  Result Value Ref Range   WBC 11.6 (H) 4.0 - 10.5 K/uL   RBC 5.10 3.87 - 5.11 MIL/uL   Hemoglobin 14.1 12.0 - 15.0 g/dL   HCT 42.5 36.0 - 46.0 %   MCV 83.3 80.0 - 100.0 fL   MCH 27.6 26.0 - 34.0 pg   MCHC 33.2 30.0 - 36.0 g/dL   RDW 15.9 (H) 11.5 - 15.5 %   Platelets 303 150 - 400 K/uL   nRBC 0.0 0.0 - 0.2 %   Neutrophils Relative % 76 %   Neutro Abs 8.8 (H) 1.7 - 7.7 K/uL   Lymphocytes Relative 15 %   Lymphs Abs 1.7 0.7 - 4.0 K/uL   Monocytes Relative 8 %   Monocytes Absolute 1.0 0.1 - 1.0 K/uL   Eosinophils Relative 0 %   Eosinophils Absolute 0.0 0.0 - 0.5 K/uL   Basophils Relative 0 %   Basophils Absolute 0.0 0.0 - 0.1 K/uL   Immature Granulocytes 1 %   Abs Immature Granulocytes 0.07 0.00 - 0.07 K/uL    Comment: Performed at [facility], [address], [place], [place] [address]  Comprehensive metabolic panel     Status: Abnormal   Collection Time: 04/13/21 12:21 AM  Result Value Ref Range   Sodium 134 (L) 135 - 145 mmol/L   Potassium 3.6 3.5 - 5.1 mmol/L   Chloride 96 (L) 98 - 111 mmol/L   CO2 23 22 - 32 mmol/L   Glucose, Bld 126 (H) 70 - 99 mg/dL    Comment: Glucose reference range applies only to samples taken after fasting for at least 8 hours.   BUN 62 (H) 8 - 23 mg/dL   Creatinine, Ser 2.20 (H) 0.44 - 1.00 mg/dL   Calcium 8.6 (L) 8.9 - 10.3 mg/dL   Total Protein 7.3 6.5 - 8.1 g/dL   Albumin 3.4 (L) 3.5 - 5.0 g/dL   AST 26 15 - 41 U/L   ALT 22 0 - 44 U/L   Alkaline Phosphatase 77 38 - 126 U/L   Total Bilirubin 1.7 (H) 0.3 - 1.2 mg/dL   GFR, Estimated 22 (L) >60 mL/min    Comment: (NOTE) Calculated using the CKD-EPI Creatinine Equation (2021)    Anion gap 15 5 - 15    Comment: Performed at [facility], [address]  [place], [place] [address]  Lipase, blood     Status: Abnormal   Collection Time: 04/13/21 12:21 AM  Result Value Ref Range   Lipase 68 (H) 11 - 51 U/L    Comment: Performed at [facility], [address], [place], [place] [address]  Troponin I (High Sensitivity)     Status: Abnormal   Collection Time: 04/13/21 12:21 AM  Result Value Ref Range   Troponin I (High Sensitivity) 85 (H) <18 ng/L    Comment: (NOTE) Elevated high sensitivity troponin I (hsTnI) values and significant  changes across serial measurements may suggest  ACS but many other  chronic and acute conditions are known to elevate hsTnI results.  Refer to the "Links" section for chest pain algorithms and additional  guidance. Performed at [facility], [address] [place], [place] [address]   CBG monitoring, ED     Status: Abnormal   Collection Time: 04/13/21 12:31 AM  Result Value Ref Range   Glucose-Capillary 122 (H) 70 - 99 mg/dL    Comment: Glucose reference range applies only to samples taken after fasting for at least 8 hours.   Comment 1 Notify RN    Comment 2 Document in Chart   Blood culture (routine x 2)     Status: None (Preliminary result)   Collection Time: 04/13/21 12:56 AM   Specimen: BLOOD  Result Value Ref Range   Specimen Description BLOOD BLOOD RIGHT HAND    Special Requests      BOTTLES DRAWN AEROBIC AND ANAEROBIC Blood Culture adequate volume   Culture      NO GROWTH < 12 HOURS Performed at [facility], [address], [place], [place] [address]    Report Status PENDING   Blood culture (routine x 2)     Status: None (Preliminary result)   Collection Time: 04/13/21  1:18 AM   Specimen: BLOOD  Result Value Ref Range   Specimen Description BLOOD    Special Requests NONE    Culture      NO GROWTH < 12 HOURS Performed at [facility], [address] [place], [place] [address]    Report Status PENDING   Troponin I (High Sensitivity)     Status: Abnormal   Collection Time: 04/13/21  2:35 AM   Result Value Ref Range   Troponin I (High Sensitivity) 90 (H) <18 ng/L    Comment: (NOTE) Elevated high sensitivity troponin I (hsTnI) values and significant  changes across serial measurements may suggest ACS but many other  chronic and acute conditions are known to elevate hsTnI results.  Refer to the "Links" section for chest pain algorithms and additional  guidance. Performed at [facility], [address] [place], [place] [address]   Resp Panel by RT-PCR (Flu A&B, Covid) Nasopharyngeal Swab     Status: None   Collection Time: 04/13/21  3:51 AM   Specimen: Nasopharyngeal Swab; Nasopharyngeal(NP) swabs in vial transport medium  Result Value Ref Range   SARS Coronavirus 2 by RT PCR NEGATIVE NEGATIVE    Comment: (NOTE) SARS-CoV-2 target nucleic acids are NOT DETECTED.  The SARS-CoV-2 RNA is generally detectable in upper respiratory specimens during the acute phase of infection. The lowest concentration of SARS-CoV-2 viral copies this assay can detect is 138 copies/mL. A negative result does not preclude SARS-Cov-2 infection and should not be used as the sole basis for treatment or other patient management decisions. A negative result may occur with  improper specimen collection/handling, submission of specimen other than nasopharyngeal swab, presence of viral mutation(s) within the areas targeted by this assay, and inadequate number of viral copies(<138 copies/mL). A negative result must be combined with clinical observations, patient history, and epidemiological information. The expected result is Negative.  Fact Sheet for Patients:  EntrepreneurPulse.com.au  Fact Sheet for Healthcare Providers:  IncredibleEmployment.be  This test is no t yet approved or cleared by the [place] FDA and  has been authorized for detection and/or diagnosis of SARS-CoV-2 by FDA under an Emergency Use Authorization (EUA). This EUA will remain  in effect  (meaning this test can be used) for  the duration of the COVID-19 declaration under Section 564(b)(1) of the Act, 21 U.S.C.section 360bbb-3(b)(1), unless the authorization is terminated  or revoked sooner.       Influenza A by PCR NEGATIVE NEGATIVE   Influenza B by PCR NEGATIVE NEGATIVE    Comment: (NOTE) The Xpert Xpress SARS-CoV-2/FLU/RSV plus assay is intended as an aid in the diagnosis of influenza from Nasopharyngeal swab specimens and should not be used as a sole basis for treatment. Nasal washings and aspirates are unacceptable for Xpert Xpress SARS-CoV-2/FLU/RSV testing.  Fact Sheet for Patients: EntrepreneurPulse.com.au  Fact Sheet for Healthcare Providers: IncredibleEmployment.be  This test is not yet approved or cleared by the [place] FDA and has been authorized for detection and/or diagnosis of SARS-CoV-2 by FDA under an Emergency Use Authorization (EUA). This EUA will remain in effect (meaning this test can be used) for the duration of the COVID-19 declaration under Section 564(b)(1) of the Act, 21 U.S.C. section 360bbb-3(b)(1), unless the authorization is terminated or revoked.  Performed at [facility], [address] [place], [place] [address]     Chemistries  Recent Labs  Lab 04/13/21 0021  NA 134*  K 3.6  CL 96*  CO2 23  GLUCOSE 126*  BUN 62*  CREATININE 2.20*  CALCIUM 8.6*  AST 26  ALT 22  ALKPHOS 77  BILITOT 1.7*   ------------------------------------------------------------------------------------------------------------------  ------------------------------------------------------------------------------------------------------------------ GFR: Estimated Creatinine Clearance: 19 mL/min (A) (by C-G formula based on SCr of 2.2 mg/dL (H)). Liver Function Tests: Recent Labs  Lab 04/13/21 0021  AST 26  ALT 22  ALKPHOS 77  BILITOT 1.7*  PROT 7.3  ALBUMIN 3.4*   Recent Labs  Lab 04/13/21 0021   LIPASE 68*   No results for input(s): AMMONIA in the last 168 hours. Coagulation Profile: Recent Labs  Lab 04/13/21 0021  INR 1.3*   Cardiac Enzymes: No results for input(s): CKTOTAL, CKMB, CKMBINDEX, TROPONINI in the last 168 hours. BNP (last 3 results) No results for input(s): PROBNP in the last 8760 hours. HbA1C: No results for input(s): HGBA1C in the last 72 hours. CBG: Recent Labs  Lab 04/13/21 0031  GLUCAP 122*   Lipid Profile: No results for input(s): CHOL, HDL, LDLCALC, TRIG, CHOLHDL, LDLDIRECT in the last 72 hours. Thyroid Function Tests: No results for input(s): TSH, T4TOTAL, FREET4, T3FREE, THYROIDAB in the last 72 hours. Anemia Panel: No results for input(s): VITAMINB12, FOLATE, FERRITIN, TIBC, IRON, RETICCTPCT in the last 72 hours.  --------------------------------------------------------------------------------------------------------------- Urine analysis:    Component Value Date/Time   COLORURINE YELLOW 02/25/2021 2002   APPEARANCEUR HAZY (A) 02/25/2021 2002   LABSPEC 1.018 02/25/2021 2002   PHURINE 5.0 02/25/2021 2002   GLUCOSEU NEGATIVE 02/25/2021 2002   HGBUR LARGE (A) 02/25/2021 2002   BILIRUBINUR NEGATIVE 02/25/2021 2002   [place] 02/25/2021 2002   PROTEINUR NEGATIVE 02/25/2021 2002   NITRITE NEGATIVE 02/25/2021 2002   LEUKOCYTESUR NEGATIVE 02/25/2021 2002      Imaging Results:    CT Head Wo Contrast  Result Date: 04/13/2021 CLINICAL DATA:  Multiple falls, vomiting EXAM: CT HEAD WITHOUT CONTRAST CT MAXILLOFACIAL WITHOUT CONTRAST TECHNIQUE: Multidetector CT imaging of the head and maxillofacial structures were performed using the standard protocol without intravenous contrast. Multiplanar CT image reconstructions of the maxillofacial structures were also generated. COMPARISON:  CT head dated 02/15/2021 FINDINGS: CT HEAD FINDINGS Brain: No evidence of acute infarction, hemorrhage, hydrocephalus, extra-axial collection or mass  lesion/mass effect. Encephalomalacic changes related to old right MCA distribution infarct in the right frontoparietal  region and anterior temporal lobe. Vascular: Intracranial atherosclerosis. Skull: Normal. Negative for fracture or focal lesion. Other: None. CT MAXILLOFACIAL FINDINGS Osseous: No evidence of maxillofacial fracture. Mandible is intact. Bilateral mandibular condyles are well-seated in the TMJs. Orbits: Bilateral orbits, including the globes and retroconal soft tissues, are within normal limits. Sinuses: Visualized paranasal sinuses are clear. Soft tissues: Negative. IMPRESSION: No evidence of acute intracranial abnormality. 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 No evidence of maxillofacial fracture. Electronically Signed   By: Julian Hy M.D.   On: 04/13/2021 02:07   DG Chest Portable 1 View  Result Date: 04/13/2021 CLINICAL DATA:  eva.  Emesis.  Low back pain and epigastric pain. EXAM: PORTABLE CHEST 1 VIEW COMPARISON:  12/02/2015 FINDINGS: The heart size and mediastinal contours are within normal limits. Both lungs are clear. The visualized skeletal structures are unremarkable. IMPRESSION: No active disease. Electronically Signed   By: Ulyses Jarred M.D.   On: 04/13/2021 00:35   CT Maxillofacial Wo Contrast  Result Date: 04/13/2021 CLINICAL DATA:  Multiple falls, vomiting EXAM: CT HEAD WITHOUT CONTRAST CT MAXILLOFACIAL WITHOUT CONTRAST TECHNIQUE: Multidetector CT imaging of the head and maxillofacial structures were performed using the standard protocol without intravenous contrast. Multiplanar CT image reconstructions of the maxillofacial structures were also generated. COMPARISON:  CT head dated 02/15/2021 FINDINGS: CT HEAD FINDINGS Brain: No evidence of acute infarction, hemorrhage, hydrocephalus, extra-axial collection or mass lesion/mass effect. Encephalomalacic changes related to old right MCA distribution infarct in the right frontoparietal region and anterior temporal lobe.  Vascular: Intracranial atherosclerosis. Skull: Normal. Negative for fracture or focal lesion. Other: None. CT MAXILLOFACIAL FINDINGS Osseous: No evidence of maxillofacial fracture. Mandible is intact. Bilateral mandibular condyles are well-seated in the TMJs. Orbits: Bilateral orbits, including the globes and retroconal soft tissues, are within normal limits. Sinuses: Visualized paranasal sinuses are clear. Soft tissues: Negative. IMPRESSION: No evidence of acute intracranial abnormality. 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 No evidence of maxillofacial fracture. Electronically Signed   By: Julian Hy M.D.   On: 04/13/2021 02:07       Assessment & Plan:    Principal Problem:   Digoxin overdose Active Problems:   Hypothyroidism   ATRIAL FIBRILLATION, PAROXYSMAL   Elevated troponin   AKI (acute kidney injury) ([place])   Dehydration   Mild protein-calorie malnutrition ([facility])   Recurrent falls    elevated digoxin level Dig level 4.1 Likely related to AKI Bradycardia, dizziness, emesis Poison control recs as in HPI Continue hydration Monitor CMP and CBC in the a.m. Monitor in ICU AKI/dehydration Secondary to poor p.o. intake Creatinine a couple of months ago 0.79, today 2.20 2 L bolus in ED Continue IV fluids 150 mL/h Avoid nephrotoxic agents when possible Trend in the a.m. Mild protein calorie malnutrition Patient does not monitor I am sure in the hospital Advised making milkshakes with protein shakes at home to get those extra calories Advised appropriate liquid p.o. intake as well Elevated troponin Stable at 85 and 90 EKG is without acute ischemic changes Patient is chest pain-free Monitoring in ICU Will recheck troponin if patient becomes symptomatic Leukocytosis Likely acute phase reactant UA pending Chest x-ray is without acute findings Continue to monitor Recurrent falls Consult PT eval and treat CT head and maxillofacial without acute findings Thyroid  disease Continue Synthroid Check TSH Atrial fibrillation Hold beta-blocker, hold dig Continue Eliquis   DVT Prophylaxis-   Eliquis- SCDs   AM Labs Ordered, also please review Full Orders  Family Communication: Admission, patients condition and plan  of care including tests being ordered have been discussed with the patient and son who indicate understanding and agree with the plan and Code Status.  Code Status: Full  Admission status: Inpatient :The appropriate admission status for this patient is INPATIENT. Inpatient status is judged to be reasonable and necessary in order to provide the required intensity of service to ensure the patient's safety. The patient's presenting symptoms, physical exam findings, and initial radiographic and laboratory data in the context of their chronic comorbidities is felt to place them at high risk for further clinical deterioration. Furthermore, it is not anticipated that the patient will be medically stable for discharge from the hospital within 2 midnights of admission. The following factors support the admission status of inpatient.     The patient's presenting symptoms include emesis. The worrisome physical exam findings include bradycardia. The initial radiographic and laboratory data are worrisome because of elevated dig level. The chronic co-morbidities include atrial fibrillation, GERD, history of CVA, hypertension, hypothyroidism.       * I certify that at the point of admission it is my clinical judgment that the patient will require inpatient hospital care spanning beyond 2 midnights from the point of admission due to high intensity of service, high risk for further deterioration and high frequency of surveillance required.*  Time spent in minutes : [place]

## 2021-04-14 DIAGNOSIS — E038 Other specified hypothyroidism: Secondary | ICD-10-CM

## 2021-04-14 DIAGNOSIS — N179 Acute kidney failure, unspecified: Secondary | ICD-10-CM

## 2021-04-14 DIAGNOSIS — R5381 Other malaise: Secondary | ICD-10-CM

## 2021-04-14 DIAGNOSIS — T460X1D Poisoning by cardiac-stimulant glycosides and drugs of similar action, accidental (unintentional), subsequent encounter: Secondary | ICD-10-CM

## 2021-04-14 DIAGNOSIS — E860 Dehydration: Secondary | ICD-10-CM

## 2021-04-14 LAB — CBC
HCT: 32.7 % — ABNORMAL LOW (ref 36.0–46.0)
Hemoglobin: 10.6 g/dL — ABNORMAL LOW (ref 12.0–15.0)
MCH: 27.4 pg (ref 26.0–34.0)
MCHC: 32.4 g/dL (ref 30.0–36.0)
MCV: 84.5 fL (ref 80.0–100.0)
Platelets: 182 10*3/uL (ref 150–400)
RBC: 3.87 MIL/uL (ref 3.87–5.11)
RDW: 16.4 % — ABNORMAL HIGH (ref 11.5–15.5)
WBC: 7.2 10*3/uL (ref 4.0–10.5)
nRBC: 0 % (ref 0.0–0.2)

## 2021-04-14 LAB — COMPREHENSIVE METABOLIC PANEL
ALT: 22 U/L (ref 0–44)
AST: 26 U/L (ref 15–41)
Albumin: 2.5 g/dL — ABNORMAL LOW (ref 3.5–5.0)
Alkaline Phosphatase: 53 U/L (ref 38–126)
Anion gap: 6 (ref 5–15)
BUN: 47 mg/dL — ABNORMAL HIGH (ref 8–23)
CO2: 23 mmol/L (ref 22–32)
Calcium: 7.7 mg/dL — ABNORMAL LOW (ref 8.9–10.3)
Chloride: 109 mmol/L (ref 98–111)
Creatinine, Ser: 1.33 mg/dL — ABNORMAL HIGH (ref 0.44–1.00)
GFR, Estimated: 41 mL/min — ABNORMAL LOW (ref >60)
Glucose, Bld: 101 mg/dL — ABNORMAL HIGH (ref 70–99)
Potassium: 2.9 mmol/L — ABNORMAL LOW (ref 3.5–5.1)
Sodium: 138 mmol/L (ref 135–145)
Total Bilirubin: 1.3 mg/dL — ABNORMAL HIGH (ref 0.3–1.2)
Total Protein: 5.3 g/dL — ABNORMAL LOW (ref 6.5–8.1)

## 2021-04-14 LAB — TSH: TSH: 2.128 u[IU]/mL (ref 0.350–4.500)

## 2021-04-14 LAB — MAGNESIUM: Magnesium: 1.4 mg/dL — ABNORMAL LOW (ref 1.7–2.4)

## 2021-04-14 MED ORDER — POTASSIUM CHLORIDE CRYS ER 20 MEQ PO TBCR
40.0000 meq | EXTENDED_RELEASE_TABLET | Freq: Once | ORAL | Status: AC
  Administered 2021-04-14: 40 meq via ORAL
  Filled 2021-04-14: qty 2

## 2021-04-14 MED ORDER — ONDANSETRON 8 MG PO TBDP: 8.0000 mg | ORAL_TABLET | Freq: Three times a day (TID) | ORAL | 0 refills | Status: AC | PRN

## 2021-04-14 MED ORDER — LOSARTAN POTASSIUM 100 MG PO TABS: 100.0000 mg | ORAL_TABLET | Freq: Every evening | ORAL | Status: DC

## 2021-04-14 MED ORDER — SERTRALINE HCL 50 MG PO TABS: 50.0000 mg | ORAL_TABLET | Freq: Every evening | ORAL | 1 refills | Status: AC

## 2021-04-14 NOTE — Discharge Summary (Signed)
Physician Discharge Summary  Debbie Rubio [id number] DOB: 1941/09/20 DOA: 04/12/2021  PCP: Caryl Bis, MD  Admit date: 04/12/2021 Discharge date: 04/14/2021  Time spent: 35 minutes  Recommendations for Outpatient Follow-up:  Repeat basic metabolic panel to follow electrolytes and renal function Repeat CBC to follow stability and trend of WBCs. Outpatient cardiology follow-up recommended to further assess atrial fibrillation treatment/management.   Discharge Diagnoses:  Principal Problem:   Digoxin overdose Active Problems:   Hypothyroidism   ATRIAL FIBRILLATION, PAROXYSMAL   Elevated troponin   AKI (acute kidney injury) ([place])   Dehydration Chronic diastolic heart failure.   Recurrent falls   Physical deconditioning   Discharge Condition: Stable and improved.  Discharged home with instruction to follow-up with PCP.  CODE STATUS: Full code  Diet recommendation: Heart healthy diet.  Filed Weights   04/12/21 2345 04/13/21 1304  Weight: 65.8 kg 63.2 kg    History of present illness:  As per H&P written by Dr. Clearence Ped On 04/14/2021 Debbie Rubio  is a 80 y.o. female, with history of atrial fibrillation, on digoxin and pindolol and Eliquis, history of CVA, GERD, hypertension, hypothyroidism, and more presents the ED with chief complaint of vomiting.  Patient has had intermittent nausea for few weeks.  Today's first time she had serious vomiting.  She reports she threw up 3 times and that it appeared as clear liquid.  No hematemesis.  She reports associated crampy epigastric pain.  She has not tried any medications for this pain.  She had discussed with her PCP and was prescribed omeprazole but has not taken it.  She reports no fevers or diarrhea.  She does report some constipation with her last normal bowel movement being several days ago, normal for her is every day.  She reports she drinks about 1 glass of water and about half a glass of Sprite each day.  That is  the total of her p.o. intake as far as liquids go.  She has not been eating either.  She reports that for the last week she has felt dizzy when she fell and hit her nose.  She is not in any pain right now from the fall.  She denies any dysuria.   Patient does not smoke, does not drink, does not use illicit drugs.  Patient is vaccinated for COVID.  Patient is full code.   In the ED Temp 97.8, heart rate 33-54, respiratory 18-20, blood pressure 97/49, O2 sats 91% CT head and maxillofacial showed no acute intracranial abnormality.  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 Blood culture pending Chest x-ray shows no acute active disease Slight leukocytosis 11.6, hemoglobin 14.1 Chemistry panel shows a low normal potassium at 3.6, elevated BUN and creatinine at 62 and 2.20 Albumin 3.4 Troponin 85 and 90 Dig level 4.1 EKG shows a rhythm of A. fib, heart rate of 40, QTc 343 ED provider spoke with poison control who recommended hydration is going to be the most beneficial for this patient, but also recommended 3 vials of DigiFab, and potassium.   Hospital Course:  Nonintentional digoxin toxicity -In the setting of acute kidney injury/prerenal azotemia from dehydration. -Patient received 3 vials of DigiFab along with fluid resuscitation and close monitoring of her cardiac activity -At discharge digoxin remains to be on hold onto follow-up with her PCP/cardiology service. -Advised to maintain adequate hydration.  Acute kidney injury/dehydration -In the setting of poor oral intake and increased GI losses -Concerning for possible initial viral gastroenteritis that is  at this moment resolved with a subsequent symptoms leading to GI losses from an digoxin toxicity. -Patient's symptoms resolved at discharge -Advised to maintain adequate hydration -Continue minimizing nephrotoxic agents -Repeat basic metabolic panel follow-up visit to assess electrolytes and renal function  trend.  Leukocytosis -In the setting of acute phase reactant and dehydration -No source of infection appreciated -No antibiotics given. -Patient advised to maintain adequate hydration -Repeat CBC at follow-up visit to assess resolution and the stability of WBCs.  Falls/physical deconditioning -Patient has been seen by physical therapy and home health services has been arranged at time of discharge. -A rolling walker has been requested to assist with ambulation.  Hypokalemia -In the setting of GI losses and poor oral intake -Repleted and patient discharge back on ACE inhibitor's. -Repeat basic metabolic panel follow-up visit to assess electrolytes trend.  History of atrial fibrillation -Digoxin discontinued to follow-up visit -Continue Eliquis for secondary prevention -Continue treatment with pindolol.   -Will recommend outpatient follow-up with cardiology service.  Hypothyroidism -Continue Synthroid -TSH within normal limits.  Chronic diastolic heart failure -Stable and compensated currently -Advised to check diet and daily weights is and to be mindful about sodium ingestion. -Resume home heart medication regimen and follow-up with PCP and cardiology as an outpatient.  Procedures: See below for x-ray reports.  Consultations: None in-house (recommendations provided by poison control center).    Discharge Exam: Vitals:   04/14/21 1400 04/14/21 1500  BP:    Pulse: (!) 52 (!) 46  Resp:    Temp:    SpO2: 98% 99%    General: No chest pain, no nausea, no vomiting.  Patient reports feeling better urine is still weak.  Denies palpitations or focal weakness. Cardiovascular: Rate controlled, no rubs, no gallops, no JVD. Respiratory: Good air movement; no using accessory muscle.  No wheezing or crackles.  Good saturation on room air. Abdomen: Soft, nontender, positive bowel sounds Extremities: No cyanosis or clubbing.  Discharge Instructions   Discharge Instructions      (HEART FAILURE PATIENTS) Call MD:  Anytime you have any of the following symptoms: 1) 3 pound weight gain in 24 hours or 5 pounds in 1 week 2) shortness of breath, with or without a dry hacking cough 3) swelling in the hands, feet or stomach 4) if you have to sleep on extra pillows at night in order to breathe.   Complete by: As directed    Diet - low sodium heart healthy   Complete by: As directed    Discharge instructions   Complete by: As directed    Take medications as prescribed Maintain adequate hydration Arrange follow-up with PCP in 10 days Follow instructions by home health service providers.      Allergies as of 04/14/2021       Reactions   Beta Adrenergic Blockers    REACTION: bradycardia and hypotension. Pt reports she is sensitive to the high doses, but she can take the lower doses.   Flecainide Swelling   Levaquin [levofloxacin] Other (See Comments)   bradycardia   Metoprolol Succinate    REACTION: severe hypotension   Prednisone Other (See Comments)   Pt states she cannot take due to afib   Pseudoephedrine    REACTION: causes tachycardia        Medication List     STOP taking these medications    digoxin 0.25 MG tablet Commonly known as: LANOXIN   ferrous sulfate 325 (65 FE) MG tablet       TAKE  these medications    albuterol 108 (90 Base) MCG/ACT inhaler Commonly known as: VENTOLIN HFA Inhale 1-2 puffs into the lungs every 6 (six) hours as needed for wheezing or shortness of breath.   apixaban 5 MG Tabs tablet Commonly known as: ELIQUIS Take 1 tablet (5 mg total) by mouth 2 (two) times daily.   atorvastatin 20 MG tablet Commonly known as: LIPITOR Take 1 tablet (20 mg total) by mouth every evening.   indapamide 2.5 MG tablet Commonly known as: LOZOL Take 2.5 mg by mouth See admin instructions. Take 1/2 tablet daily   levothyroxine 137 MCG tablet Commonly known as: SYNTHROID Take 137 mcg by mouth every evening.   losartan 100 MG  tablet Commonly known as: COZAAR Take 1 tablet (100 mg total) by mouth every evening. Start taking on: April 15, 2021   mirtazapine 15 MG tablet Commonly known as: REMERON Take 15 mg by mouth at bedtime.   ondansetron 8 MG disintegrating tablet Commonly known as: Zofran ODT Take 1 tablet (8 mg total) by mouth every 8 (eight) hours as needed for nausea or vomiting.   pantoprazole 40 MG tablet Commonly known as: Protonix Take 1 tablet (40 mg total) by mouth daily with breakfast. What changed: when to take this   pindolol 5 MG tablet Commonly known as: VISKEN Take 2.5 mg by mouth every evening.   sertraline 50 MG tablet Commonly known as: ZOLOFT Take 1 tablet (50 mg total) by mouth every evening.               Durable Medical Equipment  (From admission, onward)           Start     Ordered   04/14/21 1143  For home use only DME 4 wheeled rolling walker with seat  Once       Question:  Patient needs a walker to treat with the following condition  Answer:  Stroke ([place])   04/14/21 1144           Allergies  Allergen Reactions   Beta Adrenergic Blockers     REACTION: bradycardia and hypotension. Pt reports she is sensitive to the high doses, but she can take the lower doses.   Flecainide Swelling   Levaquin [Levofloxacin] Other (See Comments)    bradycardia   Metoprolol Succinate     REACTION: severe hypotension   Prednisone Other (See Comments)    Pt states she cannot take due to afib   Pseudoephedrine     REACTION: causes tachycardia    Follow-up Information     [facility] Follow up.   Why: PT will call to schedule your first home visit.        [facility] Follow up.   Why: Rolling walker        Angiulli, Day G, MD. Schedule an appointment as soon as possible for a visit in 1 week(s).   Specialty: Family Medicine Contact information: [place] [address] [phone]                 The results of  significant diagnostics from this hospitalization (including imaging, microbiology, ancillary and laboratory) are listed below for reference.    Significant Diagnostic Studies: CT Head Wo Contrast  Result Date: 04/13/2021 CLINICAL DATA:  Multiple falls, vomiting EXAM: CT HEAD WITHOUT CONTRAST CT MAXILLOFACIAL WITHOUT CONTRAST TECHNIQUE: Multidetector CT imaging of the head and maxillofacial structures were performed using the standard protocol without intravenous contrast. Multiplanar CT  image reconstructions of the maxillofacial structures were also generated. COMPARISON:  CT head dated 02/15/2021 FINDINGS: CT HEAD FINDINGS Brain: No evidence of acute infarction, hemorrhage, hydrocephalus, extra-axial collection or mass lesion/mass effect. Encephalomalacic changes related to old right MCA distribution infarct in the right frontoparietal region and anterior temporal lobe. Vascular: Intracranial atherosclerosis. Skull: Normal. Negative for fracture or focal lesion. Other: None. CT MAXILLOFACIAL FINDINGS Osseous: No evidence of maxillofacial fracture. Mandible is intact. Bilateral mandibular condyles are well-seated in the TMJs. Orbits: Bilateral orbits, including the globes and retroconal soft tissues, are within normal limits. Sinuses: Visualized paranasal sinuses are clear. Soft tissues: Negative. IMPRESSION: No evidence of acute intracranial abnormality. 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 No evidence of maxillofacial fracture. Electronically Signed   By: Julian Hy M.D.   On: 04/13/2021 02:07   DG Chest Portable 1 View  Result Date: 04/13/2021 CLINICAL DATA:  eva.  Emesis.  Low back pain and epigastric pain. EXAM: PORTABLE CHEST 1 VIEW COMPARISON:  12/02/2015 FINDINGS: The heart size and mediastinal contours are within normal limits. Both lungs are clear. The visualized skeletal structures are unremarkable. IMPRESSION: No active disease. Electronically Signed   By: Ulyses Jarred M.D.   On:  04/13/2021 00:35   CT Maxillofacial Wo Contrast  Result Date: 04/13/2021 CLINICAL DATA:  Multiple falls, vomiting EXAM: CT HEAD WITHOUT CONTRAST CT MAXILLOFACIAL WITHOUT CONTRAST TECHNIQUE: Multidetector CT imaging of the head and maxillofacial structures were performed using the standard protocol without intravenous contrast. Multiplanar CT image reconstructions of the maxillofacial structures were also generated. COMPARISON:  CT head dated 02/15/2021 FINDINGS: CT HEAD FINDINGS Brain: No evidence of acute infarction, hemorrhage, hydrocephalus, extra-axial collection or mass lesion/mass effect. Encephalomalacic changes related to old right MCA distribution infarct in the right frontoparietal region and anterior temporal lobe. Vascular: Intracranial atherosclerosis. Skull: Normal. Negative for fracture or focal lesion. Other: None. CT MAXILLOFACIAL FINDINGS Osseous: No evidence of maxillofacial fracture. Mandible is intact. Bilateral mandibular condyles are well-seated in the TMJs. Orbits: Bilateral orbits, including the globes and retroconal soft tissues, are within normal limits. Sinuses: Visualized paranasal sinuses are clear. Soft tissues: Negative. IMPRESSION: No evidence of acute intracranial abnormality. 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 No evidence of maxillofacial fracture. Electronically Signed   By: Julian Hy M.D.   On: 04/13/2021 02:07    Microbiology: Recent Results (from the past 240 hour(s))  Blood culture (routine x 2)     Status: None (Preliminary result)   Collection Time: 04/13/21 12:56 AM   Specimen: BLOOD  Result Value Ref Range Status   Specimen Description BLOOD BLOOD RIGHT HAND  Final   Special Requests   Final    BOTTLES DRAWN AEROBIC AND ANAEROBIC Blood Culture adequate volume   Culture   Final    NO GROWTH 1 DAY Performed at [facility], [address], [place], [place] [address]    Report Status PENDING  Incomplete  Blood culture (routine x 2)     Status: None  (Preliminary result)   Collection Time: 04/13/21  1:18 AM   Specimen: BLOOD  Result Value Ref Range Status   Specimen Description BLOOD  Final   Special Requests NONE  Final   Culture   Final    NO GROWTH 1 DAY Performed at [facility], [address] [place], [place] [address]    Report Status PENDING  Incomplete  Resp Panel by RT-PCR (Flu A&B, Covid) Nasopharyngeal Swab     Status: None   Collection Time: 04/13/21  3:51 AM  Specimen: Nasopharyngeal Swab; Nasopharyngeal(NP) swabs in vial transport medium  Result Value Ref Range Status   SARS Coronavirus 2 by RT PCR NEGATIVE NEGATIVE Final    Comment: (NOTE) SARS-CoV-2 target nucleic acids are NOT DETECTED.  The SARS-CoV-2 RNA is generally detectable in upper respiratory specimens during the acute phase of infection. The lowest concentration of SARS-CoV-2 viral copies this assay can detect is 138 copies/mL. A negative result does not preclude SARS-Cov-2 infection and should not be used as the sole basis for treatment or other patient management decisions. A negative result may occur with  improper specimen collection/handling, submission of specimen other than nasopharyngeal swab, presence of viral mutation(s) within the areas targeted by this assay, and inadequate number of viral copies(<138 copies/mL). A negative result must be combined with clinical observations, patient history, and epidemiological information. The expected result is Negative.  Fact Sheet for Patients:  EntrepreneurPulse.com.au  Fact Sheet for Healthcare Providers:  IncredibleEmployment.be  This test is no t yet approved or cleared by the [place] FDA and  has been authorized for detection and/or diagnosis of SARS-CoV-2 by FDA under an Emergency Use Authorization (EUA). This EUA will remain  in effect (meaning this test can be used) for the duration of the COVID-19 declaration under Section 564(b)(1) of the Act,  21 U.S.C.section 360bbb-3(b)(1), unless the authorization is terminated  or revoked sooner.       Influenza A by PCR NEGATIVE NEGATIVE Final   Influenza B by PCR NEGATIVE NEGATIVE Final    Comment: (NOTE) The Xpert Xpress SARS-CoV-2/FLU/RSV plus assay is intended as an aid in the diagnosis of influenza from Nasopharyngeal swab specimens and should not be used as a sole basis for treatment. Nasal washings and aspirates are unacceptable for Xpert Xpress SARS-CoV-2/FLU/RSV testing.  Fact Sheet for Patients: EntrepreneurPulse.com.au  Fact Sheet for Healthcare Providers: IncredibleEmployment.be  This test is not yet approved or cleared by the [place] FDA and has been authorized for detection and/or diagnosis of SARS-CoV-2 by FDA under an Emergency Use Authorization (EUA). This EUA will remain in effect (meaning this test can be used) for the duration of the COVID-19 declaration under Section 564(b)(1) of the Act, 21 U.S.C. section 360bbb-3(b)(1), unless the authorization is terminated or revoked.  Performed at [facility], [address], [place], [place] [address]   MRSA Next Gen by PCR, Nasal     Status: None   Collection Time: 04/13/21  1:03 PM   Specimen: Nasal Mucosa; Nasal Swab  Result Value Ref Range Status   MRSA by PCR Next Gen NOT DETECTED NOT DETECTED Final    Comment: (NOTE) The GeneXpert MRSA Assay (FDA approved for NASAL specimens only), is one component of a comprehensive MRSA colonization surveillance program. It is not intended to diagnose MRSA infection nor to guide or monitor treatment for MRSA infections. Test performance is not FDA approved in patients less than 54 years old. Performed at [facility], [address] [place], [place] [address]      Labs: Basic Metabolic Panel: Recent Labs  Lab 04/13/21 0021 04/13/21 1633 04/14/21 0414  NA 134* 135 138  K 3.6 3.1* 2.9*  CL 96* 103 109  CO2 '23 22 23   '$ GLUCOSE 126* 91 101*  BUN 62* 59* 47*  CREATININE 2.20* 1.66* 1.33*  CALCIUM 8.6* 7.7* 7.7*  MG  --   --  1.4*   Liver Function Tests: Recent Labs  Lab 04/13/21 0021 04/14/21 0414  AST 26 26  ALT 22 22  ALKPHOS 77 53  BILITOT 1.7* 1.3*  PROT 7.3 5.3*  ALBUMIN 3.4* 2.5*   Recent Labs  Lab 04/13/21 0021  LIPASE 68*   CBC: Recent Labs  Lab 04/13/21 0021 04/14/21 0414  WBC 11.6* 7.2  NEUTROABS 8.8*  --   HGB 14.1 10.6*  HCT 42.5 32.7*  MCV 83.3 84.5  PLT 303 182    CBG: Recent Labs  Lab 04/13/21 0031  GLUCAP 122*    Signed:  Barton Dubois MD.  [facility] 04/14/2021, 5:42 PM

## 2021-04-14 NOTE — Progress Notes (Signed)
PIV removed. Pt belongings returned and dressed. Discharge paperwork and instructions given to pt.

## 2021-04-14 NOTE — TOC Transition Note (Signed)
Transition of Care [facility]) - CM/SW Discharge Note   Patient Details  Name: Debbie Rubio MRN: [id number] Date of Birth: November 05, 1940  Transition of Care [facility]) CM/SW Contact:  Boneta Lucks, RN Phone Number: 04/14/2021, 12:17 PM   Clinical Narrative:   Patient discharging home. PT is recommending HHPT. Son is agreeable with no preferences.  [facility] Lies with [facility]. Added to AVS. Patient needs a rolling walker. Caryl Pina with [facility] will deliver.   Final next level of care: [place] Barriers to Discharge: Barriers Resolved   Patient Goals and CMS Choice Patient states their goals for this hospitalization and ongoing recovery are:: to go home. CMS Medicare.gov Compare Post Acute Care list provided to:: Patient Represenative (must comment) Choice offered to / list presented to : Adult Children  Discharge Placement                Patient to be transferred to facility by: Pat Kocher Name of family member notified: Son Patient and family notified of of transfer: 04/14/21  Discharge Plan and Services                  DME Agency: [facility] Date DME Agency Contacted: 04/14/21 Time DME Agency Contacted: [id number] Representative spoke with at DME Agency: Caryl Pina HH Arranged: PT [place]: [place] Date [place]: 04/14/21 Time [place]: 1137 Representative spoke with at [place]: [place] ([place]) Interventions     Readmission Risk Interventions Readmission Risk Prevention Plan 04/14/2021  Transportation Screening Complete  HRI or [place] Complete  Social Work Consult for [place] Planning/Counseling Complete  Palliative Care Screening Complete  Medication Review Press photographer) Complete  Some recent data might be hidden

## 2021-04-16 LAB — URINE CULTURE
Culture: 30000 — AB
Special Requests: NORMAL

## 2021-04-17 DIAGNOSIS — I482 Chronic atrial fibrillation, unspecified: Secondary | ICD-10-CM | POA: Diagnosis not present

## 2021-04-17 DIAGNOSIS — I10 Essential (primary) hypertension: Secondary | ICD-10-CM | POA: Diagnosis not present

## 2021-04-17 DIAGNOSIS — I6930 Unspecified sequelae of cerebral infarction: Secondary | ICD-10-CM | POA: Diagnosis not present

## 2021-04-17 DIAGNOSIS — I69393 Ataxia following cerebral infarction: Secondary | ICD-10-CM | POA: Diagnosis not present

## 2021-04-17 DIAGNOSIS — Z79899 Other long term (current) drug therapy: Secondary | ICD-10-CM | POA: Diagnosis not present

## 2021-04-18 DIAGNOSIS — R296 Repeated falls: Secondary | ICD-10-CM | POA: Diagnosis not present

## 2021-04-18 DIAGNOSIS — I639 Cerebral infarction, unspecified: Secondary | ICD-10-CM | POA: Diagnosis not present

## 2021-04-18 LAB — CULTURE, BLOOD (ROUTINE X 2)
Culture: NO GROWTH
Culture: NO GROWTH
Special Requests: ADEQUATE

## 2021-04-24 DIAGNOSIS — N939 Abnormal uterine and vaginal bleeding, unspecified: Secondary | ICD-10-CM | POA: Diagnosis not present

## 2021-04-24 DIAGNOSIS — N950 Postmenopausal bleeding: Secondary | ICD-10-CM | POA: Diagnosis not present

## 2021-04-28 ENCOUNTER — Emergency Department (HOSPITAL_COMMUNITY): Payer: PPO

## 2021-04-28 ENCOUNTER — Emergency Department (HOSPITAL_COMMUNITY)
Admission: EM | Admit: 2021-04-28 | Discharge: 2021-04-28 | Disposition: A | Discharge status: Home / Self Care | Payer: PPO | Attending: Emergency Medicine | Admitting: Emergency Medicine

## 2021-04-28 ENCOUNTER — Encounter (HOSPITAL_COMMUNITY): Payer: Self-pay | Admitting: *Deleted

## 2021-04-28 ENCOUNTER — Other Ambulatory Visit: Payer: Self-pay

## 2021-04-28 DIAGNOSIS — Z7901 Long term (current) use of anticoagulants: Secondary | ICD-10-CM | POA: Diagnosis not present

## 2021-04-28 DIAGNOSIS — Z87891 Personal history of nicotine dependence: Secondary | ICD-10-CM | POA: Diagnosis not present

## 2021-04-28 DIAGNOSIS — E876 Hypokalemia: Secondary | ICD-10-CM | POA: Diagnosis not present

## 2021-04-28 DIAGNOSIS — E039 Hypothyroidism, unspecified: Secondary | ICD-10-CM | POA: Diagnosis not present

## 2021-04-28 DIAGNOSIS — Z79899 Other long term (current) drug therapy: Secondary | ICD-10-CM | POA: Insufficient documentation

## 2021-04-28 DIAGNOSIS — Z8679 Personal history of other diseases of the circulatory system: Secondary | ICD-10-CM | POA: Insufficient documentation

## 2021-04-28 DIAGNOSIS — R600 Localized edema: Secondary | ICD-10-CM | POA: Diagnosis not present

## 2021-04-28 DIAGNOSIS — R2242 Localized swelling, mass and lump, left lower limb: Secondary | ICD-10-CM | POA: Diagnosis present

## 2021-04-28 DIAGNOSIS — I10 Essential (primary) hypertension: Secondary | ICD-10-CM | POA: Diagnosis not present

## 2021-04-28 DIAGNOSIS — I517 Cardiomegaly: Secondary | ICD-10-CM | POA: Diagnosis not present

## 2021-04-28 LAB — CBC WITH DIFFERENTIAL/PLATELET
Abs Immature Granulocytes: 0.06 10*3/uL (ref 0.00–0.07)
Basophils Absolute: 0 10*3/uL (ref 0.0–0.1)
Basophils Relative: 0 %
Eosinophils Absolute: 0.1 10*3/uL (ref 0.0–0.5)
Eosinophils Relative: 1 %
HCT: 29.2 % — ABNORMAL LOW (ref 36.0–46.0)
Hemoglobin: 9.2 g/dL — ABNORMAL LOW (ref 12.0–15.0)
Immature Granulocytes: 1 %
Lymphocytes Relative: 15 %
Lymphs Abs: 1.3 10*3/uL (ref 0.7–4.0)
MCH: 27.9 pg (ref 26.0–34.0)
MCHC: 31.5 g/dL (ref 30.0–36.0)
MCV: 88.5 fL (ref 80.0–100.0)
Monocytes Absolute: 0.5 10*3/uL (ref 0.1–1.0)
Monocytes Relative: 6 %
Neutro Abs: 6.4 10*3/uL (ref 1.7–7.7)
Neutrophils Relative %: 77 %
Platelets: 257 10*3/uL (ref 150–400)
RBC: 3.3 MIL/uL — ABNORMAL LOW (ref 3.87–5.11)
RDW: 17.1 % — ABNORMAL HIGH (ref 11.5–15.5)
WBC: 8.4 10*3/uL (ref 4.0–10.5)
nRBC: 0 % (ref 0.0–0.2)

## 2021-04-28 LAB — DIGOXIN LEVEL: Digoxin Level: <0.2 ng/mL — ABNORMAL LOW (ref 0.8–2.0)

## 2021-04-28 LAB — COMPREHENSIVE METABOLIC PANEL
ALT: 21 U/L (ref 0–44)
AST: 21 U/L (ref 15–41)
Albumin: 2.8 g/dL — ABNORMAL LOW (ref 3.5–5.0)
Alkaline Phosphatase: 93 U/L (ref 38–126)
Anion gap: 5 (ref 5–15)
BUN: 10 mg/dL (ref 8–23)
CO2: 25 mmol/L (ref 22–32)
Calcium: 8.1 mg/dL — ABNORMAL LOW (ref 8.9–10.3)
Chloride: 106 mmol/L (ref 98–111)
Creatinine, Ser: 0.66 mg/dL (ref 0.44–1.00)
GFR, Estimated: >60 mL/min (ref >60)
Glucose, Bld: 107 mg/dL — ABNORMAL HIGH (ref 70–99)
Potassium: 2.8 mmol/L — ABNORMAL LOW (ref 3.5–5.1)
Sodium: 136 mmol/L (ref 135–145)
Total Bilirubin: 0.9 mg/dL (ref 0.3–1.2)
Total Protein: 6.2 g/dL — ABNORMAL LOW (ref 6.5–8.1)

## 2021-04-28 LAB — MAGNESIUM: Magnesium: 1.3 mg/dL — ABNORMAL LOW (ref 1.7–2.4)

## 2021-04-28 LAB — PROTIME-INR
INR: 1.1 (ref 0.8–1.2)
Prothrombin Time: 14.1 seconds (ref 11.4–15.2)

## 2021-04-28 LAB — BRAIN NATRIURETIC PEPTIDE: B Natriuretic Peptide: 297 pg/mL — ABNORMAL HIGH (ref 0.0–100.0)

## 2021-04-28 MED ORDER — MAGNESIUM OXIDE -MG SUPPLEMENT 200 MG PO TABS: 200.0000 mg | ORAL_TABLET | Freq: Every day | ORAL | 0 refills | Status: AC

## 2021-04-28 MED ORDER — MAGNESIUM SULFATE 2 GM/50ML IV SOLN
2.0000 g | Freq: Once | INTRAVENOUS | Status: AC
  Administered 2021-04-28: 2 g via INTRAVENOUS
  Filled 2021-04-28: qty 50

## 2021-04-28 MED ORDER — POTASSIUM CHLORIDE CRYS ER 20 MEQ PO TBCR
40.0000 meq | EXTENDED_RELEASE_TABLET | Freq: Once | ORAL | Status: AC
  Administered 2021-04-28: 40 meq via ORAL
  Filled 2021-04-28: qty 2

## 2021-04-28 MED ORDER — FUROSEMIDE 20 MG PO TABS: 20.0000 mg | ORAL_TABLET | Freq: Every day | ORAL | 0 refills | Status: DC

## 2021-04-28 MED ORDER — POTASSIUM CHLORIDE CRYS ER 20 MEQ PO TBCR: 20.0000 meq | EXTENDED_RELEASE_TABLET | Freq: Two times a day (BID) | ORAL | 0 refills | Status: AC

## 2021-04-28 NOTE — ED Triage Notes (Signed)
Swelling of left leg noticed this am

## 2021-04-28 NOTE — Discharge Instructions (Addendum)
Lab work reveals that you have a low magnesium and low potassium, starting on supplement pills please take as prescribed.  Is important that you follow-up with your primary care doctor next 5 to 7 days to recheck your potassium and magnesium level.  Please restart your Eliquis as you are at a high risk of a stroke.  I have also started you on Lasix please take as prescribed for your leg swelling, also recommend keeping that leg elevated while not use, and wear compression stockings.  Come back to the emergency department if you develop chest pain, shortness of breath, severe abdominal pain, uncontrolled nausea, vomiting, diarrhea.

## 2021-04-28 NOTE — ED Provider Notes (Signed)
[facility] Provider Note   CSN: [id number] Arrival date & time: 04/28/21  1128     History Chief Complaint  Patient presents with   Leg Swelling    Debbie Rubio is a 80 y.o. female.  HPI   Patient With significant medical history of A. fib, currently on Eliquis, GERD, hypertension, hypothyroidism presents to the emergency department with chief complaint of left leg swelling.  Patient states that started this morning, she noted that her left leg was slightly larger than her right leg, she denies any pain in her leg, calf pain, paresthesias or weakness, she denies worsening chest pain, shortness of breath, orthopnea, no dyspnea on exertion.  Patient notes that she gets swelling in her legs when she travels a lot, states that she has been in the car a lot recently.  She has no other complaints at this time.  She does endorse that she has stopped taking her Eliquis and digoxin. She was recently  discharge from hospital on the 20th of last month for digoxin overdose, it appears that the digoxin was discontinued but the patient thought her Eliquis was also supposed to discontinue as well.  She does endorse that she has been taking all of her other medications as prescribed.  I spoke with patient's son [name] who confirms with me that the patient has discontinued both medications, I advised him and the patient that they need to restart the Eliquis as she is at an increased risk for a stroke due to her A. fib and previous stroke history.  Son and patient are both in agreement with this. Past Medical History:  Diagnosis Date   Anticoagulated    eliquis--- managed by pcp   Back spasm    LEFT LOWER BACK   Cognitive deficit as late effect of cerebrovascular accident (CVA) 01/2021   Dyspnea    w/ staris / long distance walking, ok with house chores   GERD (gastroesophageal reflux disease)    History of cerebrovascular accident (CVA) with residual deficit 02/15/2021    admission in epic, dx acute large right mca infarts w/ left facial droop, slurred speech, vision decifit with syncope , no acute hemorrhage;  (03-22-2021 per pt son most of speech resolved only a slight residual, droop resolved, but continues with cognitive deficet   Hypertension    Hypothyroidism    followed by pcp   IDA (iron deficiency anemia)    Loss of appetite    since CVA 05/ 2022, per pt son no desire to eat   OA (osteoarthritis)    BILATERAL HANDS,KNEES   Paroxysmal atrial fibrillation ([place])    followed by pcp;  previously followed by dr degent   PMB (postmenopausal bleeding)    Wears dentures    full upper    Patient Active Problem List   Diagnosis Date Noted   Physical deconditioning    Digoxin overdose 04/13/2021   Dehydration 04/13/2021   Mild protein-calorie malnutrition ([place]) 04/13/2021   Recurrent falls 04/13/2021   Stroke ([place]) 02/15/2021   Abnormal uterine bleeding (AUB) 02/04/2018   Influenza B 12/02/2015   Febrile respiratory illness 12/02/2015   Elevated troponin 12/02/2015   AKI (acute kidney injury) ([place]) 12/02/2015   Hypokalemia 12/02/2015   CAP (community acquired pneumonia)    TINNITUS, CHRONIC 08/20/2008   Hypothyroidism 08/04/2008   ATRIAL FIBRILLATION, PAROXYSMAL 08/04/2008    Past Surgical History:  Procedure Laterality Date   DILATION AND CURETTAGE OF UTERUS     EYE SURGERY  BURNED BILATERAL TEAR DUCTS   HYSTEROSCOPY WITH D & C N/A 03/03/2018   Procedure: DILATATION AND CURETTAGE /HYSTEROSCOPY;  Surgeon: Molli Posey, MD;  Location: [facility];  Service: Gynecology;  Laterality: N/A;  patient was originally on 6/3, but was sick   HYSTEROSCOPY WITH D & C N/A 03/23/2021   Procedure: DILATATION AND CURETTAGE /HYSTEROSCOPY;  Surgeon: Molli Posey, MD;  Location: [facility];  Service: Gynecology;  Laterality: N/A;   INTRAUTERINE DEVICE (IUD) INSERTION N/A 03/23/2021   Procedure: POSSIBLE KYLEENA INTRAUTERINE DEVICE (IUD)  INSERTION;  Surgeon: Molli Posey, MD;  Location: [facility];  Service: Gynecology;  Laterality: N/A;   MULTIPLE TOOTH EXTRACTIONS     upper teeth     OB History   No obstetric history on file.     Family History  Problem Relation Age of Onset   Heart attack Father    Heart disease Mother     Social History   Tobacco Use   Smoking status: Former    Packs/day: 0.75    Years: 28.00    Pack years: 21.00    Types: Cigarettes    Quit date: 1988    Years since quitting: 34.6   Smokeless tobacco: Never  Vaping Use   Vaping Use: Never used  Substance Use Topics   Alcohol use: No   Drug use: No    Home Medications Prior to Admission medications   Medication Sig Start Date End Date Taking? Authorizing Provider  furosemide (LASIX) 20 MG tablet Take 1 tablet (20 mg total) by mouth daily for 3 days. 04/28/21 05/01/21 Yes Marcello Fennel, PA-C  Magnesium Oxide 200 MG TABS Take 1 tablet (200 mg total) by mouth daily for 5 days. 04/28/21 05/03/21 Yes Marcello Fennel, PA-C  potassium chloride SA (KLOR-CON) 20 MEQ tablet Take 1 tablet (20 mEq total) by mouth 2 (two) times daily for 5 days. 04/28/21 05/03/21 Yes Marcello Fennel, PA-C  albuterol (PROVENTIL HFA;VENTOLIN HFA) 108 (90 Base) MCG/ACT inhaler Inhale 1-2 puffs into the lungs every 6 (six) hours as needed for wheezing or shortness of breath.    [provider]  apixaban (ELIQUIS) 5 MG TABS tablet Take 1 tablet (5 mg total) by mouth 2 (two) times daily. 02/24/21   Johnson, Clanford L, MD  atorvastatin (LIPITOR) 20 MG tablet Take 1 tablet (20 mg total) by mouth every evening. 02/17/21   Johnson, Clanford L, MD  indapamide (LOZOL) 2.5 MG tablet Take 2.5 mg by mouth See admin instructions. Take 1/2 tablet daily 12/06/20   [provider]  levothyroxine (SYNTHROID) 137 MCG tablet Take 137 mcg by mouth every evening. 01/10/21   [provider]  losartan (COZAAR) 100 MG tablet Take 1 tablet (100  mg total) by mouth every evening. 04/15/21   Barton Dubois, MD  mirtazapine (REMERON) 15 MG tablet Take 15 mg by mouth at bedtime. 04/05/21   [provider]  ondansetron (ZOFRAN ODT) 8 MG disintegrating tablet Take 1 tablet (8 mg total) by mouth every 8 (eight) hours as needed for nausea or vomiting. 04/14/21   Barton Dubois, MD  pantoprazole (PROTONIX) 40 MG tablet Take 1 tablet (40 mg total) by mouth daily with breakfast. 02/18/21 02/18/22  Johnson, Clanford L, MD  pindolol (VISKEN) 5 MG tablet Take 2.5 mg by mouth every evening.    [provider]  sertraline (ZOLOFT) 50 MG tablet Take 1 tablet (50 mg total) by mouth every evening. 04/14/21   Barton Dubois,  MD    Allergies    Beta adrenergic blockers, Flecainide, Levaquin [levofloxacin], Metoprolol succinate, Prednisone, and Pseudoephedrine  Review of Systems   Review of Systems  Constitutional:  Negative for chills and fever.  HENT:  Negative for congestion.   Respiratory:  Negative for shortness of breath.   Cardiovascular:  Positive for leg swelling. Negative for chest pain and palpitations.  Gastrointestinal:  Negative for abdominal pain.  Genitourinary:  Negative for enuresis.  Musculoskeletal:  Negative for back pain.  Skin:  Negative for rash.  Neurological:  Negative for dizziness and light-headedness.  Hematological:  Does not bruise/bleed easily.   Physical Exam Updated Vital Signs BP (!) 156/79   Pulse 78   Temp 98 F (36.7 C) (Oral)   Resp 16   SpO2 98%   Physical Exam Vitals and nursing note reviewed.  Constitutional:      General: She is not in acute distress.    Appearance: She is not ill-appearing.  HENT:     Head: Normocephalic and atraumatic.     Nose: No congestion.  Eyes:     Conjunctiva/sclera: Conjunctivae normal.  Cardiovascular:     Rate and Rhythm: Normal rate and regular rhythm.     Pulses: Normal pulses.     Heart sounds: No murmur heard.   No friction rub. No gallop.   Pulmonary:     Effort: No respiratory distress.     Breath sounds: No wheezing, rhonchi or rales.  Abdominal:     Palpations: Abdomen is soft.     Tenderness: There is no abdominal tenderness.  Musculoskeletal:     Right lower leg: Edema present.     Left lower leg: Edema present.     Comments: Patient has noted pedal edema worse on the left versus the right, 2+ on the left versus 1+ on the right, no chronic lymphedema changes, neurovascular fully intact.  Skin:    General: Skin is warm and dry.  Neurological:     Mental Status: She is alert.  Psychiatric:        Mood and Affect: Mood normal.    ED Results / Procedures / Treatments   Labs (all labs ordered are listed, but only abnormal results are displayed) Labs Reviewed  COMPREHENSIVE METABOLIC PANEL - Abnormal; Notable for the following components:      Result Value   Potassium 2.8 (*)    Glucose, Bld 107 (*)    Calcium 8.1 (*)    Total Protein 6.2 (*)    Albumin 2.8 (*)    All other components within normal limits  CBC WITH DIFFERENTIAL/PLATELET - Abnormal; Notable for the following components:   RBC 3.30 (*)    Hemoglobin 9.2 (*)    HCT 29.2 (*)    RDW 17.1 (*)    All other components within normal limits  BRAIN NATRIURETIC PEPTIDE - Abnormal; Notable for the following components:   B Natriuretic Peptide 297.0 (*)    All other components within normal limits  DIGOXIN LEVEL - Abnormal; Notable for the following components:   Digoxin Level <0.2 (*)    All other components within normal limits  MAGNESIUM - Abnormal; Notable for the following components:   Magnesium 1.3 (*)    All other components within normal limits  PROTIME-INR    EKG EKG Interpretation  Date/Time:  Friday April 28 2021 14:56:43 EDT Ventricular Rate:  69 PR Interval:    QRS Duration: 90 QT Interval:  380 QTC Calculation: 407 R Axis:  72 Text Interpretation: Atrial fibrillation with premature ventricular or aberrantly conducted  complexes Nonspecific T wave abnormality Abnormal ECG Confirmed by Sherwood Gambler [phone]) on 04/28/2021 3:40:54 PM  Radiology DG Chest 2 View  Result Date: 04/28/2021 CLINICAL DATA:  Lower extremity edema. EXAM: CHEST - 2 VIEW COMPARISON:  Single-view of the chest 04/13/2021 and PA and lateral chest 12/02/2015. FINDINGS: The patient is rotated to the right on the study. There is cardiomegaly and aortic atherosclerosis. Lungs are clear. No pneumothorax or pleural effusion. No acute or focal bony abnormality. IMPRESSION: Cardiomegaly without acute disease. Aortic Atherosclerosis (ICD10-I70.0). Electronically Signed   By: Inge Rise M.D.   On: 04/28/2021 14:54   US Venous Img Lower Unilateral Left  Result Date: 04/28/2021 CLINICAL DATA:  Lower extremity edema. EXAM: BILATERAL LOWER EXTREMITY VENOUS DOPPLER ULTRASOUND TECHNIQUE: Gray-scale sonography with compression, as well as color and duplex ultrasound, were performed to evaluate the deep venous system(s) from the level of the common femoral vein through the popliteal and proximal calf veins. COMPARISON:  None. FINDINGS: VENOUS Normal compressibility of the common femoral, superficial femoral, and popliteal veins, as well as the visualized calf veins. Visualized portions of profunda femoral vein and great saphenous vein unremarkable. No filling defects to suggest DVT on grayscale or color Doppler imaging. Doppler waveforms show normal direction of venous flow, normal respiratory plasticity and response to augmentation. Limited views of the contralateral common femoral vein are unremarkable. OTHER None. Limitations: none IMPRESSION: Negative. Electronically Signed   By: Kerby Moors M.D.   On: 04/28/2021 14:38    Procedures Procedures   Medications Ordered in ED Medications  potassium chloride SA (KLOR-CON) CR tablet 40 mEq (40 mEq Oral Given 04/28/21 1646)  magnesium sulfate IVPB 2 g 50 mL (2 g Intravenous New Bag/Given 04/28/21 1646)    ED  Course  I have reviewed the triage vital signs and the nursing notes.  Pertinent labs & imaging results that were available during my care of the patient were reviewed by me and considered in my medical decision making (see chart for details).    MDM Rules/Calculators/A&P                          Initial impression-presents with chief complaint of leg swelling.  She is alert, does not appear to be in acute stress, vital signs reassuring.  Will obtain basic lab work-up, ordered DVT for rule outs, chest x-ray, and reassess.  Work-up-CBC shows normocytic anemia with heme of 9.2, appears to be at baseline, CMP shows hypokalemia of 2.8, hyperglycemia 107, BNP is 297, INR unremarkable, digoxin level less than 2, magnesium 1.3.  Chest x-ray negative for acute findings.  DVT study is negative.  EKG is A. fib without signs of ischemia.  Reassessment-noted that patient has hypokalemia this appears to be same from her previous discharge, will provide her with oral potassium, will also check a magnesium   Magnesium is also low suspect this is the reason for her hypokalemia.  will provide her with 2 g of magnesium IV and discharge her home with potassium and magnesium supplementation.  Spoke with patient's son instructed him that we are going to start her on magnesium and potassium and that they will need to follow-up with their PCP in the next 5 to 7 days for a recheck.  Rule out-I have low suspicion for ACS as patient denies chest pain, shortness of breath, EKG without signs of ischemia.  Low suspicion for  acute CHF exacerbation as BNP is only mildly elevated, no Rales present on exam, chest x-ray negative for pleural effusions.  Low suspicion for DVT as study was negative.  Low suspicion for AAA or aortic dissection as history is atypical, patient has low risk factors.  Low suspicion for PE as patient denies pleuritic chest pain, shortness of breath, vital signs are reassuring.  Low suspicion for systemic  infection as patient is nontoxic-appearing vital signs reassuring, no obvious source infection noted on exam.   Plan-  Left leg swelling-suspect this is acute on chronic lymphedema with possible chronic CHF, will start her on a few days worth of Lasix, recommend elevating her leg, and light compressions. Electrolyte derailments-likely secondary due post viral gastroenteritis, will start her on oral potassium and magnesium, follow-up with PCP for reevaluation.  Vital signs have remained stable, no indication for hospital admission.  Patient discussed with attending and they agreed with assessment and plan.  Patient given at home care as well strict return precautions.  Patient verbalized that they understood agreed to said plan.  Final Clinical Impression(s) / ED Diagnoses Final diagnoses:  Leg edema  Hypokalemia  Hypomagnesemia    Rx / DC Orders ED Discharge Orders          Ordered    furosemide (LASIX) 20 MG tablet  Daily        04/28/21 1758    potassium chloride SA (KLOR-CON) 20 MEQ tablet  2 times daily        04/28/21 1758    Magnesium Oxide 200 MG TABS  Daily        04/28/21 1758             Marcello Fennel, PA-C 04/28/21 1800    Sherwood Gambler, MD 04/29/21 2148

## 2021-05-04 DIAGNOSIS — Z13220 Encounter for screening for lipoid disorders: Secondary | ICD-10-CM | POA: Diagnosis not present

## 2021-05-04 DIAGNOSIS — E876 Hypokalemia: Secondary | ICD-10-CM | POA: Diagnosis not present

## 2021-05-04 DIAGNOSIS — K2100 Gastro-esophageal reflux disease with esophagitis, without bleeding: Secondary | ICD-10-CM | POA: Diagnosis not present

## 2021-05-04 DIAGNOSIS — I10 Essential (primary) hypertension: Secondary | ICD-10-CM | POA: Diagnosis not present

## 2021-05-04 DIAGNOSIS — R790 Abnormal level of blood mineral: Secondary | ICD-10-CM | POA: Diagnosis not present

## 2021-05-04 DIAGNOSIS — E039 Hypothyroidism, unspecified: Secondary | ICD-10-CM | POA: Diagnosis not present

## 2021-05-11 DIAGNOSIS — K2100 Gastro-esophageal reflux disease with esophagitis, without bleeding: Secondary | ICD-10-CM | POA: Diagnosis not present

## 2021-05-11 DIAGNOSIS — E039 Hypothyroidism, unspecified: Secondary | ICD-10-CM | POA: Diagnosis not present

## 2021-05-11 DIAGNOSIS — I480 Paroxysmal atrial fibrillation: Secondary | ICD-10-CM | POA: Diagnosis not present

## 2021-05-11 DIAGNOSIS — F331 Major depressive disorder, recurrent, moderate: Secondary | ICD-10-CM | POA: Diagnosis not present

## 2021-05-11 DIAGNOSIS — Z6826 Body mass index (BMI) 26.0-26.9, adult: Secondary | ICD-10-CM | POA: Diagnosis not present

## 2021-05-11 DIAGNOSIS — M1711 Unilateral primary osteoarthritis, right knee: Secondary | ICD-10-CM | POA: Diagnosis not present

## 2021-05-11 DIAGNOSIS — G8194 Hemiplegia, unspecified affecting left nondominant side: Secondary | ICD-10-CM | POA: Diagnosis not present

## 2021-05-14 DIAGNOSIS — I4891 Unspecified atrial fibrillation: Secondary | ICD-10-CM | POA: Diagnosis not present

## 2021-05-14 DIAGNOSIS — F331 Major depressive disorder, recurrent, moderate: Secondary | ICD-10-CM | POA: Diagnosis not present

## 2021-05-14 DIAGNOSIS — Z6825 Body mass index (BMI) 25.0-25.9, adult: Secondary | ICD-10-CM | POA: Diagnosis not present

## 2021-05-31 ENCOUNTER — Telehealth: Payer: Self-pay | Admitting: Nurse Practitioner

## 2021-05-31 NOTE — Telephone Encounter (Signed)
I attempted to call Ms. Whidden to schedule f/u pc visit, no answer, message left with contact information to return call

## 2021-06-05 ENCOUNTER — Telehealth: Payer: Self-pay | Admitting: Nurse Practitioner

## 2021-06-05 NOTE — Telephone Encounter (Signed)
I called to schedule f/u pc visit, no answer, message left with contact number for Mr. Paskett, Ms. Buchholz son

## 2021-06-07 ENCOUNTER — Other Ambulatory Visit: Payer: Self-pay

## 2021-06-07 ENCOUNTER — Other Ambulatory Visit: Payer: PPO | Admitting: Nurse Practitioner

## 2021-06-07 ENCOUNTER — Encounter: Payer: Self-pay | Admitting: Nurse Practitioner

## 2021-06-07 DIAGNOSIS — Z515 Encounter for palliative care: Secondary | ICD-10-CM

## 2021-06-07 DIAGNOSIS — R630 Anorexia: Secondary | ICD-10-CM

## 2021-06-07 NOTE — Progress Notes (Signed)
[facility] Consult Note Telephone: [phone]  Fax: [phone]    Date of encounter: 06/07/21 5:36 PM PATIENT NAME: Debbie Rubio [address] [place] [address]   [phone] (home)  DOB: Jul 27, 1941 MRN: [id number] PRIMARY CARE PROVIDER:    Caryl Bis, MD,  [address] [place] [address] [phone]  RESPONSIBLE PARTY:    Contact Information     Name Relation Home Work Mobile   KIMBERELY, ARNET [phone]  [phone]   [place], Fong Relative   [phone]      Due to the COVID-19 crisis, this visit was done via telemedicine from my office and it was initiated and consent by this patient and or family.  I connected with  [place] on 06/07/21 by a telephone as video not available enabled telemedicine application and verified that I am speaking with the correct person using two identifiers.   I discussed the limitations of evaluation and management by telemedicine. The patient expressed understanding and agreed to proceed. Palliative Care was asked to follow this patient by consultation request of  Caryl Bis, MD to address advance care planning and complex medical decision making. This is a follow up visit.                                  ASSESSMENT AND PLAN / RECOMMENDATIONS:  Symptom Management/Plan: ACP: Full code; full scope of treamtnet 2. anorexia: improving; no new weight loss. Encourage snacking between meals. Offer nutritional supplement such as Ensure between meals. Continue to offer meals from a variety of food groups. Nutritional counseling done, continue Remeron.  3. Palliative care encounter; Palliative care encounter; Palliative medicine team will continue to support patient, patient's family, and medical team. Visit consisted of counseling and education dealing with the complex and emotionally intense issues of symptom management and palliative care in the setting of serious  and potentially life-threatening illness  Follow up Palliative Care Visit: Palliative care will continue to follow for complex medical decision making, advance care planning, and clarification of goals. Return 8 weeks or prn.  I spent 43 minutes providing this consultation. More than 50% of the time in this consultation was spent in counseling and care coordination. PPS: 60%  Chief Complaint: Follow up palliative consult for complex medical decision making  HISTORY OF PRESENT ILLNESS:  Debbie Rubio is a 80 y.o. year old female  with multiple medical problems significant for paroxysmal Afib, Hypothyroidism, HTN, recent stroke from right middle cerebral artery infarction. Debbie Rubio is s/p hospitalization from 02/15/2021 to 02/17/2021 for CVA. Hospitalized 04/12/2021 to 04/14/2021 for digoxin OD/afib with vomiting. I called Ms. Alia and her son Mr Mowry for telemedicine telephonic as video not available for f/u pc visit. We talked about purpose of pc visit. We talked about how Ms. Daily has been feeling. Ms. Japp endorses she has been doing fine. She continues to drive short distances to church, grocery store. Ms. Hilke is fully independent, bathing, dressing. Going to grocery store, preparing her meals. We talked about appetite which has been improving. No other symptoms reviewed. We talked about her daily routine. We talked about recent hospitalization. We talked about medical goal. We talked about sleep patterns, which Ms. Osterlund endorses she has been sleeping good. Sleep hygiene. We talked about no recent falls, wounds, infections. Mr. Weltzin mentioned more difficulty with memory, recall where she put things, remembering  to take her medications. We talked about Mr. Eberle does prepare he med box per week but sometimes she forgets to take it. We talked about either a med box or reminder clock that verbalizes to take her medications. Will do PC SW consult to see if that is an  option. We talked about memory. Mr. Cresswell endorses no concern of leaving the stove on or getting lost driving. Ms. Marcelo Baldy endorses she feels like she has not experienced those difficulties. We talked about role pc in poc. Life review done as Ms. Muela is new to this provider. Therapeutic listening, emotional support provided. Questions answered.   History obtained from review of EMR, discussion with Mr Deguire, son and Ms. Lisby.  I reviewed available labs, medications, imaging, studies and related documents from the EMR.  Records reviewed and summarized above.   ROS Full 14 system review of systems performed and negative with exception of: as per HPI.   Physical Exam: deferred Questions and concerns were addressed. The patient/family was encouraged to call with questions and/or concerns. My contact information was provided. Provided general support and encouragement, no other unmet needs identified   Thank you for the opportunity to participate in the care of Ms. Breit.  The palliative care team will continue to follow. Please call our office at [phone] if we can be of additional assistance.   This chart was dictated using voice recognition software.  Despite best efforts to proofread,  errors can occur which can change the documentation meaning.   Euretha Najarro Ihor Gully, NP

## 2021-06-12 ENCOUNTER — Telehealth: Payer: Self-pay

## 2021-06-12 NOTE — Telephone Encounter (Signed)
06/12/21 '@11'$ :30 AM: Palliative care SW outreached patients son, [name], per [facility] NP - C. Gusler, request to assess needs.  Son shared that he was looking into pill boxes that have automatic reminders built into them to assist patient with remembering to take her medications.   SW advised son that there are options to explore on [place] Son requested info be emailed to him at  [email or web address]. SW emailed the following options:  New MedQ Daily Pill Box Reminder with Flashing Light and Beeping Alarm [email or web address]   Med-E-Lert Medication Dispenser with Automatic Lock Box 28 Sealed Pill Compartments [email or web address]   No other questions/concerns at this time.

## 2021-06-28 DIAGNOSIS — Z30432 Encounter for removal of intrauterine contraceptive device: Secondary | ICD-10-CM | POA: Diagnosis not present

## 2021-08-10 DIAGNOSIS — I482 Chronic atrial fibrillation, unspecified: Secondary | ICD-10-CM | POA: Diagnosis not present

## 2021-08-10 DIAGNOSIS — Z79890 Hormone replacement therapy (postmenopausal): Secondary | ICD-10-CM | POA: Diagnosis not present

## 2021-08-10 DIAGNOSIS — E039 Hypothyroidism, unspecified: Secondary | ICD-10-CM | POA: Diagnosis not present

## 2021-08-10 DIAGNOSIS — Z7901 Long term (current) use of anticoagulants: Secondary | ICD-10-CM | POA: Diagnosis not present

## 2021-08-10 DIAGNOSIS — I10 Essential (primary) hypertension: Secondary | ICD-10-CM | POA: Diagnosis not present

## 2021-08-10 DIAGNOSIS — F330 Major depressive disorder, recurrent, mild: Secondary | ICD-10-CM | POA: Diagnosis not present

## 2021-08-10 DIAGNOSIS — I69398 Other sequelae of cerebral infarction: Secondary | ICD-10-CM | POA: Diagnosis not present

## 2021-08-10 DIAGNOSIS — Z6828 Body mass index (BMI) 28.0-28.9, adult: Secondary | ICD-10-CM | POA: Diagnosis not present

## 2021-08-10 DIAGNOSIS — F0150 Vascular dementia without behavioral disturbance: Secondary | ICD-10-CM | POA: Diagnosis not present

## 2021-08-15 ENCOUNTER — Other Ambulatory Visit: Payer: Self-pay

## 2021-08-15 ENCOUNTER — Encounter: Payer: Self-pay | Admitting: Nurse Practitioner

## 2021-08-15 ENCOUNTER — Other Ambulatory Visit: Payer: PPO | Admitting: Nurse Practitioner

## 2021-08-15 DIAGNOSIS — R413 Other amnesia: Secondary | ICD-10-CM

## 2021-08-15 DIAGNOSIS — R630 Anorexia: Secondary | ICD-10-CM | POA: Diagnosis not present

## 2021-08-15 DIAGNOSIS — Z515 Encounter for palliative care: Secondary | ICD-10-CM

## 2021-08-15 NOTE — Progress Notes (Signed)
[facility] Consult Note Telephone: [phone]  Fax: [phone]    Date of encounter: 08/15/21 2:53 PM PATIENT NAME: Debbie Rubio [address] [place] [address]   [id number] (home)  DOB: 1940/12/03 MRN: [id number] PRIMARY CARE PROVIDER:    Caryl Bis, MD,  [address] [place] [address] [phone] RESPONSIBLE PARTY:    Contact Information     Name Relation Home Work Mobile   Debbie Rubio [phone]  [phone]   Debbie Rubio Relative   [phone]      Due to the COVID-19 crisis, this visit was done via telemedicine from my office and it was initiated and consent by this patient and or family.  I connected with  Debbie Rubio OR PROXY on 08/15/21 by a telephone as video not available enabled telemedicine application and verified that I am speaking with the correct person using two identifiers.   I discussed the limitations of evaluation and management by telemedicine. The patient expressed understanding and agreed to proceed. Palliative Care was asked to follow this patient by consultation request of  Debbie Bis, MD to address advance care planning and complex medical decision making. This is a follow up visit.                                  ASSESSMENT AND PLAN / RECOMMENDATIONS:  Symptom Management/Plan: ACP: Full code; full scope of treatment  2. Anorexia: improving; no new weight loss. Encourage snacking between meals. Offer nutritional supplement such as Ensure between meals. Continue to offer meals from a variety of food groups. Nutritional counseling done, continue Remeron.   3. Memory impairment secondary to CVA; continue to monitor independence, falls, safety, disease progression.  4. Palliative care encounter; Palliative care encounter; Palliative medicine team will continue to support patient, patient's family, and medical team. Visit consisted of counseling and education  dealing with the complex and emotionally intense issues of symptom management and palliative care in the setting of serious and potentially life-threatening illness  Follow up Palliative Care Visit: Palliative care will continue to follow for complex medical decision making, advance care planning, and clarification of goals. Return 8 weeks or prn.  I spent 36 minutes providing this consultation. More than 50% of the time in this consultation was spent in counseling and care coordination. PPS: 50%  HOSPICE ELIGIBILITY/DIAGNOSIS: TBD  Chief Complaint: Follow up palliative consult for complex medical decision making  HISTORY OF PRESENT ILLNESS:  Debbie Rubio is a 80 y.o. year old female  with multiple medical problems significant for paroxysmal Afib, Hypothyroidism, HTN, recent stroke from right middle cerebral artery infarction. I called Debbie Rubio and her son Debbie Rubio for telemedicine telephonic as video not available for f/u pc visit. We talked about purpose of pc visit. We talked about how Debbie Rubio has been feeling. Debbie Rubio endorses she has been doing fine. Debbie Rubio and I talked about symptoms of pain which she denies. We talked about shortness of breath. We talked about how Debbie Rubio talked about baseline. We talked about her appetite which is good. We talked about no recent hospitalization, falls, infections, wounds. Debbie Rubio endorses she continues to drive to her son's home which is about 10 miles away, the grocery store. We talked about [name] her daily routine. Debbie Rubio endorses she was playing cards with her friend. We talked about her medications.  Debbie Rubio endorses she filled her med box. We talked about a automatic dispenser. Debbie Rubio endorses she did not need that. We talked about medications. We talked about medical goals. Debbie Rubio endorses she has no needs or concerns. Emotional support provided. We talked about f/u pc visit, Debbie Rubio in  agreement. Debbie Rubio son, Debbie Rubio. We talked about pc visit with Debbie Rubio. We talked about symptoms, appetite, Debbie Rubio daily routine. We talked about increase in memory problems, driving. Debbie Rubio talked about Debbie Rubio last week went to the [employer] where she has gone for many years, she had difficulty finding. We talked about safety. We talked about medications. We talked about Debbie Rubio being independent. Debbie Rubio lives by herself. Debbie Rubio is able to bath, get dressed. We talked about future planning. We talked about medical goals. We talked about role pc in poc. We talked about f/u pc visit, Debbie Rubio in agreement. Scheduled. Therapeutic listening, emotional support provided. Questions answered.   History obtained from review of EMR, discussion with Debbie Rubio. And Debbie Rubio son on separate telephone discussion I reviewed available labs, medications, imaging, studies and related documents from the EMR.  Records reviewed and summarized above.   ROS Full 10 system review of systems performed and negative with exception of: as per HPI.   Physical Exam: deferred Questions and concerns were addressed. The patient/family was encouraged to call with questions and/or concerns. My business card was provided. Provided general support and encouragement, no other unmet needs identified   Thank you for the opportunity to participate in the care of Ms. Carra.  The palliative care team will continue to follow. Please call our office at [phone] if we can be of additional assistance.   This chart was dictated using voice recognition software.  Despite best efforts to proofread,  errors can occur which can change the documentation meaning.   Debbie Rubio Ihor Gully, NP

## 2021-08-23 DIAGNOSIS — R059 Cough, unspecified: Secondary | ICD-10-CM | POA: Diagnosis not present

## 2021-09-08 DIAGNOSIS — E876 Hypokalemia: Secondary | ICD-10-CM | POA: Diagnosis not present

## 2021-09-08 DIAGNOSIS — I10 Essential (primary) hypertension: Secondary | ICD-10-CM | POA: Diagnosis not present

## 2021-09-08 DIAGNOSIS — Z13220 Encounter for screening for lipoid disorders: Secondary | ICD-10-CM | POA: Diagnosis not present

## 2021-09-08 DIAGNOSIS — I4891 Unspecified atrial fibrillation: Secondary | ICD-10-CM | POA: Diagnosis not present

## 2021-09-08 DIAGNOSIS — K219 Gastro-esophageal reflux disease without esophagitis: Secondary | ICD-10-CM | POA: Diagnosis not present

## 2021-09-08 DIAGNOSIS — E039 Hypothyroidism, unspecified: Secondary | ICD-10-CM | POA: Diagnosis not present

## 2021-09-08 DIAGNOSIS — R790 Abnormal level of blood mineral: Secondary | ICD-10-CM | POA: Diagnosis not present

## 2021-09-11 DIAGNOSIS — G8194 Hemiplegia, unspecified affecting left nondominant side: Secondary | ICD-10-CM | POA: Diagnosis not present

## 2021-09-11 DIAGNOSIS — K2100 Gastro-esophageal reflux disease with esophagitis, without bleeding: Secondary | ICD-10-CM | POA: Diagnosis not present

## 2021-09-11 DIAGNOSIS — M1711 Unilateral primary osteoarthritis, right knee: Secondary | ICD-10-CM | POA: Diagnosis not present

## 2021-09-11 DIAGNOSIS — E039 Hypothyroidism, unspecified: Secondary | ICD-10-CM | POA: Diagnosis not present

## 2021-09-11 DIAGNOSIS — F331 Major depressive disorder, recurrent, moderate: Secondary | ICD-10-CM | POA: Diagnosis not present

## 2021-09-11 DIAGNOSIS — F0150 Vascular dementia without behavioral disturbance: Secondary | ICD-10-CM | POA: Diagnosis not present

## 2021-09-11 DIAGNOSIS — I480 Paroxysmal atrial fibrillation: Secondary | ICD-10-CM | POA: Diagnosis not present

## 2021-10-02 DIAGNOSIS — L814 Other melanin hyperpigmentation: Secondary | ICD-10-CM | POA: Diagnosis not present

## 2021-10-02 DIAGNOSIS — L821 Other seborrheic keratosis: Secondary | ICD-10-CM | POA: Diagnosis not present

## 2021-10-02 DIAGNOSIS — D225 Melanocytic nevi of trunk: Secondary | ICD-10-CM | POA: Diagnosis not present

## 2021-10-02 DIAGNOSIS — L988 Other specified disorders of the skin and subcutaneous tissue: Secondary | ICD-10-CM | POA: Diagnosis not present

## 2021-10-02 DIAGNOSIS — L718 Other rosacea: Secondary | ICD-10-CM | POA: Diagnosis not present

## 2021-10-18 ENCOUNTER — Telehealth: Payer: Self-pay

## 2021-10-18 NOTE — Telephone Encounter (Signed)
41 am.  Phone call made to patient to complete a check-in and offer a visit from our NP. No answer.  Message left requesting a call back.

## 2021-11-07 DIAGNOSIS — F330 Major depressive disorder, recurrent, mild: Secondary | ICD-10-CM | POA: Diagnosis not present

## 2021-11-07 DIAGNOSIS — I482 Chronic atrial fibrillation, unspecified: Secondary | ICD-10-CM | POA: Diagnosis not present

## 2021-11-07 DIAGNOSIS — I69354 Hemiplegia and hemiparesis following cerebral infarction affecting left non-dominant side: Secondary | ICD-10-CM | POA: Diagnosis not present

## 2021-11-07 DIAGNOSIS — F0150 Vascular dementia without behavioral disturbance: Secondary | ICD-10-CM | POA: Diagnosis not present

## 2021-11-07 DIAGNOSIS — Z7901 Long term (current) use of anticoagulants: Secondary | ICD-10-CM | POA: Diagnosis not present

## 2021-11-07 DIAGNOSIS — I69398 Other sequelae of cerebral infarction: Secondary | ICD-10-CM | POA: Diagnosis not present

## 2021-11-29 IMAGING — CT CT HEAD W/O CM
3 series · 15 of 47 positions shown, 18 images · non-contrast
Comparison: CT head dated 02/15/2021

CLINICAL DATA: Multiple falls, vomiting

EXAM:
CT HEAD WITHOUT CONTRAST
CT MAXILLOFACIAL WITHOUT CONTRAST
TECHNIQUE: Multidetector CT imaging of the head and maxillofacial structures
were performed using the standard protocol without intravenous
contrast. Multiplanar CT image reconstructions of the maxillofacial
structures were also generated.

[Series 2: head w o · axial · 0.40mm/px · z∈[+45,+170]mm · 9 of 30 slices shown, 12 images]
[im 3/30  brain]
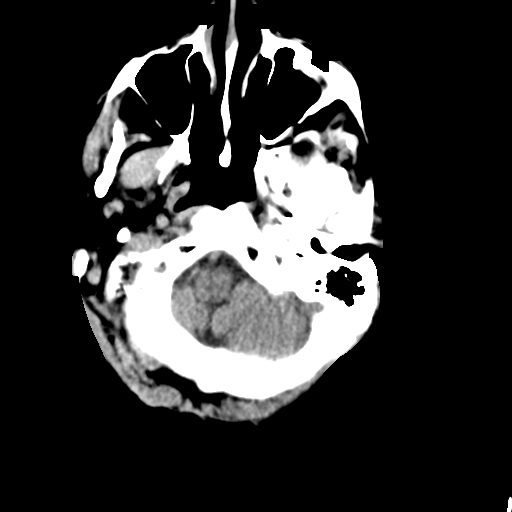
[im 3/30  bone]
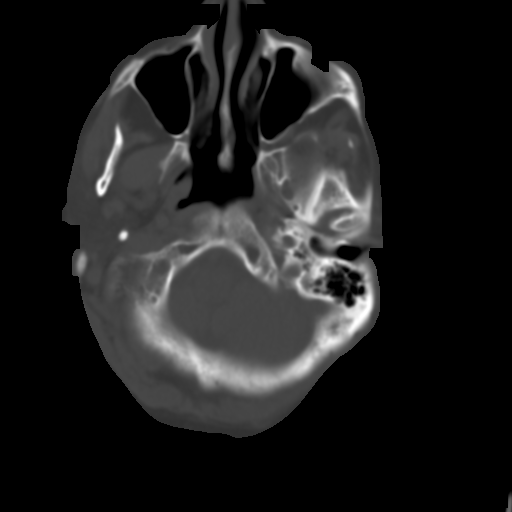
[im 6/30  brain]
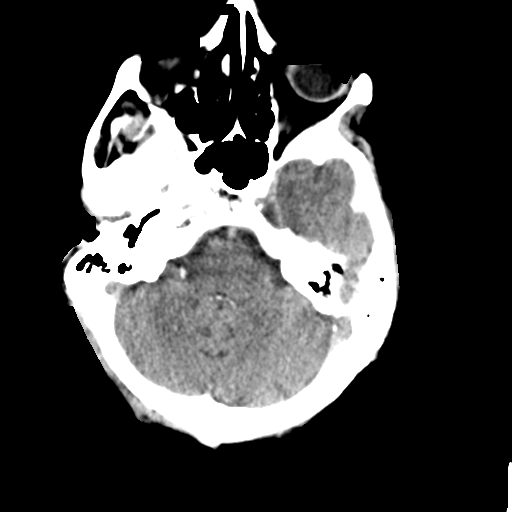
[im 9/30  brain]
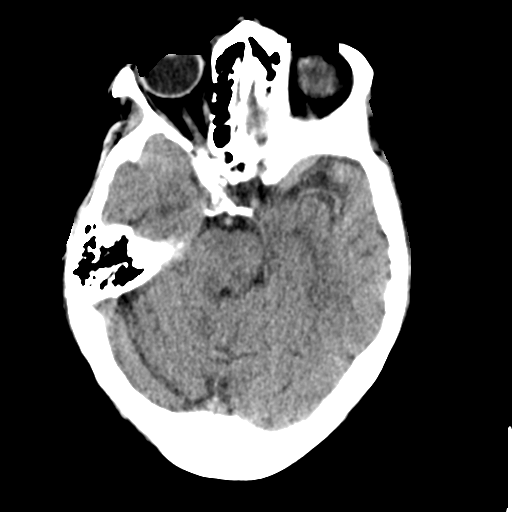
[im 12/30  brain]
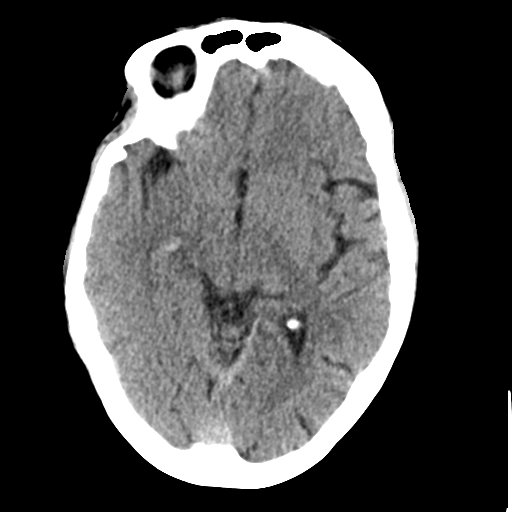
[im 16/30  brain]
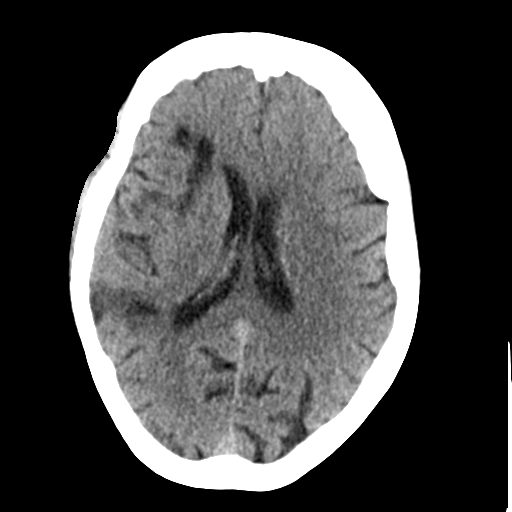
[im 16/30  bone]
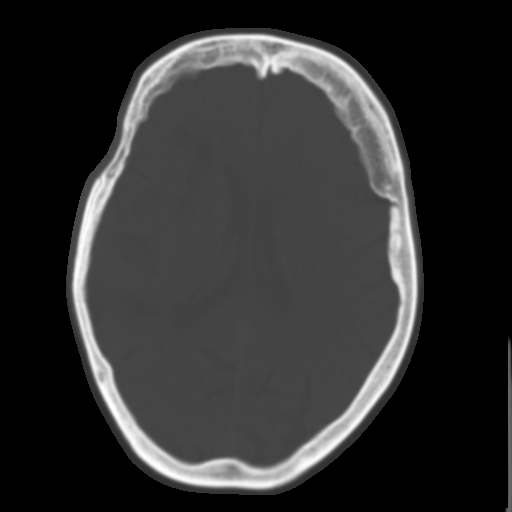
[im 19/30  brain]
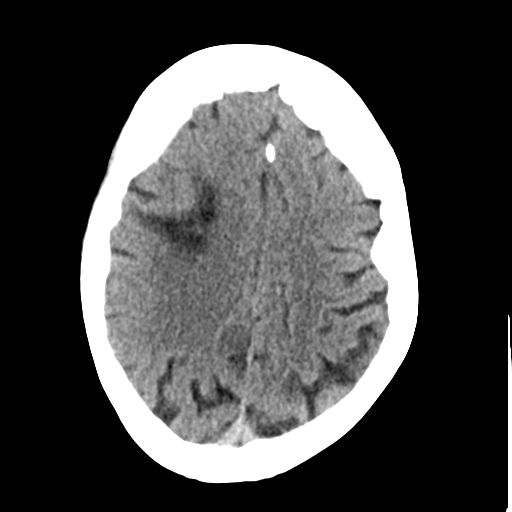
[im 22/30  brain]
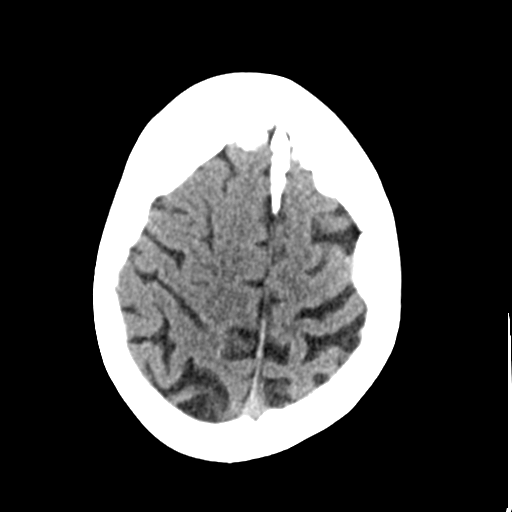
[im 25/30  brain]
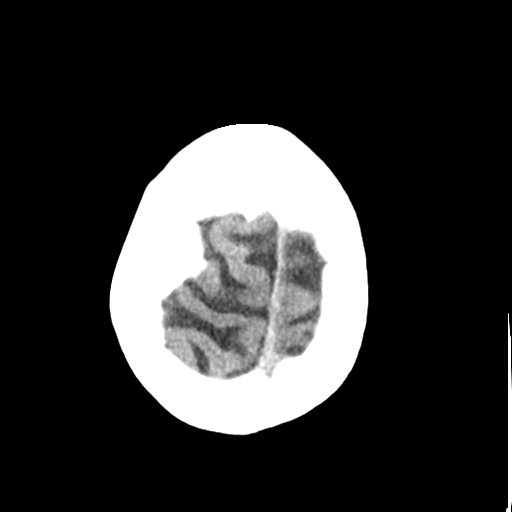
[im 28/30  brain]
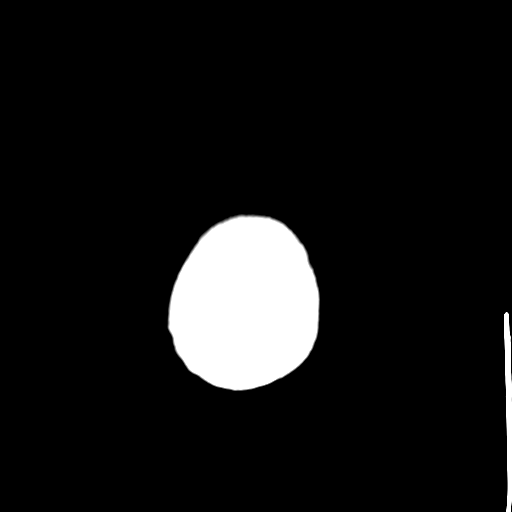
[im 28/30  bone]
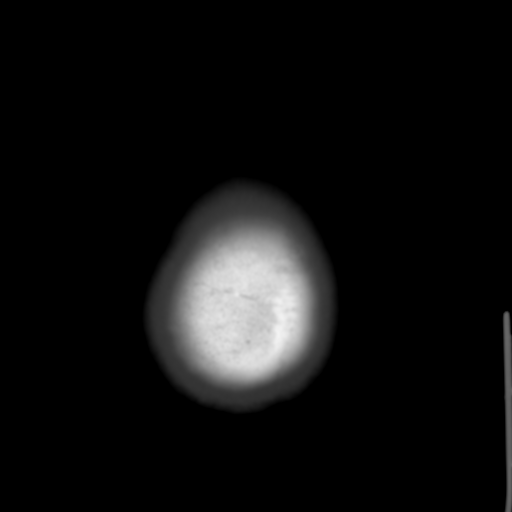

[Series 4: coronal soft · coronal · 0.29mm/px · 3 of 68 slices shown]
[im 23/68  brain]
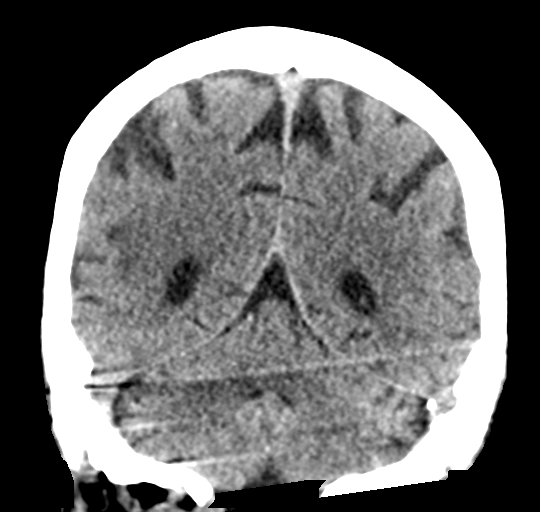
[im 30/68  brain]
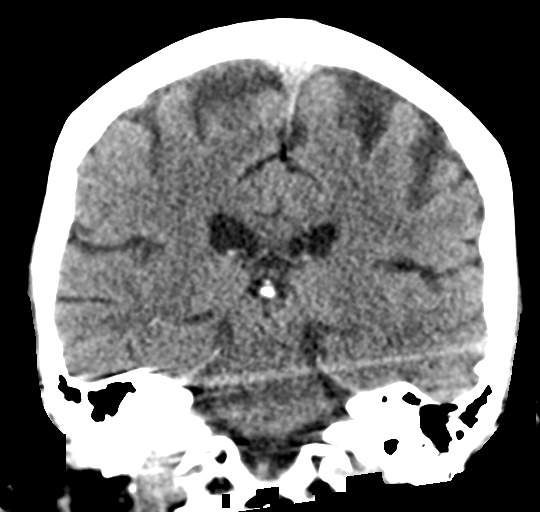
[im 38/68  brain]
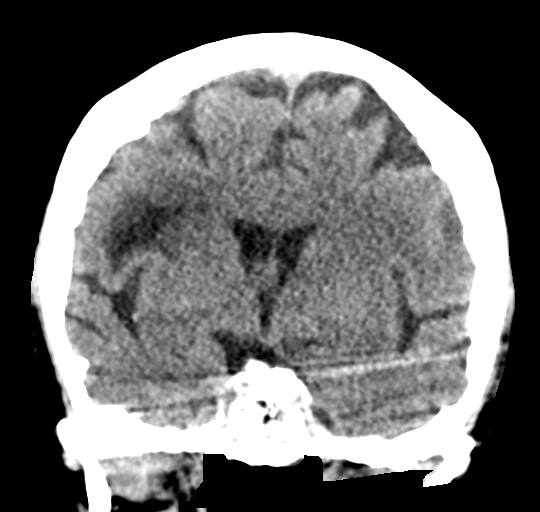

[Series 5: sagittal soft · sagittal · 0.29mm/px · 3 of 51 slices shown]
[im 17/51  brain]
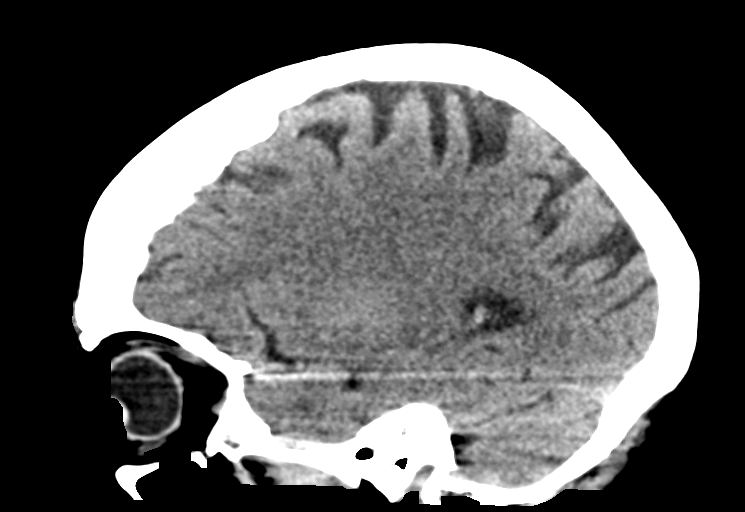
[im 26/51  brain]
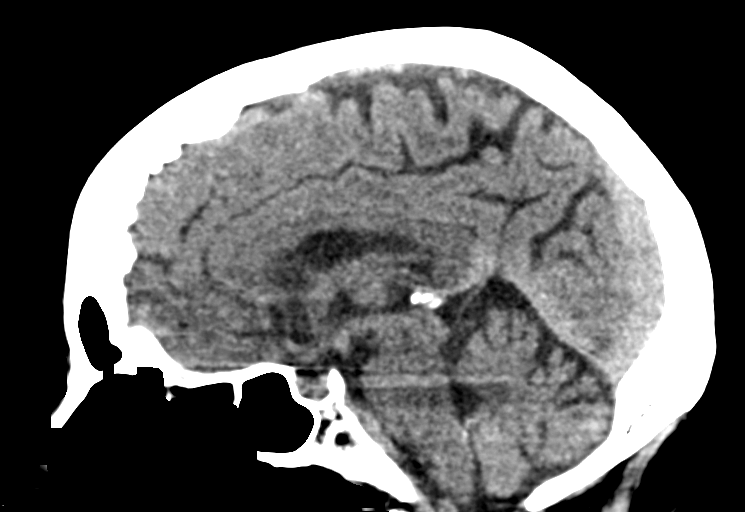
[im 34/51  brain]
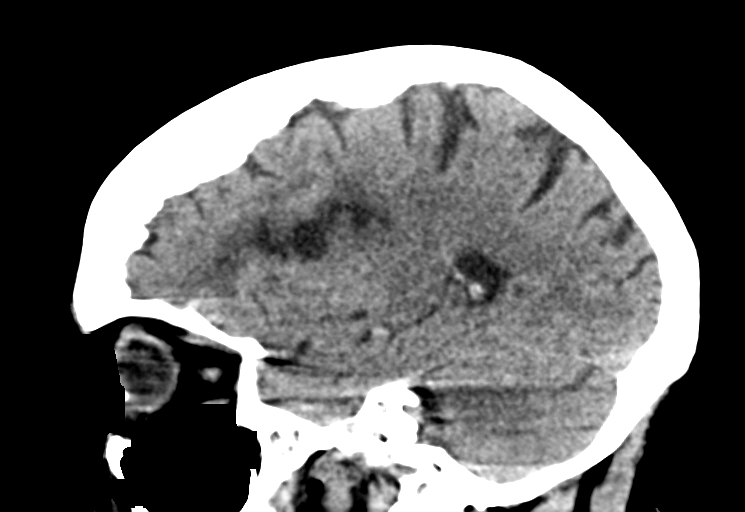

[15 of 47 positions shown; findings below may reference images not displayed]

FINDINGS: CT HEAD FINDINGS

Brain: No evidence of acute infarction, hemorrhage, hydrocephalus,
extra-axial collection or mass lesion/mass effect.

Encephalomalacic changes related to old right MCA distribution
infarct in the right frontoparietal region and anterior temporal
lobe.

Vascular: Intracranial atherosclerosis.

Skull: Normal. Negative for fracture or focal lesion.

Other: None.

CT MAXILLOFACIAL FINDINGS

Osseous: No evidence of maxillofacial fracture. Mandible is intact.
Bilateral mandibular condyles are well-seated in the TMJs.

Orbits: Bilateral orbits, including the globes and retroconal soft
tissues, are within normal limits.

Sinuses: Visualized paranasal sinuses are clear.

Soft tissues: Negative.
IMPRESSION: No evidence of acute intracranial abnormality. Old right MCA
distribution infarct.

No evidence of maxillofacial fracture.

## 2022-01-08 DIAGNOSIS — Z13220 Encounter for screening for lipoid disorders: Secondary | ICD-10-CM | POA: Diagnosis not present

## 2022-01-08 DIAGNOSIS — E876 Hypokalemia: Secondary | ICD-10-CM | POA: Diagnosis not present

## 2022-01-08 DIAGNOSIS — K2100 Gastro-esophageal reflux disease with esophagitis, without bleeding: Secondary | ICD-10-CM | POA: Diagnosis not present

## 2022-01-08 DIAGNOSIS — E039 Hypothyroidism, unspecified: Secondary | ICD-10-CM | POA: Diagnosis not present

## 2022-01-08 DIAGNOSIS — I10 Essential (primary) hypertension: Secondary | ICD-10-CM | POA: Diagnosis not present

## 2022-01-08 DIAGNOSIS — R790 Abnormal level of blood mineral: Secondary | ICD-10-CM | POA: Diagnosis not present

## 2022-01-10 DIAGNOSIS — Z6831 Body mass index (BMI) 31.0-31.9, adult: Secondary | ICD-10-CM | POA: Diagnosis not present

## 2022-01-10 DIAGNOSIS — F331 Major depressive disorder, recurrent, moderate: Secondary | ICD-10-CM | POA: Diagnosis not present

## 2022-01-10 DIAGNOSIS — I480 Paroxysmal atrial fibrillation: Secondary | ICD-10-CM | POA: Diagnosis not present

## 2022-01-10 DIAGNOSIS — E039 Hypothyroidism, unspecified: Secondary | ICD-10-CM | POA: Diagnosis not present

## 2022-01-10 DIAGNOSIS — M1711 Unilateral primary osteoarthritis, right knee: Secondary | ICD-10-CM | POA: Diagnosis not present

## 2022-01-10 DIAGNOSIS — F0150 Vascular dementia without behavioral disturbance: Secondary | ICD-10-CM | POA: Diagnosis not present

## 2022-01-10 DIAGNOSIS — G8194 Hemiplegia, unspecified affecting left nondominant side: Secondary | ICD-10-CM | POA: Diagnosis not present

## 2022-01-10 DIAGNOSIS — K2100 Gastro-esophageal reflux disease with esophagitis, without bleeding: Secondary | ICD-10-CM | POA: Diagnosis not present

## 2022-01-17 DIAGNOSIS — E039 Hypothyroidism, unspecified: Secondary | ICD-10-CM | POA: Diagnosis not present

## 2022-01-17 DIAGNOSIS — F330 Major depressive disorder, recurrent, mild: Secondary | ICD-10-CM | POA: Diagnosis not present

## 2022-01-17 DIAGNOSIS — Z7901 Long term (current) use of anticoagulants: Secondary | ICD-10-CM | POA: Diagnosis not present

## 2022-01-17 DIAGNOSIS — I482 Chronic atrial fibrillation, unspecified: Secondary | ICD-10-CM | POA: Diagnosis not present

## 2022-01-17 DIAGNOSIS — I10 Essential (primary) hypertension: Secondary | ICD-10-CM | POA: Diagnosis not present

## 2022-01-17 DIAGNOSIS — Z79890 Hormone replacement therapy (postmenopausal): Secondary | ICD-10-CM | POA: Diagnosis not present

## 2022-01-17 DIAGNOSIS — Z6830 Body mass index (BMI) 30.0-30.9, adult: Secondary | ICD-10-CM | POA: Diagnosis not present

## 2022-02-07 ENCOUNTER — Telehealth: Payer: Self-pay

## 2022-02-07 NOTE — Telephone Encounter (Signed)
841 am.  Follow up call made to patient to complete a telephonic visit/offer home visit.  No answer.  Message left requesting a call back.  ?

## 2022-03-29 DIAGNOSIS — R790 Abnormal level of blood mineral: Secondary | ICD-10-CM | POA: Diagnosis not present

## 2022-03-29 DIAGNOSIS — E039 Hypothyroidism, unspecified: Secondary | ICD-10-CM | POA: Diagnosis not present

## 2022-03-29 DIAGNOSIS — Z13220 Encounter for screening for lipoid disorders: Secondary | ICD-10-CM | POA: Diagnosis not present

## 2022-03-29 DIAGNOSIS — R5383 Other fatigue: Secondary | ICD-10-CM | POA: Diagnosis not present

## 2022-04-02 ENCOUNTER — Telehealth: Payer: Self-pay | Admitting: Nurse Practitioner

## 2022-04-02 NOTE — Telephone Encounter (Signed)
I called Debbie Rubio, no answer, message left with contact information

## 2022-04-03 DIAGNOSIS — F0150 Vascular dementia without behavioral disturbance: Secondary | ICD-10-CM | POA: Diagnosis not present

## 2022-04-03 DIAGNOSIS — Z0001 Encounter for general adult medical examination with abnormal findings: Secondary | ICD-10-CM | POA: Diagnosis not present

## 2022-04-03 DIAGNOSIS — G8194 Hemiplegia, unspecified affecting left nondominant side: Secondary | ICD-10-CM | POA: Diagnosis not present

## 2022-04-03 DIAGNOSIS — K2100 Gastro-esophageal reflux disease with esophagitis, without bleeding: Secondary | ICD-10-CM | POA: Diagnosis not present

## 2022-04-03 DIAGNOSIS — F331 Major depressive disorder, recurrent, moderate: Secondary | ICD-10-CM | POA: Diagnosis not present

## 2022-04-03 DIAGNOSIS — E039 Hypothyroidism, unspecified: Secondary | ICD-10-CM | POA: Diagnosis not present

## 2022-04-03 DIAGNOSIS — Z6829 Body mass index (BMI) 29.0-29.9, adult: Secondary | ICD-10-CM | POA: Diagnosis not present

## 2022-04-03 DIAGNOSIS — I480 Paroxysmal atrial fibrillation: Secondary | ICD-10-CM | POA: Diagnosis not present

## 2022-04-03 DIAGNOSIS — M1711 Unilateral primary osteoarthritis, right knee: Secondary | ICD-10-CM | POA: Diagnosis not present

## 2022-04-19 DIAGNOSIS — I69398 Other sequelae of cerebral infarction: Secondary | ICD-10-CM | POA: Diagnosis not present

## 2022-04-19 DIAGNOSIS — I10 Essential (primary) hypertension: Secondary | ICD-10-CM | POA: Diagnosis not present

## 2022-04-19 DIAGNOSIS — I482 Chronic atrial fibrillation, unspecified: Secondary | ICD-10-CM | POA: Diagnosis not present

## 2022-04-19 DIAGNOSIS — Z7901 Long term (current) use of anticoagulants: Secondary | ICD-10-CM | POA: Diagnosis not present

## 2022-04-19 DIAGNOSIS — Z6828 Body mass index (BMI) 28.0-28.9, adult: Secondary | ICD-10-CM | POA: Diagnosis not present

## 2022-04-19 DIAGNOSIS — F0150 Vascular dementia without behavioral disturbance: Secondary | ICD-10-CM | POA: Diagnosis not present

## 2022-04-23 DIAGNOSIS — E039 Hypothyroidism, unspecified: Secondary | ICD-10-CM | POA: Diagnosis not present

## 2022-04-23 DIAGNOSIS — I10 Essential (primary) hypertension: Secondary | ICD-10-CM | POA: Diagnosis not present

## 2022-04-23 DIAGNOSIS — I4891 Unspecified atrial fibrillation: Secondary | ICD-10-CM | POA: Diagnosis not present

## 2022-07-18 DIAGNOSIS — Z7901 Long term (current) use of anticoagulants: Secondary | ICD-10-CM | POA: Diagnosis not present

## 2022-07-18 DIAGNOSIS — I482 Chronic atrial fibrillation, unspecified: Secondary | ICD-10-CM | POA: Diagnosis not present

## 2022-08-02 DIAGNOSIS — E876 Hypokalemia: Secondary | ICD-10-CM | POA: Diagnosis not present

## 2022-08-02 DIAGNOSIS — E782 Mixed hyperlipidemia: Secondary | ICD-10-CM | POA: Diagnosis not present

## 2022-08-02 DIAGNOSIS — K2100 Gastro-esophageal reflux disease with esophagitis, without bleeding: Secondary | ICD-10-CM | POA: Diagnosis not present

## 2022-08-02 DIAGNOSIS — E039 Hypothyroidism, unspecified: Secondary | ICD-10-CM | POA: Diagnosis not present

## 2022-08-02 DIAGNOSIS — E7849 Other hyperlipidemia: Secondary | ICD-10-CM | POA: Diagnosis not present

## 2022-08-06 DIAGNOSIS — G8194 Hemiplegia, unspecified affecting left nondominant side: Secondary | ICD-10-CM | POA: Diagnosis not present

## 2022-08-06 DIAGNOSIS — I480 Paroxysmal atrial fibrillation: Secondary | ICD-10-CM | POA: Diagnosis not present

## 2022-08-06 DIAGNOSIS — M1711 Unilateral primary osteoarthritis, right knee: Secondary | ICD-10-CM | POA: Diagnosis not present

## 2022-08-06 DIAGNOSIS — Z23 Encounter for immunization: Secondary | ICD-10-CM | POA: Diagnosis not present

## 2022-08-06 DIAGNOSIS — R030 Elevated blood-pressure reading, without diagnosis of hypertension: Secondary | ICD-10-CM | POA: Diagnosis not present

## 2022-08-06 DIAGNOSIS — F331 Major depressive disorder, recurrent, moderate: Secondary | ICD-10-CM | POA: Diagnosis not present

## 2022-08-06 DIAGNOSIS — Z6830 Body mass index (BMI) 30.0-30.9, adult: Secondary | ICD-10-CM | POA: Diagnosis not present

## 2022-08-06 DIAGNOSIS — E039 Hypothyroidism, unspecified: Secondary | ICD-10-CM | POA: Diagnosis not present

## 2022-08-06 DIAGNOSIS — F0150 Vascular dementia without behavioral disturbance: Secondary | ICD-10-CM | POA: Diagnosis not present

## 2022-08-06 DIAGNOSIS — K2100 Gastro-esophageal reflux disease with esophagitis, without bleeding: Secondary | ICD-10-CM | POA: Diagnosis not present

## 2022-12-03 DIAGNOSIS — K2100 Gastro-esophageal reflux disease with esophagitis, without bleeding: Secondary | ICD-10-CM | POA: Diagnosis not present

## 2022-12-03 DIAGNOSIS — E039 Hypothyroidism, unspecified: Secondary | ICD-10-CM | POA: Diagnosis not present

## 2022-12-03 DIAGNOSIS — I10 Essential (primary) hypertension: Secondary | ICD-10-CM | POA: Diagnosis not present

## 2022-12-03 DIAGNOSIS — R739 Hyperglycemia, unspecified: Secondary | ICD-10-CM | POA: Diagnosis not present

## 2022-12-03 DIAGNOSIS — E7849 Other hyperlipidemia: Secondary | ICD-10-CM | POA: Diagnosis not present

## 2022-12-13 DIAGNOSIS — E039 Hypothyroidism, unspecified: Secondary | ICD-10-CM | POA: Diagnosis not present

## 2022-12-13 DIAGNOSIS — F331 Major depressive disorder, recurrent, moderate: Secondary | ICD-10-CM | POA: Diagnosis not present

## 2022-12-13 DIAGNOSIS — Z6830 Body mass index (BMI) 30.0-30.9, adult: Secondary | ICD-10-CM | POA: Diagnosis not present

## 2022-12-13 DIAGNOSIS — G8194 Hemiplegia, unspecified affecting left nondominant side: Secondary | ICD-10-CM | POA: Diagnosis not present

## 2022-12-13 DIAGNOSIS — M1711 Unilateral primary osteoarthritis, right knee: Secondary | ICD-10-CM | POA: Diagnosis not present

## 2022-12-13 DIAGNOSIS — I482 Chronic atrial fibrillation, unspecified: Secondary | ICD-10-CM | POA: Diagnosis not present

## 2022-12-13 DIAGNOSIS — K2100 Gastro-esophageal reflux disease with esophagitis, without bleeding: Secondary | ICD-10-CM | POA: Diagnosis not present

## 2022-12-13 DIAGNOSIS — F0150 Vascular dementia without behavioral disturbance: Secondary | ICD-10-CM | POA: Diagnosis not present

## 2022-12-13 DIAGNOSIS — R030 Elevated blood-pressure reading, without diagnosis of hypertension: Secondary | ICD-10-CM | POA: Diagnosis not present

## 2022-12-23 DIAGNOSIS — I10 Essential (primary) hypertension: Secondary | ICD-10-CM | POA: Diagnosis not present

## 2022-12-23 DIAGNOSIS — I4891 Unspecified atrial fibrillation: Secondary | ICD-10-CM | POA: Diagnosis not present

## 2022-12-23 DIAGNOSIS — E039 Hypothyroidism, unspecified: Secondary | ICD-10-CM | POA: Diagnosis not present

## 2023-04-24 DIAGNOSIS — E782 Mixed hyperlipidemia: Secondary | ICD-10-CM | POA: Diagnosis not present

## 2023-04-24 DIAGNOSIS — I4891 Unspecified atrial fibrillation: Secondary | ICD-10-CM | POA: Diagnosis not present

## 2023-04-24 DIAGNOSIS — E039 Hypothyroidism, unspecified: Secondary | ICD-10-CM | POA: Diagnosis not present

## 2023-05-02 DIAGNOSIS — Z1321 Encounter for screening for nutritional disorder: Secondary | ICD-10-CM | POA: Diagnosis not present

## 2023-05-02 DIAGNOSIS — E7849 Other hyperlipidemia: Secondary | ICD-10-CM | POA: Diagnosis not present

## 2023-05-02 DIAGNOSIS — R5383 Other fatigue: Secondary | ICD-10-CM | POA: Diagnosis not present

## 2023-05-02 DIAGNOSIS — R739 Hyperglycemia, unspecified: Secondary | ICD-10-CM | POA: Diagnosis not present

## 2023-05-02 DIAGNOSIS — K219 Gastro-esophageal reflux disease without esophagitis: Secondary | ICD-10-CM | POA: Diagnosis not present

## 2023-05-02 DIAGNOSIS — E039 Hypothyroidism, unspecified: Secondary | ICD-10-CM | POA: Diagnosis not present

## 2023-05-02 DIAGNOSIS — E876 Hypokalemia: Secondary | ICD-10-CM | POA: Diagnosis not present

## 2023-05-02 DIAGNOSIS — I10 Essential (primary) hypertension: Secondary | ICD-10-CM | POA: Diagnosis not present

## 2023-06-18 DIAGNOSIS — M1711 Unilateral primary osteoarthritis, right knee: Secondary | ICD-10-CM | POA: Diagnosis not present

## 2023-06-18 DIAGNOSIS — K2100 Gastro-esophageal reflux disease with esophagitis, without bleeding: Secondary | ICD-10-CM | POA: Diagnosis not present

## 2023-06-18 DIAGNOSIS — R030 Elevated blood-pressure reading, without diagnosis of hypertension: Secondary | ICD-10-CM | POA: Diagnosis not present

## 2023-06-18 DIAGNOSIS — F0150 Vascular dementia without behavioral disturbance: Secondary | ICD-10-CM | POA: Diagnosis not present

## 2023-06-18 DIAGNOSIS — I482 Chronic atrial fibrillation, unspecified: Secondary | ICD-10-CM | POA: Diagnosis not present

## 2023-06-18 DIAGNOSIS — Z0001 Encounter for general adult medical examination with abnormal findings: Secondary | ICD-10-CM | POA: Diagnosis not present

## 2023-06-18 DIAGNOSIS — G8194 Hemiplegia, unspecified affecting left nondominant side: Secondary | ICD-10-CM | POA: Diagnosis not present

## 2023-06-18 DIAGNOSIS — Z23 Encounter for immunization: Secondary | ICD-10-CM | POA: Diagnosis not present

## 2023-06-18 DIAGNOSIS — E039 Hypothyroidism, unspecified: Secondary | ICD-10-CM | POA: Diagnosis not present

## 2023-06-18 DIAGNOSIS — D649 Anemia, unspecified: Secondary | ICD-10-CM | POA: Diagnosis not present

## 2023-06-18 DIAGNOSIS — F331 Major depressive disorder, recurrent, moderate: Secondary | ICD-10-CM | POA: Diagnosis not present

## 2023-06-18 DIAGNOSIS — Z6830 Body mass index (BMI) 30.0-30.9, adult: Secondary | ICD-10-CM | POA: Diagnosis not present

## 2023-06-19 DIAGNOSIS — E039 Hypothyroidism, unspecified: Secondary | ICD-10-CM | POA: Diagnosis not present

## 2023-06-19 DIAGNOSIS — D649 Anemia, unspecified: Secondary | ICD-10-CM | POA: Diagnosis not present

## 2023-07-25 DIAGNOSIS — I10 Essential (primary) hypertension: Secondary | ICD-10-CM | POA: Diagnosis not present

## 2023-07-25 DIAGNOSIS — D649 Anemia, unspecified: Secondary | ICD-10-CM | POA: Diagnosis not present

## 2023-07-25 DIAGNOSIS — E039 Hypothyroidism, unspecified: Secondary | ICD-10-CM | POA: Diagnosis not present

## 2023-07-25 DIAGNOSIS — I4821 Permanent atrial fibrillation: Secondary | ICD-10-CM | POA: Diagnosis not present

## 2023-08-05 DIAGNOSIS — D519 Vitamin B12 deficiency anemia, unspecified: Secondary | ICD-10-CM | POA: Diagnosis not present

## 2023-08-05 DIAGNOSIS — E039 Hypothyroidism, unspecified: Secondary | ICD-10-CM | POA: Diagnosis not present

## 2023-08-05 DIAGNOSIS — D529 Folate deficiency anemia, unspecified: Secondary | ICD-10-CM | POA: Diagnosis not present

## 2023-08-05 DIAGNOSIS — D649 Anemia, unspecified: Secondary | ICD-10-CM | POA: Diagnosis not present

## 2023-08-23 DIAGNOSIS — I10 Essential (primary) hypertension: Secondary | ICD-10-CM | POA: Diagnosis not present

## 2023-08-23 DIAGNOSIS — E039 Hypothyroidism, unspecified: Secondary | ICD-10-CM | POA: Diagnosis not present

## 2023-10-24 DIAGNOSIS — I10 Essential (primary) hypertension: Secondary | ICD-10-CM | POA: Diagnosis not present

## 2023-10-24 DIAGNOSIS — I4821 Permanent atrial fibrillation: Secondary | ICD-10-CM | POA: Diagnosis not present

## 2023-10-25 DIAGNOSIS — R32 Unspecified urinary incontinence: Secondary | ICD-10-CM | POA: Diagnosis not present

## 2023-10-25 DIAGNOSIS — I10 Essential (primary) hypertension: Secondary | ICD-10-CM | POA: Diagnosis not present

## 2023-11-22 DIAGNOSIS — I10 Essential (primary) hypertension: Secondary | ICD-10-CM | POA: Diagnosis not present

## 2023-11-22 DIAGNOSIS — R32 Unspecified urinary incontinence: Secondary | ICD-10-CM | POA: Diagnosis not present

## 2023-12-23 DIAGNOSIS — I10 Essential (primary) hypertension: Secondary | ICD-10-CM | POA: Diagnosis not present

## 2023-12-23 DIAGNOSIS — R32 Unspecified urinary incontinence: Secondary | ICD-10-CM | POA: Diagnosis not present

## 2024-01-22 DIAGNOSIS — R32 Unspecified urinary incontinence: Secondary | ICD-10-CM | POA: Diagnosis not present

## 2024-01-22 DIAGNOSIS — I10 Essential (primary) hypertension: Secondary | ICD-10-CM | POA: Diagnosis not present

## 2024-02-21 DIAGNOSIS — I10 Essential (primary) hypertension: Secondary | ICD-10-CM | POA: Diagnosis not present

## 2024-02-21 DIAGNOSIS — R32 Unspecified urinary incontinence: Secondary | ICD-10-CM | POA: Diagnosis not present

## 2024-06-04 DIAGNOSIS — H01004 Unspecified blepharitis left upper eyelid: Secondary | ICD-10-CM | POA: Diagnosis not present

## 2024-06-04 DIAGNOSIS — H01001 Unspecified blepharitis right upper eyelid: Secondary | ICD-10-CM | POA: Diagnosis not present

## 2024-06-04 DIAGNOSIS — H01002 Unspecified blepharitis right lower eyelid: Secondary | ICD-10-CM | POA: Diagnosis not present

## 2024-06-04 DIAGNOSIS — H25813 Combined forms of age-related cataract, bilateral: Secondary | ICD-10-CM | POA: Diagnosis not present

## 2024-06-15 DIAGNOSIS — R790 Abnormal level of blood mineral: Secondary | ICD-10-CM | POA: Diagnosis not present

## 2024-06-15 DIAGNOSIS — E559 Vitamin D deficiency, unspecified: Secondary | ICD-10-CM | POA: Diagnosis not present

## 2024-06-15 DIAGNOSIS — E7849 Other hyperlipidemia: Secondary | ICD-10-CM | POA: Diagnosis not present

## 2024-06-15 DIAGNOSIS — D759 Disease of blood and blood-forming organs, unspecified: Secondary | ICD-10-CM | POA: Diagnosis not present

## 2024-06-15 DIAGNOSIS — E876 Hypokalemia: Secondary | ICD-10-CM | POA: Diagnosis not present

## 2024-06-15 DIAGNOSIS — E039 Hypothyroidism, unspecified: Secondary | ICD-10-CM | POA: Diagnosis not present

## 2024-06-23 DIAGNOSIS — E782 Mixed hyperlipidemia: Secondary | ICD-10-CM | POA: Diagnosis not present

## 2024-06-23 DIAGNOSIS — Z6829 Body mass index (BMI) 29.0-29.9, adult: Secondary | ICD-10-CM | POA: Diagnosis not present

## 2024-06-23 DIAGNOSIS — E7849 Other hyperlipidemia: Secondary | ICD-10-CM | POA: Diagnosis not present

## 2024-06-23 DIAGNOSIS — Z23 Encounter for immunization: Secondary | ICD-10-CM | POA: Diagnosis not present

## 2024-06-23 DIAGNOSIS — I10 Essential (primary) hypertension: Secondary | ICD-10-CM | POA: Diagnosis not present

## 2024-06-23 DIAGNOSIS — E559 Vitamin D deficiency, unspecified: Secondary | ICD-10-CM | POA: Diagnosis not present

## 2024-06-23 DIAGNOSIS — E039 Hypothyroidism, unspecified: Secondary | ICD-10-CM | POA: Diagnosis not present

## 2024-06-23 DIAGNOSIS — Z0001 Encounter for general adult medical examination with abnormal findings: Secondary | ICD-10-CM | POA: Diagnosis not present

## 2024-06-29 DIAGNOSIS — H25812 Combined forms of age-related cataract, left eye: Secondary | ICD-10-CM | POA: Diagnosis not present

## 2024-07-01 NOTE — H&P (Signed)
 Surgical History & Physical  Patient Name: Debbie Rubio  DOB: [birth year]  Surgery: Cataract extraction with intraocular lens implant phacoemulsification; Left Eye Surgeon: Lynwood Hermann MD Surgery Date: 07/06/2024 Pre-Op Date: 06/04/2024  HPI: A 71 Yr. old female patient 1. The patient is a new patient here today for Cataract Evaluation. The patient complains of difficulty when reading fine print, books, newspaper, instructions etc., which began 6 months ago. Both eyes are affected. The episode is gradual. The patient describes foggy and hazy symptoms affecting their eyes/vision. This is negatively affecting the patient's quality of life and the patient is unable to function adequately in life with the current level of vision. HPI was performed by Lynwood Hermann .  Medical History: Cataracts  Heart Problem Stroke Thyroid  Problems  Review of Systems Cardiovascular a-fib Endocrine thyroid  disease Neurological Stroke All recorded systems are negative except as noted above.  Social Never Smoked  Medication Prednisolone-moxiflox-bromfen,  Levothyroxine , Myrbetriq, Eliquis , Pindolol   Sx/Procedures None  Drug Allergies  NKDA  History & Physical: Heent: cataracts NECK: supple without bruits LUNGS: lungs clear to auscultation CV: regular rate and rhythm Abdomen: soft and non-tender  Impression & Plan: Assessment: 1.  COMBINED FORMS AGE RELATED CATARACT; Both Eyes (H25.813) 2.  BLEPHARITIS; Right Lower Lid, Left Upper Lid, Left Lower Lid, Right Upper Lid (H01.001, H01.002,H01.004,H01.005) 3.  DERMATOCHALASIS, no surgery; Right Upper Lid, Left Upper Lid (H02.831, Y97.165)  Plan: 1.  Cataract accounts for the patient's decreased vision. This visual impairment is not correctable with a tolerable change in glasses or contact lenses. Cataract surgery with an implantation of a new lens should significantly improve the visual and functional status of the patient. Recommend  phacoemulsification with intraocular lens. Discussed all risks, benefits, alternatives, and potential complications. Discussed the procedures and recovery. The patient desires to have surgery. A-scan/Biometry ordered and will be performed for intraocular lens calculations. The surgery will be performed in order to improve vision for driving, reading, and for eye examinations. Recommend Dextenza  for post-operative pain and inflammation. Educational materials provided: Cataract. History of corneal refractive Surgery: None History of Previous Ocular Surgery (PPV, other): None- pt states she had drainage holes in eyes, but no evidence of that seen today. History of ocular trauma: None Use of Eye Pressure Lowering Drops: None Pupil Status: Dilates poorly - shugarcaine or Lidocaine +Omidira by protocol, Malyugin Ring Left Eye worse. OS first, then OD. Standard IOL OU. Will pick IOL for OD as well - patient has dementia and there is a chance that we will require general anesthesia.  2.  Blepharitis is present - recommend regular lid cleaning.  3.  Asymptomatic, recommend observation for now. Findings, prognosis and treatment options reviewed.

## 2024-07-02 ENCOUNTER — Encounter (HOSPITAL_COMMUNITY)
Admission: RE | Admit: 2024-07-02 | Discharge: 2024-07-02 | Disposition: A | Discharge status: Home / Self Care | Source: Ambulatory Visit | Attending: Ophthalmology | Admitting: Ophthalmology

## 2024-07-02 NOTE — Pre-Procedure Instructions (Signed)
 Attempted pre-op phonecall. Left VM for her to call us  back.

## 2024-07-03 ENCOUNTER — Encounter (HOSPITAL_COMMUNITY): Payer: Self-pay

## 2024-07-06 ENCOUNTER — Encounter (HOSPITAL_COMMUNITY)
Admission: RE | Disposition: A | Discharge status: Home / Self Care | Payer: Self-pay | Source: Home / Self Care | Attending: Ophthalmology

## 2024-07-06 ENCOUNTER — Encounter (HOSPITAL_COMMUNITY): Payer: Self-pay | Admitting: Ophthalmology

## 2024-07-06 ENCOUNTER — Ambulatory Visit (HOSPITAL_COMMUNITY): Admitting: Anesthesiology

## 2024-07-06 ENCOUNTER — Ambulatory Visit (HOSPITAL_COMMUNITY)
Admission: RE | Admit: 2024-07-06 | Discharge: 2024-07-06 | Disposition: A | Discharge status: Home / Self Care | Attending: Ophthalmology | Admitting: Ophthalmology

## 2024-07-06 DIAGNOSIS — H2181 Floppy iris syndrome: Secondary | ICD-10-CM | POA: Insufficient documentation

## 2024-07-06 DIAGNOSIS — Z87891 Personal history of nicotine dependence: Secondary | ICD-10-CM

## 2024-07-06 DIAGNOSIS — H25812 Combined forms of age-related cataract, left eye: Secondary | ICD-10-CM | POA: Insufficient documentation

## 2024-07-06 DIAGNOSIS — I10 Essential (primary) hypertension: Secondary | ICD-10-CM | POA: Diagnosis not present

## 2024-07-06 DIAGNOSIS — K219 Gastro-esophageal reflux disease without esophagitis: Secondary | ICD-10-CM | POA: Diagnosis not present

## 2024-07-06 DIAGNOSIS — E039 Hypothyroidism, unspecified: Secondary | ICD-10-CM | POA: Diagnosis not present

## 2024-07-06 DIAGNOSIS — H5712 Ocular pain, left eye: Secondary | ICD-10-CM | POA: Insufficient documentation

## 2024-07-06 DIAGNOSIS — F0390 Unspecified dementia without behavioral disturbance: Secondary | ICD-10-CM | POA: Insufficient documentation

## 2024-07-06 DIAGNOSIS — Z79890 Hormone replacement therapy (postmenopausal): Secondary | ICD-10-CM | POA: Diagnosis not present

## 2024-07-06 HISTORY — PX: CATARACT EXTRACTION W/PHACO: SHX586

## 2024-07-06 HISTORY — PX: INSERTION, STENT, DRUG-ELUTING, LACRIMAL CANALICULUS: SHX7453

## 2024-07-06 SURGERY — PHACOEMULSIFICATION, CATARACT, WITH IOL INSERTION
Anesthesia: Monitor Anesthesia Care | Site: Eye | Laterality: Left

## 2024-07-06 MED ORDER — LACTATED RINGERS IV SOLN: INTRAVENOUS | Status: DC

## 2024-07-06 MED ORDER — PHENYLEPHRINE-KETOROLAC 1-0.3 % IO SOLN
INTRAOCULAR | Status: DC | PRN
  Administered 2024-07-06: 500 mL via OPHTHALMIC

## 2024-07-06 MED ORDER — POVIDONE-IODINE 5 % OP SOLN
OPHTHALMIC | Status: DC | PRN
  Administered 2024-07-06: 1 via OPHTHALMIC

## 2024-07-06 MED ORDER — PHENYLEPHRINE HCL 2.5 % OP SOLN
1.0000 [drp] | OPHTHALMIC | Status: AC | PRN
  Administered 2024-07-06 (×3): 1 [drp] via OPHTHALMIC

## 2024-07-06 MED ORDER — TROPICAMIDE 1 % OP SOLN
1.0000 [drp] | OPHTHALMIC | Status: AC | PRN
  Administered 2024-07-06 (×3): 1 [drp] via OPHTHALMIC

## 2024-07-06 MED ORDER — SODIUM HYALURONATE 23MG/ML IO SOSY
PREFILLED_SYRINGE | INTRAOCULAR | Status: DC | PRN
Start: 2024-07-06 — End: 2024-07-06
  Administered 2024-07-06: .6 mL via INTRAOCULAR

## 2024-07-06 MED ORDER — STERILE WATER FOR IRRIGATION IR SOLN
Status: DC | PRN
  Administered 2024-07-06: 1

## 2024-07-06 MED ORDER — LIDOCAINE HCL (PF) 1 % IJ SOLN
INTRAMUSCULAR | Status: DC | PRN
  Administered 2024-07-06: 1 mL

## 2024-07-06 MED ORDER — BSS IO SOLN
INTRAOCULAR | Status: DC | PRN
  Administered 2024-07-06: 15 mL via INTRAOCULAR

## 2024-07-06 MED ORDER — DEXAMETHASONE 0.4 MG OP INST
VAGINAL_INSERT | OPHTHALMIC | Status: DC | PRN
  Administered 2024-07-06: .4 mg via OPHTHALMIC

## 2024-07-06 MED ORDER — LIDOCAINE HCL 3.5 % OP GEL
1.0000 | Freq: Once | OPHTHALMIC | Status: AC
  Administered 2024-07-06: 1 via OPHTHALMIC

## 2024-07-06 MED ORDER — DEXAMETHASONE 0.4 MG OP INST
VAGINAL_INSERT | OPHTHALMIC | Status: AC
  Filled 2024-07-06: qty 1

## 2024-07-06 MED ORDER — SODIUM HYALURONATE 10 MG/ML IO SOLUTION
PREFILLED_SYRINGE | INTRAOCULAR | Status: DC | PRN
  Administered 2024-07-06: .85 mL via INTRAOCULAR

## 2024-07-06 MED ORDER — TETRACAINE HCL 0.5 % OP SOLN
1.0000 [drp] | OPHTHALMIC | Status: AC | PRN
  Administered 2024-07-06 (×3): 1 [drp] via OPHTHALMIC

## 2024-07-06 SURGICAL SUPPLY — 13 items
CLOTH BEACON ORANGE TIMEOUT ST (SAFETY) ×3 IMPLANT
EYE SHIELD UNIVERSAL CLEAR (GAUZE/BANDAGES/DRESSINGS) IMPLANT
FEE CATARACT SUITE SIGHTPATH (MISCELLANEOUS) ×3 IMPLANT
GLOVE BIOGEL PI IND STRL 7.0 (GLOVE) ×6 IMPLANT
LENS IOL TECNIS EYHANCE 21.5 (Intraocular Lens) IMPLANT
NDL HYPO 18GX1.5 BLUNT FILL (NEEDLE) ×3 IMPLANT
NEEDLE HYPO 18GX1.5 BLUNT FILL (NEEDLE) ×2 IMPLANT
PAD ARMBOARD POSITIONER FOAM (MISCELLANEOUS) ×3 IMPLANT
RING MALYGIN 7.0 (MISCELLANEOUS) IMPLANT
SYR TB 1ML LL NO SAFETY (SYRINGE) ×3 IMPLANT
TAPE SURG TRANSPORE 1 IN (GAUZE/BANDAGES/DRESSINGS) IMPLANT
TIP IRRIGATON/ASPIRATION (MISCELLANEOUS) IMPLANT
WATER STERILE IRR 250ML POUR (IV SOLUTION) ×3 IMPLANT

## 2024-07-06 NOTE — Interval H&P Note (Signed)
 History and Physical Interval Note:  07/06/2024 10:53 AM  Debbie Rubio  has presented today for surgery, with the diagnosis of combined forms age related cataract, left eye.  The various methods of treatment have been discussed with the patient and family. After consideration of risks, benefits and other options for treatment, the patient has consented to  Procedure(s) with comments: PHACOEMULSIFICATION, CATARACT, WITH IOL INSERTION (Left) - This patient could turn into general and surgery in both eyes per Dr. Harrie.  She has dementia and he wants to wait to see how patient does. as a surgical intervention.  The patient's history has been reviewed, patient examined, no change in status, stable for surgery.  I have reviewed the patient's chart and labs.  Questions were answered to the patient's satisfaction.     HARRIE AGENT

## 2024-07-06 NOTE — Discharge Instructions (Signed)
 Please discharge patient when stable, will follow up today with Dr. June Leap at the [facility] office immediately following discharge.  Leave shield in place until visit.  All paperwork with discharge instructions will be given at the office.  [facility] Address:  [address]  [place], [place] [address]

## 2024-07-06 NOTE — Transfer of Care (Signed)
 Immediate Anesthesia Transfer of Care Note  Patient: Debbie Rubio  Procedure(s) Performed: PHACOEMULSIFICATION, CATARACT, WITH IOL INSERTION (Left) INSERTION, STENT, DRUG-ELUTING, LACRIMAL CANALICULUS (Left: Eye)  Patient Location: PACU  Anesthesia Type:MAC  Level of Consciousness: awake, alert , and oriented  Airway & Oxygen Therapy: Patient Spontanous Breathing  Post-op Assessment: Report given to RN and Post -op Vital signs reviewed and stable  Post vital signs: Reviewed and stable  Last Vitals:  Vitals Value Taken Time  BP    Temp    Pulse    Resp    SpO2      Last Pain:  Vitals:   07/06/24 1026  TempSrc: Oral  PainSc: 0-No pain         Complications: No notable events documented.

## 2024-07-06 NOTE — Op Note (Signed)
 Date of procedure: 07/06/24  Pre-operative diagnosis: Visually significant age-related combined cataract, Left Eye (H25.812), Poor dilation of the Left eye  Post-operative diagnosis:  Visually significant age-related combined cataract, Left Eye (H25.812) Intra-operative floppy iris syndrome, Left eye (H21.81) 3.   Pain and inflammation following cataract surgery, Left Eye (H57.12)  Procedure:  Complex Removal of cataract via phacoemulsification and insertion of intra-ocular lens [employer] DIB00 +21.5D into the capsular bag of the Left Eye [id number] 2. Placement of Dextenza  Implant, Left Lower Lid  Attending surgeon: Lynwood LABOR. Yonatan Guitron, MD, MA  Anesthesia: MAC, Topical Akten  Complications: None  Estimated Blood Loss: <32mL (minimal)  Specimens: None  Implants: As above  Indications:  Visually significant age-related cataract, Left Eye  Procedure:  The patient was seen and identified in the pre-operative area. The operative eye was identified and dilated.  The operative eye was marked.  Topical anesthesia was administered to the operative eye.     The patient was then to the operative suite and placed in the supine position.  A timeout was performed confirming the patient, procedure to be performed, and all other relevant information.   The patient's face was prepped and draped in the usual fashion for intra-ocular surgery.  A lid speculum was placed into the operative eye and the surgical microscope moved into place and focused. Poor dilation of the iris was confirmed. An inferotemporal paracentesis was created using a 20 gauge paracentesis blade.  Shugarcaine was injected into the anterior chamber.  Viscoelastic was injected into the anterior chamber.  A temporal clear-corneal main wound incision was created using a 2.67mm microkeratome.  A 7.29mm Malyugin ring was placed. A continuous curvilinear capsulorrhexis was initiated using an irrigating cystitome and completed using  capsulorrhexis forceps.  Hydrodissection and hydrodeliniation were performed.  Viscoelastic was injected into the anterior chamber.  A phacoemulsification handpiece and a chopper as a second instrument were used to remove the nucleus and epinucleus. The irrigation/aspiration handpiece was used to remove any remaining cortical material.   The capsular bag was reinflated with viscoelastic, checked, and found to be intact.  The intraocular lens was inserted into the capsular bag. The Malyugin ring was removed. The irrigation/aspiration handpiece was used to remove any remaining viscoelastic.  The clear corneal wound and paracentesis wounds were then hydrated and checked with Weck-Cels to be watertight. The lid-speculum was removed.    The lower canaliculus was dilated and filled with Provisc. A Dextenza  implant was placed in the lower canaliculus without complication.  The drape was removed.  The patient's face was cleaned with a wet and dry 4x4.   A clear shield was taped over the eye. The patient was taken to the post-operative care unit in good condition, having tolerated the procedure well.  Post-Op Instructions: The patient will follow up at [facility] for a same day post-operative evaluation and will receive all other orders and instructions.

## 2024-07-06 NOTE — Anesthesia Postprocedure Evaluation (Signed)
 Anesthesia Post Note  Patient: Debbie Rubio  Procedure(s) Performed: PHACOEMULSIFICATION, CATARACT, WITH IOL INSERTION (Left) INSERTION, STENT, DRUG-ELUTING, LACRIMAL CANALICULUS (Left: Eye)  Patient location during evaluation: Phase II Anesthesia Type: MAC Level of consciousness: awake Pain management: pain level controlled Vital Signs Assessment: post-procedure vital signs reviewed and stable Respiratory status: spontaneous breathing and respiratory function stable Cardiovascular status: blood pressure returned to baseline and stable Postop Assessment: no headache and no apparent nausea or vomiting Anesthetic complications: no Comments: Late entry   No notable events documented.   Last Vitals:  Vitals:   07/06/24 1026 07/06/24 1129  BP: (!) 169/83 (!) 166/71  Pulse: 77 77  Temp: 36.7 C 37.1 C  SpO2: 100% 98%    Last Pain:  Vitals:   07/06/24 1129  TempSrc: Oral  PainSc:                  Debbie Rubio

## 2024-07-06 NOTE — Anesthesia Preprocedure Evaluation (Signed)
 Anesthesia Evaluation  Patient identified by MRN, date of birth, ID band Patient awake    Reviewed: Allergy & Precautions, H&P , NPO status , Patient's Chart, lab work & pertinent test results, reviewed documented beta blocker date and time   Airway Mallampati: II  TM Distance: >3 FB Neck ROM: full    Dental no notable dental hx. (+) Upper Dentures, Lower Dentures   Pulmonary shortness of breath, pneumonia, former smoker   Pulmonary exam normal breath sounds clear to auscultation       Cardiovascular Exercise Tolerance: Good hypertension,  Rhythm:regular Rate:Normal     Neuro/Psych CVA  negative psych ROS   GI/Hepatic Neg liver ROS,GERD  ,,  Endo/Other  Hypothyroidism    Renal/GU Renal disease  negative genitourinary   Musculoskeletal   Abdominal   Peds  Hematology  (+) Blood dyscrasia, anemia   Anesthesia Other Findings   Reproductive/Obstetrics negative OB ROS                              Anesthesia Physical Anesthesia Plan  ASA: 3  Anesthesia Plan: MAC   Post-op Pain Management:    Induction:   PONV Risk Score and Plan:   Airway Management Planned:   Additional Equipment:   Intra-op Plan:   Post-operative Plan:   Informed Consent: I have reviewed the patients History and Physical, chart, labs and discussed the procedure including the risks, benefits and alternatives for the proposed anesthesia with the patient or authorized representative who has indicated his/her understanding and acceptance.     Dental Advisory Given  Plan Discussed with: CRNA  Anesthesia Plan Comments: (Will attempt one eye with MAC, if pt doesn't tolerate it --> GA)        Anesthesia Quick Evaluation

## 2024-07-13 DIAGNOSIS — H25811 Combined forms of age-related cataract, right eye: Secondary | ICD-10-CM | POA: Diagnosis not present

## 2024-07-14 ENCOUNTER — Encounter (HOSPITAL_COMMUNITY)
Admission: RE | Admit: 2024-07-14 | Discharge: 2024-07-14 | Disposition: A | Discharge status: Home / Self Care | Source: Ambulatory Visit | Attending: Ophthalmology | Admitting: Ophthalmology

## 2024-07-14 NOTE — Pre-Procedure Instructions (Signed)
 Attempted pre-op phonecall. Left VM for her to call us  back.

## 2024-07-15 NOTE — H&P (Signed)
 Surgical History & Physical  Patient Name: Debbie Rubio  DOB: 1940/12/30  Surgery: Cataract extraction with intraocular lens implant phacoemulsification; Right Eye Surgeon: Lynwood Hermann MD Surgery Date: 07/20/2024 Pre-Op Date: 07/13/2024  HPI: A 12 Yr. old female patient 1. The patient is returning after cataract surgery. The left eye is affected. Status post cataract surgery, which began 1 week ago: Since the last visit, the affected area feels improvement. The patient's vision is improved. The condition's severity is constant. Patient is following medication instructions. 2. The patient is returning for a cataract follow-up of the right eye. Since the last visit, the affected area is tolerating. The patient's vision is blurry. The condition's severity is constant. Patient is not taking medications. This is negatively affecting the patient's quality of life and the patient is unable to function adequately in life with the current level of vision. HPI Completed by Dr. Lynwood Hermann  Medical History: Cataracts  Heart Problem Stroke Thyroid  Problems  Review of Systems Cardiovascular a-fib Endocrine thyroid  disease Neurological Stroke All recorded systems are negative except as noted above.  Social Never Smoked  Medication Prednisolone-moxiflox-bromfen,  Levothyroxine , Myrbetriq, Eliquis , Pindolol   Sx/Procedures Phaco c IOL OS - Dextenza   Drug Allergies  Pseudoephedrine, Metoprolol, Levaquin, Flecainide, Beta-Blockers (Beta-Adrenergic Blocking Agts)   History & Physical: Heent: cataract NECK: supple without bruits LUNGS: lungs clear to auscultation CV: regular rate and rhythm Abdomen: soft and non-tender  Impression & Plan: Assessment: 1.  CATARACT EXTRACTION STATUS; Left Eye (Z98.42) 2.  COMBINED FORMS AGE RELATED CATARACT; Right Eye (H25.811)  Plan: 1.  1 week after cataract surgery. Doing well with improved vision and normal eye pressure. Call with any problems  or concerns. Continue Pred-Mox-Brom Combo drop 2x/day for 1 more week.  2.  Cataract accounts for the patient's decreased vision. This visual impairment is not correctable with a tolerable change in glasses or contact lenses. Cataract surgery with an implantation of a new lens should significantly improve the visual and functional status of the patient. Recommend phacoemulsification with intraocular lens. Discussed all risks, benefits, alternatives, and potential complications. Discussed the procedures and recovery. The patient desires to have surgery. A-scan/Biometry ordered and will be performed for intraocular lens calculations. The surgery will be performed in order to improve vision for driving, reading, and for eye examinations. Recommend Dextenza  for post-operative pain and inflammation. Educational materials provided: Cataract. History of corneal refractive Surgery: None History of ocular trauma: None Use of Eye Pressure Lowering Drops: None Pupil Status: Dilates poorly - shugarcaine or Lidocaine +Omidira by protocol, Malyugin Ring Right Eye. Standard IOL OU. History of Previous Ocular Surgery (PPV, other): None

## 2024-07-15 NOTE — Pre-Procedure Instructions (Signed)
 Attempted pre-op phonecall. Left VM for her to call us  back.

## 2024-07-16 ENCOUNTER — Encounter (HOSPITAL_COMMUNITY): Payer: Self-pay

## 2024-07-20 ENCOUNTER — Ambulatory Visit (HOSPITAL_COMMUNITY)
Admission: RE | Admit: 2024-07-20 | Discharge: 2024-07-20 | Disposition: A | Discharge status: Home / Self Care | Attending: Ophthalmology | Admitting: Ophthalmology

## 2024-07-20 ENCOUNTER — Ambulatory Visit (HOSPITAL_COMMUNITY): Admitting: Certified Registered"

## 2024-07-20 ENCOUNTER — Encounter (HOSPITAL_COMMUNITY)
Admission: RE | Disposition: A | Discharge status: Home / Self Care | Payer: Self-pay | Source: Home / Self Care | Attending: Ophthalmology

## 2024-07-20 ENCOUNTER — Encounter (HOSPITAL_COMMUNITY): Payer: Self-pay | Admitting: Ophthalmology

## 2024-07-20 DIAGNOSIS — Z961 Presence of intraocular lens: Secondary | ICD-10-CM | POA: Insufficient documentation

## 2024-07-20 DIAGNOSIS — H25811 Combined forms of age-related cataract, right eye: Secondary | ICD-10-CM | POA: Diagnosis not present

## 2024-07-20 DIAGNOSIS — Z9842 Cataract extraction status, left eye: Secondary | ICD-10-CM | POA: Insufficient documentation

## 2024-07-20 DIAGNOSIS — I10 Essential (primary) hypertension: Secondary | ICD-10-CM | POA: Insufficient documentation

## 2024-07-20 DIAGNOSIS — I4891 Unspecified atrial fibrillation: Secondary | ICD-10-CM | POA: Diagnosis not present

## 2024-07-20 DIAGNOSIS — E039 Hypothyroidism, unspecified: Secondary | ICD-10-CM | POA: Insufficient documentation

## 2024-07-20 DIAGNOSIS — Z8673 Personal history of transient ischemic attack (TIA), and cerebral infarction without residual deficits: Secondary | ICD-10-CM | POA: Diagnosis not present

## 2024-07-20 DIAGNOSIS — Z87891 Personal history of nicotine dependence: Secondary | ICD-10-CM

## 2024-07-20 HISTORY — PX: CATARACT EXTRACTION W/PHACO: SHX586

## 2024-07-20 SURGERY — PHACOEMULSIFICATION, CATARACT, WITH IOL INSERTION
Anesthesia: Monitor Anesthesia Care | Site: Eye | Laterality: Right

## 2024-07-20 MED ORDER — TETRACAINE HCL 0.5 % OP SOLN
1.0000 [drp] | OPHTHALMIC | Status: AC | PRN
  Administered 2024-07-20 (×3): 1 [drp] via OPHTHALMIC

## 2024-07-20 MED ORDER — LIDOCAINE HCL (PF) 1 % IJ SOLN
INTRAMUSCULAR | Status: DC | PRN
  Administered 2024-07-20: 1 mL

## 2024-07-20 MED ORDER — PHENYLEPHRINE-KETOROLAC 1-0.3 % IO SOLN
INTRAOCULAR | Status: DC | PRN
  Administered 2024-07-20: 500 mL via OPHTHALMIC

## 2024-07-20 MED ORDER — PHENYLEPHRINE HCL 2.5 % OP SOLN
1.0000 [drp] | OPHTHALMIC | Status: AC | PRN
  Administered 2024-07-20 (×3): 1 [drp] via OPHTHALMIC

## 2024-07-20 MED ORDER — POVIDONE-IODINE 5 % OP SOLN
OPHTHALMIC | Status: DC | PRN
  Administered 2024-07-20: 1 via OPHTHALMIC

## 2024-07-20 MED ORDER — SODIUM HYALURONATE 23MG/ML IO SOSY
PREFILLED_SYRINGE | INTRAOCULAR | Status: DC | PRN
  Administered 2024-07-20: .6 mL via INTRAOCULAR

## 2024-07-20 MED ORDER — LIDOCAINE HCL 3.5 % OP GEL
1.0000 | Freq: Once | OPHTHALMIC | Status: AC
  Administered 2024-07-20: 1 via OPHTHALMIC

## 2024-07-20 MED ORDER — LACTATED RINGERS IV SOLN: INTRAVENOUS | Status: DC

## 2024-07-20 MED ORDER — DEXAMETHASONE 0.4 MG OP INST
VAGINAL_INSERT | OPHTHALMIC | Status: AC
  Filled 2024-07-20: qty 1

## 2024-07-20 MED ORDER — BSS IO SOLN
INTRAOCULAR | Status: DC | PRN
  Administered 2024-07-20: 15 mL via INTRAOCULAR

## 2024-07-20 MED ORDER — SODIUM HYALURONATE 10 MG/ML IO SOLUTION
PREFILLED_SYRINGE | INTRAOCULAR | Status: DC | PRN
Start: 2024-07-20 — End: 2024-07-20
  Administered 2024-07-20: .85 mL via INTRAOCULAR

## 2024-07-20 MED ORDER — TROPICAMIDE 1 % OP SOLN
1.0000 [drp] | OPHTHALMIC | Status: AC | PRN
  Administered 2024-07-20 (×3): 1 [drp] via OPHTHALMIC

## 2024-07-20 MED ORDER — STERILE WATER FOR IRRIGATION IR SOLN
Status: DC | PRN
  Administered 2024-07-20: 1

## 2024-07-20 SURGICAL SUPPLY — 12 items
CLOTH BEACON ORANGE TIMEOUT ST (SAFETY) ×3 IMPLANT
EYE SHIELD UNIVERSAL CLEAR (GAUZE/BANDAGES/DRESSINGS) ×1 IMPLANT
FEE CATARACT SUITE SIGHTPATH (MISCELLANEOUS) ×3 IMPLANT
GLOVE BIOGEL PI IND STRL 7.0 (GLOVE) ×6 IMPLANT
LENS IOL TECNIS EYHANCE 22.0 (Intraocular Lens) ×1 IMPLANT
NDL HYPO 18GX1.5 BLUNT FILL (NEEDLE) ×2 IMPLANT
NEEDLE HYPO 18GX1.5 BLUNT FILL (NEEDLE) ×2 IMPLANT
PAD ARMBOARD POSITIONER FOAM (MISCELLANEOUS) ×3 IMPLANT
SYR TB 1ML LL NO SAFETY (SYRINGE) ×3 IMPLANT
TAPE SURG TRANSPORE 1 IN (GAUZE/BANDAGES/DRESSINGS) ×1 IMPLANT
TIP IRRIGATON/ASPIRATION (MISCELLANEOUS) ×1 IMPLANT
WATER STERILE IRR 250ML POUR (IV SOLUTION) ×3 IMPLANT

## 2024-07-20 NOTE — Anesthesia Preprocedure Evaluation (Addendum)
 Anesthesia Evaluation  Patient identified by MRN, date of birth, ID band Patient confused    Reviewed: Allergy & Precautions, H&P , NPO status , Patient's Chart, lab work & pertinent test results  Airway Mallampati: II  TM Distance: >3 FB Neck ROM: Full    Dental no notable dental hx.    Pulmonary shortness of breath, pneumonia, former smoker   Pulmonary exam normal breath sounds clear to auscultation       Cardiovascular hypertension, + dysrhythmias Atrial Fibrillation  Rhythm:Irregular Rate:Normal     Neuro/Psych CVA  negative psych ROS   GI/Hepatic Neg liver ROS,GERD  ,,  Endo/Other  Hypothyroidism    Renal/GU Renal disease  negative genitourinary   Musculoskeletal  (+) Arthritis ,    Abdominal   Peds negative pediatric ROS (+)  Hematology  (+) Blood dyscrasia, anemia   Anesthesia Other Findings   Reproductive/Obstetrics negative OB ROS                              Anesthesia Physical Anesthesia Plan  ASA: 3  Anesthesia Plan: MAC   Post-op Pain Management:    Induction:   PONV Risk Score and Plan:   Airway Management Planned: Nasal Cannula  Additional Equipment:   Intra-op Plan:   Post-operative Plan:   Informed Consent: I have reviewed the patients History and Physical, chart, labs and discussed the procedure including the risks, benefits and alternatives for the proposed anesthesia with the patient or authorized representative who has indicated his/her understanding and acceptance.     Dental advisory given  Plan Discussed with: CRNA  Anesthesia Plan Comments:          Anesthesia Quick Evaluation

## 2024-07-20 NOTE — Op Note (Signed)
 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 Tylie Golonka, MD, MA  Anesthesia: MAC, Topical Akten  Complications: None  Estimated Blood Loss: <53mL (minimal)  Specimens: None  Implants: As above  Indications:  Visually significant age-related cataract, Right Eye  Procedure:  The patient was seen and identified in the pre-operative area. The operative eye was identified and dilated.  The operative eye was marked.  Topical anesthesia was administered to the operative eye.     The patient was then to the operative suite and placed in the supine position.  A timeout was performed confirming the patient, procedure to be performed, and all other relevant information.   The patient's face was prepped and draped in the usual fashion for intra-ocular surgery.  A lid speculum was placed into the operative eye and the surgical microscope moved into place and focused.  A superotemporal paracentesis was created using a 20 gauge paracentesis blade. Omidria was injected into the anterior chamber. Shugarcaine was injected into the anterior chamber.  Viscoelastic was injected into the anterior chamber.  A temporal clear-corneal main wound incision was created using a 2.61mm microkeratome.  A continuous curvilinear capsulorrhexis was initiated using an irrigating cystitome and completed using capsulorrhexis forceps.  Hydrodissection and hydrodeliniation were performed.  Viscoelastic was injected into the anterior chamber.  A phacoemulsification handpiece and a chopper as a second instrument were used to remove the nucleus and epinucleus.  The irrigation/aspiration handpiece was used to remove any remaining cortical material.   The capsular bag was reinflated with viscoelastic, checked, and found to be intact.  The intraocular lens was inserted into the capsular bag.  The irrigation/aspiration handpiece was used to remove any remaining viscoelastic.  The clear corneal wound and paracentesis wounds were then hydrated and checked with Weck-Cels to be watertight. The lid-speculum was removed.  The drape was removed.  The patient's face was cleaned with a wet and dry 4x4. A clear shield was taped over the eye. The patient was taken to the post-operative care unit in good condition, having tolerated the procedure well.  Post-Op Instructions: The patient will follow up at [facility] for a same day post-operative evaluation and will receive all other orders and instructions.

## 2024-07-20 NOTE — Discharge Instructions (Signed)
 Please discharge patient when stable, will follow up today with Dr. June Leap at the [facility] office immediately following discharge.  Leave shield in place until visit.  All paperwork with discharge instructions will be given at the office.  [facility] Address:  [address]  [place], [place] [address]

## 2024-07-20 NOTE — Interval H&P Note (Signed)
 History and Physical Interval Note:  07/20/2024 9:11 AM  Debbie Rubio  has presented today for surgery, with the diagnosis of combined forms age related cataract, right eye.  The various methods of treatment have been discussed with the patient and family. After consideration of risks, benefits and other options for treatment, the patient has consented to  Procedure(s): PHACOEMULSIFICATION, CATARACT, WITH IOL INSERTION (Right) INSERTION, STENT, DRUG-ELUTING, LACRIMAL CANALICULUS (Right) as a surgical intervention.  The patient's history has been reviewed, patient examined, no change in status, stable for surgery.  I have reviewed the patient's chart and labs.  Questions were answered to the patient's satisfaction.     HARRIE AGENT

## 2024-07-20 NOTE — Anesthesia Postprocedure Evaluation (Signed)
 Anesthesia Post Note  Patient: Lexii A Truluck  Procedure(s) Performed: PHACOEMULSIFICATION, CATARACT, WITH IOL INSERTION (Right: Eye)  Patient location during evaluation: PACU Anesthesia Type: MAC Level of consciousness: awake and alert Pain management: pain level controlled Vital Signs Assessment: post-procedure vital signs reviewed and stable Respiratory status: spontaneous breathing, nonlabored ventilation, respiratory function stable and patient connected to nasal cannula oxygen Cardiovascular status: stable and blood pressure returned to baseline Postop Assessment: no apparent nausea or vomiting Anesthetic complications: no   No notable events documented.   Last Vitals:  Vitals:   07/20/24 0830 07/20/24 0943  BP: (!) 178/61 (!) 156/76  Pulse: 67 70  Resp: 18 18  Temp: 36.7 C 36.7 C  SpO2: 99% 100%    Last Pain:  Vitals:   07/20/24 0943  TempSrc: Oral  PainSc: 0-No pain                 Andrea Limes

## 2024-07-20 NOTE — Transfer of Care (Signed)
 Immediate Anesthesia Transfer of Care Note  Patient: Debbie Rubio  Procedure(s) Performed: PHACOEMULSIFICATION, CATARACT, WITH IOL INSERTION (Right: Eye)  Patient Location: Short Stay  Anesthesia Type:MAC  Level of Consciousness: awake, alert , oriented, and patient cooperative  Airway & Oxygen Therapy: Patient Spontanous Breathing  Post-op Assessment: Report given to RN and Post -op Vital signs reviewed and stable  Post vital signs: Reviewed and stable  Last Vitals:  Vitals Value Taken Time  BP 167/90   Temp 97.9   Pulse 58   Resp 16   SpO2 98     Last Pain:  Vitals:   07/20/24 0830  TempSrc: Oral  PainSc: 0-No pain      Patients Stated Pain Goal: 6 (07/20/24 0830)  Complications: No notable events documented.
